# Patient Record
Sex: Male | Born: 1960 | Race: Black or African American | Hispanic: No | Marital: Single | Smoking: Never smoker
Health system: Southern US, Community
[De-identification: ages and names within clinical notes are randomized; demographics above are authoritative.]

## PROBLEM LIST (undated history)

## (undated) DIAGNOSIS — C801 Malignant (primary) neoplasm, unspecified: Secondary | ICD-10-CM

## (undated) DIAGNOSIS — R569 Unspecified convulsions: Secondary | ICD-10-CM

## (undated) DIAGNOSIS — F319 Bipolar disorder, unspecified: Secondary | ICD-10-CM

## (undated) DIAGNOSIS — I10 Essential (primary) hypertension: Secondary | ICD-10-CM

## (undated) DIAGNOSIS — N189 Chronic kidney disease, unspecified: Secondary | ICD-10-CM

## (undated) DIAGNOSIS — F419 Anxiety disorder, unspecified: Secondary | ICD-10-CM

## (undated) DIAGNOSIS — R4189 Other symptoms and signs involving cognitive functions and awareness: Secondary | ICD-10-CM

---

## 2021-03-01 ENCOUNTER — Telehealth: Payer: Self-pay | Admitting: Family Medicine

## 2021-03-01 NOTE — Telephone Encounter (Signed)
error 

## 2021-04-20 ENCOUNTER — Encounter (HOSPITAL_COMMUNITY): Payer: Self-pay

## 2021-04-20 ENCOUNTER — Emergency Department (HOSPITAL_COMMUNITY): Payer: Medicaid Other

## 2021-04-20 ENCOUNTER — Emergency Department (HOSPITAL_COMMUNITY)
Admission: EM | Admit: 2021-04-20 | Discharge: 2021-04-20 | Disposition: A | Discharge status: Home / Self Care | Payer: Medicaid Other | Attending: Emergency Medicine | Admitting: Emergency Medicine

## 2021-04-20 DIAGNOSIS — R197 Diarrhea, unspecified: Secondary | ICD-10-CM | POA: Insufficient documentation

## 2021-04-20 DIAGNOSIS — Z85828 Personal history of other malignant neoplasm of skin: Secondary | ICD-10-CM | POA: Insufficient documentation

## 2021-04-20 DIAGNOSIS — N189 Chronic kidney disease, unspecified: Secondary | ICD-10-CM | POA: Diagnosis not present

## 2021-04-20 DIAGNOSIS — K5900 Constipation, unspecified: Secondary | ICD-10-CM | POA: Insufficient documentation

## 2021-04-20 DIAGNOSIS — I129 Hypertensive chronic kidney disease with stage 1 through stage 4 chronic kidney disease, or unspecified chronic kidney disease: Secondary | ICD-10-CM | POA: Insufficient documentation

## 2021-04-20 HISTORY — DX: Bipolar disorder, unspecified: F31.9

## 2021-04-20 HISTORY — DX: Anxiety disorder, unspecified: F41.9

## 2021-04-20 HISTORY — DX: Chronic kidney disease, unspecified: N18.9

## 2021-04-20 HISTORY — DX: Malignant (primary) neoplasm, unspecified: C80.1

## 2021-04-20 HISTORY — DX: Essential (primary) hypertension: I10

## 2021-04-20 HISTORY — DX: Unspecified convulsions: R56.9

## 2021-04-20 LAB — CBC WITH DIFFERENTIAL/PLATELET
Abs Immature Granulocytes: 0.01 10*3/uL (ref 0.00–0.07)
Basophils Absolute: 0 10*3/uL (ref 0.0–0.1)
Basophils Relative: 1 %
Eosinophils Absolute: 0.1 10*3/uL (ref 0.0–0.5)
Eosinophils Relative: 2 %
HCT: 42.1 % (ref 39.0–52.0)
Hemoglobin: 13.4 g/dL (ref 13.0–17.0)
Immature Granulocytes: 0 %
Lymphocytes Relative: 34 %
Lymphs Abs: 1.8 10*3/uL (ref 0.7–4.0)
MCH: 29.6 pg (ref 26.0–34.0)
MCHC: 31.8 g/dL (ref 30.0–36.0)
MCV: 92.9 fL (ref 80.0–100.0)
Monocytes Absolute: 0.5 10*3/uL (ref 0.1–1.0)
Monocytes Relative: 8 %
Neutro Abs: 2.9 10*3/uL (ref 1.7–7.7)
Neutrophils Relative %: 55 %
Platelets: 319 10*3/uL (ref 150–400)
RBC: 4.53 MIL/uL (ref 4.22–5.81)
RDW: 14.9 % (ref 11.5–15.5)
WBC: 5.3 10*3/uL (ref 4.0–10.5)
nRBC: 0 % (ref 0.0–0.2)

## 2021-04-20 LAB — URINALYSIS, ROUTINE W REFLEX MICROSCOPIC
Bilirubin Urine: NEGATIVE
Glucose, UA: NEGATIVE mg/dL
Hgb urine dipstick: NEGATIVE
Ketones, ur: NEGATIVE mg/dL
Leukocytes,Ua: NEGATIVE
Nitrite: NEGATIVE
Protein, ur: NEGATIVE mg/dL
Specific Gravity, Urine: 1.008 (ref 1.005–1.030)
pH: 7 (ref 5.0–8.0)

## 2021-04-20 LAB — COMPREHENSIVE METABOLIC PANEL
ALT: 25 U/L (ref 0–44)
AST: 27 U/L (ref 15–41)
Albumin: 4.2 g/dL (ref 3.5–5.0)
Alkaline Phosphatase: 153 U/L — ABNORMAL HIGH (ref 38–126)
Anion gap: 8 (ref 5–15)
BUN: 24 mg/dL — ABNORMAL HIGH (ref 8–23)
CO2: 24 mmol/L (ref 22–32)
Calcium: 8.8 mg/dL — ABNORMAL LOW (ref 8.9–10.3)
Chloride: 105 mmol/L (ref 98–111)
Creatinine, Ser: 1.57 mg/dL — ABNORMAL HIGH (ref 0.61–1.24)
GFR, Estimated: 50 mL/min — ABNORMAL LOW (ref >60)
Glucose, Bld: 95 mg/dL (ref 70–99)
Potassium: 5 mmol/L (ref 3.5–5.1)
Sodium: 137 mmol/L (ref 135–145)
Total Bilirubin: 0.4 mg/dL (ref 0.3–1.2)
Total Protein: 8 g/dL (ref 6.5–8.1)

## 2021-04-20 LAB — LIPASE, BLOOD: Lipase: 41 U/L (ref 11–51)

## 2021-04-20 LAB — LITHIUM LEVEL: Lithium Lvl: 0.99 mmol/L (ref 0.60–1.20)

## 2021-04-20 LAB — VALPROIC ACID LEVEL: Valproic Acid Lvl: 35 ug/mL — ABNORMAL LOW (ref 50.0–100.0)

## 2021-04-20 MED ORDER — SODIUM CHLORIDE 0.9 % IV BOLUS
1000.0000 mL | Freq: Once | INTRAVENOUS | Status: AC
  Administered 2021-04-20: 14:00:00 1000 mL via INTRAVENOUS

## 2021-04-20 MED ORDER — FLEET ENEMA 7-19 GM/118ML RE ENEM
1.0000 | ENEMA | Freq: Once | RECTAL | Status: AC
  Administered 2021-04-20: 15:00:00 1 via RECTAL
  Filled 2021-04-20: qty 1

## 2021-04-20 MED ORDER — POLYETHYLENE GLYCOL 3350 17 G PO PACK: 17.0000 g | PACK | Freq: Every day | ORAL | 0 refills | Status: AC | PRN

## 2021-04-20 NOTE — ED Triage Notes (Signed)
Pt presents with c/o diarrhea. Visitor at bedside reports that they were sent over reference possible low lithium levels.

## 2021-04-20 NOTE — ED Provider Notes (Signed)
[facility] DEPT Provider Note   CSN: [id number] Arrival date & time: 04/20/21  1057     History Chief Complaint  Patient presents with   Diarrhea    Nicholas Oconnell is a 60 y.o. male.  Pt presents to the ED today with diarrhea.  Pt is with his group home worker who provides the hx.  Pt was taken to [facility] for the diarrhea.  They sent him here for further eval.  She said they were concerned about low lithium levels.  Pt denies any pain now.  He has not had f/c.  No n/v.      Past Medical History:  Diagnosis Date   Anxiety    Bipolar affective ([place])    Cancer ([place])    Chronic kidney disease (CKD)    Hypertension    Seizure ([place])     There are no problems to display for this patient.   History reviewed. No pertinent surgical history.     History reviewed. No pertinent family history.  Social History   Tobacco Use   Smoking status: Never   Smokeless tobacco: Never    Home Medications Prior to Admission medications   Medication Sig Start Date End Date Taking? Authorizing Provider  polyethylene glycol (MIRALAX) 17 g packet Take 17 g by mouth daily as needed for moderate constipation. 04/20/21 05/20/21 Yes Isla Pence, MD    Allergies    Patient has no known allergies.  Review of Systems   Review of Systems  Gastrointestinal:  Positive for diarrhea.  All other systems reviewed and are negative.  Physical Exam Updated Vital Signs BP (!) 150/93   Pulse 75   Temp 98.6 F (37 C) (Oral)   Resp 18   Ht 5\' 6"  (1.676 m)   Wt 64 kg   SpO2 99%   BMI 22.76 kg/m   Physical Exam Vitals and nursing note reviewed.  Constitutional:      Appearance: Normal appearance.  HENT:     Head: Normocephalic and atraumatic.     Right Ear: External ear normal.     Left Ear: External ear normal.     Nose: Nose normal.     Mouth/Throat:     Mouth: Mucous membranes are moist.     Pharynx: Oropharynx is clear.  Eyes:     Extraocular  Movements: Extraocular movements intact.     Conjunctiva/sclera: Conjunctivae normal.     Pupils: Pupils are equal, round, and reactive to light.  Cardiovascular:     Rate and Rhythm: Normal rate and regular rhythm.     Pulses: Normal pulses.     Heart sounds: Normal heart sounds.  Pulmonary:     Effort: Pulmonary effort is normal.     Breath sounds: Normal breath sounds.  Abdominal:     General: Abdomen is flat. Bowel sounds are decreased.     Palpations: Abdomen is soft.  Musculoskeletal:        General: Normal range of motion.     Cervical back: Normal range of motion and neck supple.  Skin:    General: Skin is warm.     Capillary Refill: Capillary refill takes less than 2 seconds.  Neurological:     General: No focal deficit present.     Mental Status: He is alert. Mental status is at baseline.  Psychiatric:        Mood and Affect: Mood normal.    ED Results / Procedures / Treatments   Labs (all  labs ordered are listed, but only abnormal results are displayed) Labs Reviewed  COMPREHENSIVE METABOLIC PANEL - Abnormal; Notable for the following components:      Result Value   BUN 24 (*)    Creatinine, Ser 1.57 (*)    Calcium 8.8 (*)    Alkaline Phosphatase 153 (*)    GFR, Estimated 50 (*)    All other components within normal limits  URINALYSIS, ROUTINE W REFLEX MICROSCOPIC - Abnormal; Notable for the following components:   Color, Urine STRAW (*)    All other components within normal limits  VALPROIC ACID LEVEL - Abnormal; Notable for the following components:   Valproic Acid Lvl 35 (*)    All other components within normal limits  CBC WITH DIFFERENTIAL/PLATELET  LIPASE, BLOOD  LITHIUM LEVEL    EKG None  Radiology DG Abdomen Acute W/Chest  Result Date: 04/20/2021 CLINICAL DATA:  Abdominal pain EXAM: DG ABDOMEN ACUTE WITH 1 VIEW CHEST COMPARISON:  None. FINDINGS: There is gaseous distension of the right colon and transverse colon which measures up to 11.1 cm.  The left colon up to the rectum is normal in caliber with a large stool burden. A few air-fluid levels are identified within the upper abdomen. Multiple surgical clips are seen within the left hemiabdomen. Lungs are clear. Heart size is normal. IMPRESSION: 1. Gaseous distension of the right colon and transverse may, which may be due to large stool burden versus colonic obstruction in the distal transverse colon. Consider further evaluation with CT of the abdomen pelvis with oral and IV contrast material. 2. No acute cardiopulmonary abnormalities. Electronically Signed   By: Kerby Moors M.D.   On: 04/20/2021 12:29    Procedures Procedures   Medications Ordered in ED Medications  sodium chloride 0.9 % bolus 1,000 mL (1,000 mLs Intravenous New Bag/Given 04/20/21 1343)  sodium phosphate (FLEET) 7-19 GM/118ML enema 1 enema (1 enema Rectal Given 04/20/21 1518)    ED Course  I have reviewed the triage vital signs and the nursing notes.  Pertinent labs & imaging results that were available during my care of the patient were reviewed by me and considered in my medical decision making (see chart for details).    MDM Rules/Calculators/A&P                           Pt had a fleet's enema and had good results.  Pt is feeling better.  He is stable for d/c.  Return if worse.  Final Clinical Impression(s) / ED Diagnoses Final diagnoses:  Constipation, unspecified constipation type    Rx / DC Orders ED Discharge Orders          Ordered    polyethylene glycol (MIRALAX) 17 g packet  Daily PRN        04/20/21 1541             Isla Pence, MD 04/20/21 1548

## 2021-04-28 ENCOUNTER — Other Ambulatory Visit: Payer: Self-pay

## 2021-04-28 ENCOUNTER — Emergency Department (HOSPITAL_COMMUNITY)
Admission: EM | Admit: 2021-04-28 | Discharge: 2021-04-28 | Disposition: A | Discharge status: Home / Self Care | Payer: Medicaid Other | Attending: Emergency Medicine | Admitting: Emergency Medicine

## 2021-04-28 ENCOUNTER — Encounter (HOSPITAL_COMMUNITY): Payer: Self-pay

## 2021-04-28 DIAGNOSIS — Z859 Personal history of malignant neoplasm, unspecified: Secondary | ICD-10-CM | POA: Diagnosis not present

## 2021-04-28 DIAGNOSIS — Z79899 Other long term (current) drug therapy: Secondary | ICD-10-CM | POA: Insufficient documentation

## 2021-04-28 DIAGNOSIS — I129 Hypertensive chronic kidney disease with stage 1 through stage 4 chronic kidney disease, or unspecified chronic kidney disease: Secondary | ICD-10-CM | POA: Diagnosis not present

## 2021-04-28 DIAGNOSIS — Y900 Blood alcohol level of less than 20 mg/100 ml: Secondary | ICD-10-CM | POA: Diagnosis not present

## 2021-04-28 DIAGNOSIS — N189 Chronic kidney disease, unspecified: Secondary | ICD-10-CM | POA: Diagnosis not present

## 2021-04-28 DIAGNOSIS — R350 Frequency of micturition: Secondary | ICD-10-CM | POA: Diagnosis not present

## 2021-04-28 LAB — SALICYLATE LEVEL: Salicylate Lvl: <7 mg/dL — ABNORMAL LOW (ref 7.0–30.0)

## 2021-04-28 LAB — URINALYSIS, ROUTINE W REFLEX MICROSCOPIC
Bilirubin Urine: NEGATIVE
Glucose, UA: NEGATIVE mg/dL
Hgb urine dipstick: NEGATIVE
Ketones, ur: NEGATIVE mg/dL
Leukocytes,Ua: NEGATIVE
Nitrite: NEGATIVE
Protein, ur: NEGATIVE mg/dL
Specific Gravity, Urine: 1.011 (ref 1.005–1.030)
pH: 5 (ref 5.0–8.0)

## 2021-04-28 LAB — COMPREHENSIVE METABOLIC PANEL
ALT: 42 U/L (ref 0–44)
AST: 45 U/L — ABNORMAL HIGH (ref 15–41)
Albumin: 3.8 g/dL (ref 3.5–5.0)
Alkaline Phosphatase: 157 U/L — ABNORMAL HIGH (ref 38–126)
Anion gap: 8 (ref 5–15)
BUN: 17 mg/dL (ref 8–23)
CO2: 23 mmol/L (ref 22–32)
Calcium: 8.5 mg/dL — ABNORMAL LOW (ref 8.9–10.3)
Chloride: 110 mmol/L (ref 98–111)
Creatinine, Ser: 1.55 mg/dL — ABNORMAL HIGH (ref 0.61–1.24)
GFR, Estimated: 51 mL/min — ABNORMAL LOW (ref >60)
Glucose, Bld: 105 mg/dL — ABNORMAL HIGH (ref 70–99)
Potassium: 5.2 mmol/L — ABNORMAL HIGH (ref 3.5–5.1)
Sodium: 141 mmol/L (ref 135–145)
Total Bilirubin: 0.3 mg/dL (ref 0.3–1.2)
Total Protein: 7.5 g/dL (ref 6.5–8.1)

## 2021-04-28 LAB — CBC WITH DIFFERENTIAL/PLATELET
Abs Immature Granulocytes: 0.04 10*3/uL (ref 0.00–0.07)
Basophils Absolute: 0.1 10*3/uL (ref 0.0–0.1)
Basophils Relative: 1 %
Eosinophils Absolute: 0.1 10*3/uL (ref 0.0–0.5)
Eosinophils Relative: 2 %
HCT: 38.7 % — ABNORMAL LOW (ref 39.0–52.0)
Hemoglobin: 12.4 g/dL — ABNORMAL LOW (ref 13.0–17.0)
Immature Granulocytes: 1 %
Lymphocytes Relative: 36 %
Lymphs Abs: 2.3 10*3/uL (ref 0.7–4.0)
MCH: 29.9 pg (ref 26.0–34.0)
MCHC: 32 g/dL (ref 30.0–36.0)
MCV: 93.3 fL (ref 80.0–100.0)
Monocytes Absolute: 0.7 10*3/uL (ref 0.1–1.0)
Monocytes Relative: 10 %
Neutro Abs: 3.2 10*3/uL (ref 1.7–7.7)
Neutrophils Relative %: 50 %
Platelets: 272 10*3/uL (ref 150–400)
RBC: 4.15 MIL/uL — ABNORMAL LOW (ref 4.22–5.81)
RDW: 14.7 % (ref 11.5–15.5)
WBC: 6.4 10*3/uL (ref 4.0–10.5)
nRBC: 0 % (ref 0.0–0.2)

## 2021-04-28 LAB — ETHANOL: Alcohol, Ethyl (B): <10 mg/dL (ref <10)

## 2021-04-28 LAB — LITHIUM LEVEL: Lithium Lvl: 0.82 mmol/L (ref 0.60–1.20)

## 2021-04-28 LAB — VALPROIC ACID LEVEL: Valproic Acid Lvl: 17 ug/mL — ABNORMAL LOW (ref 50.0–100.0)

## 2021-04-28 LAB — ACETAMINOPHEN LEVEL: Acetaminophen (Tylenol), Serum: <10 ug/mL — ABNORMAL LOW (ref 10–30)

## 2021-04-28 MED ORDER — DIVALPROEX SODIUM ER 500 MG PO TB24: 1000.0000 mg | ORAL_TABLET | Freq: Every day | ORAL | 0 refills | Status: DC

## 2021-04-28 NOTE — ED Provider Notes (Signed)
Emergency Medicine Provider Triage Evaluation Note  Nicholas Oconnell , a 60 y.o. male  was evaluated in triage.  Pt complains of patient here from assisted living facility.  Paperwork states they feel patient is manic.  They are requesting Depakote and lithium level.  They also think patient has had frequent urination as well as increased hunger.  Patient denies any complaints aside from urinating frequency however denies any dysuria.  Denies SI, HI, AVH  Review of Systems  Positive: Increased hunger, polyuria Negative: Fever, emesis, abdominal pain, diarrhea   Physical Exam  There were no vitals taken for this visit. Gen:   Awake, no distress   Resp:  Normal effort  MSK:   Moves extremities without difficulty  Psych:  Pleasant, follows commands, denies SI, HI Other:    Medical Decision Making  Medically screening exam initiated at 6:34 PM.  Appropriate orders placed.  Nicholas Oconnell was informed that the remainder of the evaluation will be completed by another provider, this initial triage assessment does not replace that evaluation, and the importance of remaining in the ED until their evaluation is complete.  Facility requesting labs, they feel patient is manic per paper work.  Patient is pleasant, follow commands.  Denies any SI, HI, AVH.  We will plan on checking labs, urine given polyuria and reassess   Braylen Staller A, PA-C 04/28/21 1836    Hayden Rasmussen, MD 04/29/21 1048

## 2021-04-28 NOTE — Discharge Instructions (Signed)
Increase Depakote to 1000 mg every night.

## 2021-04-28 NOTE — ED Provider Notes (Signed)
[facility] DEPT Provider Note   CSN: [id number] Arrival date & time: 04/28/21  1821     History Chief Complaint  Patient presents with   Urinary Frequency    Nicholas Oconnell is a 60 y.o. male.  Pt presents to the ED today with requests for depakote and lithium level as well as evaluation for mania.  I saw this patient on 11/17 for diarrhea.  Pt actually had constipation and was treated with an enema.  He had a large bowel movement.  His facility requested depakote and lithium levels then as well.  Depakote was 35 and Lithium was 0.99.  The facility thinks he's had increased urination.  However, his aide thinks he's been going to the bathroom frequently to have bowel movements.  Pt is unable to tell me.      Past Medical History:  Diagnosis Date   Anxiety    Bipolar affective ([place])    Cancer ([place])    Chronic kidney disease (CKD)    Hypertension    Seizure ([place])     There are no problems to display for this patient.   History reviewed. No pertinent surgical history.     No family history on file.  Social History   Tobacco Use   Smoking status: Never   Smokeless tobacco: Never  Vaping Use   Vaping Use: Never used  Substance Use Topics   Alcohol use: Never   Drug use: Never    Home Medications Prior to Admission medications   Medication Sig Start Date End Date Taking? Authorizing Provider  divalproex (DEPAKOTE ER) 500 MG 24 hr tablet Take 2 tablets (1,000 mg total) by mouth at bedtime. 04/28/21  Yes Isla Pence, MD  polyethylene glycol (MIRALAX) 17 g packet Take 17 g by mouth daily as needed for moderate constipation. 04/20/21 05/20/21  Isla Pence, MD    Allergies    Patient has no known allergies.  Review of Systems   Review of Systems  Genitourinary:  Positive for frequency.  Psychiatric/Behavioral:         ? mania  All other systems reviewed and are negative.  Physical Exam Updated Vital Signs BP (!) 150/92  (BP Location: Right Arm)   Pulse 72   Temp 98 F (36.7 C) (Oral)   Resp 16   Ht 5\' 6"  (1.676 m)   Wt 64 kg   SpO2 100%   BMI 22.76 kg/m   Physical Exam Vitals and nursing note reviewed.  Constitutional:      Appearance: Normal appearance.  HENT:     Head: Normocephalic and atraumatic.     Right Ear: External ear normal.     Left Ear: External ear normal.     Nose: Nose normal.     Mouth/Throat:     Mouth: Mucous membranes are dry.  Eyes:     Extraocular Movements: Extraocular movements intact.     Conjunctiva/sclera: Conjunctivae normal.     Pupils: Pupils are equal, round, and reactive to light.  Cardiovascular:     Rate and Rhythm: Normal rate and regular rhythm.     Pulses: Normal pulses.     Heart sounds: Normal heart sounds.  Pulmonary:     Effort: Pulmonary effort is normal.     Breath sounds: Normal breath sounds.  Abdominal:     General: Abdomen is flat. Bowel sounds are normal.     Palpations: Abdomen is soft.  Musculoskeletal:        General: Normal  range of motion.     Cervical back: Normal range of motion and neck supple.  Skin:    General: Skin is warm.     Capillary Refill: Capillary refill takes less than 2 seconds.  Neurological:     Mental Status: He is alert. Mental status is at baseline.  Psychiatric:        Mood and Affect: Mood normal.        Behavior: Behavior normal.        Thought Content: Thought content normal.        Judgment: Judgment normal.    ED Results / Procedures / Treatments   Labs (all labs ordered are listed, but only abnormal results are displayed) Labs Reviewed  CBC WITH DIFFERENTIAL/PLATELET - Abnormal; Notable for the following components:      Result Value   RBC 4.15 (*)    Hemoglobin 12.4 (*)    HCT 38.7 (*)    All other components within normal limits  COMPREHENSIVE METABOLIC PANEL - Abnormal; Notable for the following components:   Potassium 5.2 (*)    Glucose, Bld 105 (*)    Creatinine, Ser 1.55 (*)     Calcium 8.5 (*)    AST 45 (*)    Alkaline Phosphatase 157 (*)    GFR, Estimated 51 (*)    All other components within normal limits  VALPROIC ACID LEVEL - Abnormal; Notable for the following components:   Valproic Acid Lvl 17 (*)    All other components within normal limits  URINALYSIS, ROUTINE W REFLEX MICROSCOPIC - Abnormal; Notable for the following components:   Color, Urine STRAW (*)    All other components within normal limits  SALICYLATE LEVEL - Abnormal; Notable for the following components:   Salicylate Lvl <1.0 (*)    All other components within normal limits  ACETAMINOPHEN LEVEL - Abnormal; Notable for the following components:   Acetaminophen (Tylenol), Serum <10 (*)    All other components within normal limits  LITHIUM LEVEL  ETHANOL  CBG MONITORING, ED    EKG None  Radiology No results found.  Procedures Procedures   Medications Ordered in ED Medications - No data to display  ED Course  I have reviewed the triage vital signs and the nursing notes.  Pertinent labs & imaging results that were available during my care of the patient were reviewed by me and considered in my medical decision making (see chart for details).    MDM Rules/Calculators/A&P                           Pt is perfectly calm here.  Lithium level is normal.  Depakote level is 17, but pt only takes 500 mg at night.  We can increase that to 1000 mg nightly.  Urine nl.  No fever.  No pain.  Pt has remained calm.  He has eaten and drank.  He is stable for d/c.  Return if worse.  Final Clinical Impression(s) / ED Diagnoses Final diagnoses:  Urinary frequency    Rx / DC Orders ED Discharge Orders          Ordered    divalproex (DEPAKOTE ER) 500 MG 24 hr tablet  Daily at bedtime        04/28/21 2213             Isla Pence, MD 04/28/21 2216

## 2021-04-28 NOTE — ED Triage Notes (Signed)
Patient is from [facility].  Notes from nurse at the group home states, Urinary frequency, increased hunger, restlessness, fell of energy, manic state, needs labs for Depakote and lithium

## 2021-06-28 ENCOUNTER — Emergency Department (HOSPITAL_COMMUNITY): Payer: Medicaid Other

## 2021-06-28 ENCOUNTER — Encounter (HOSPITAL_COMMUNITY): Payer: Self-pay

## 2021-06-28 ENCOUNTER — Other Ambulatory Visit: Payer: Self-pay

## 2021-06-28 ENCOUNTER — Inpatient Hospital Stay (HOSPITAL_COMMUNITY)
Admission: EM | Admit: 2021-06-28 | Discharge: 2021-07-08 | DRG: 391 | Disposition: A | Discharge status: Home / Self Care | Payer: Medicaid Other | Attending: Internal Medicine | Admitting: Internal Medicine

## 2021-06-28 DIAGNOSIS — F79 Unspecified intellectual disabilities: Secondary | ICD-10-CM | POA: Diagnosis present

## 2021-06-28 DIAGNOSIS — K5981 Ogilvie syndrome: Secondary | ICD-10-CM | POA: Diagnosis present

## 2021-06-28 DIAGNOSIS — K2091 Esophagitis, unspecified with bleeding: Secondary | ICD-10-CM | POA: Diagnosis present

## 2021-06-28 DIAGNOSIS — Z9049 Acquired absence of other specified parts of digestive tract: Secondary | ICD-10-CM | POA: Diagnosis not present

## 2021-06-28 DIAGNOSIS — J690 Pneumonitis due to inhalation of food and vomit: Secondary | ICD-10-CM | POA: Diagnosis present

## 2021-06-28 DIAGNOSIS — K922 Gastrointestinal hemorrhage, unspecified: Secondary | ICD-10-CM | POA: Diagnosis not present

## 2021-06-28 DIAGNOSIS — Z4659 Encounter for fitting and adjustment of other gastrointestinal appliance and device: Secondary | ICD-10-CM

## 2021-06-28 DIAGNOSIS — I129 Hypertensive chronic kidney disease with stage 1 through stage 4 chronic kidney disease, or unspecified chronic kidney disease: Secondary | ICD-10-CM | POA: Diagnosis present

## 2021-06-28 DIAGNOSIS — Z781 Physical restraint status: Secondary | ICD-10-CM

## 2021-06-28 DIAGNOSIS — K920 Hematemesis: Secondary | ICD-10-CM

## 2021-06-28 DIAGNOSIS — R059 Cough, unspecified: Secondary | ICD-10-CM

## 2021-06-28 DIAGNOSIS — Z79899 Other long term (current) drug therapy: Secondary | ICD-10-CM | POA: Diagnosis not present

## 2021-06-28 DIAGNOSIS — R140 Abdominal distension (gaseous): Secondary | ICD-10-CM

## 2021-06-28 DIAGNOSIS — Z20822 Contact with and (suspected) exposure to covid-19: Secondary | ICD-10-CM | POA: Diagnosis present

## 2021-06-28 DIAGNOSIS — N1831 Chronic kidney disease, stage 3a: Secondary | ICD-10-CM | POA: Diagnosis present

## 2021-06-28 DIAGNOSIS — G9340 Encephalopathy, unspecified: Secondary | ICD-10-CM | POA: Diagnosis present

## 2021-06-28 DIAGNOSIS — D1771 Benign lipomatous neoplasm of kidney: Secondary | ICD-10-CM | POA: Diagnosis present

## 2021-06-28 DIAGNOSIS — F250 Schizoaffective disorder, bipolar type: Secondary | ICD-10-CM | POA: Diagnosis present

## 2021-06-28 DIAGNOSIS — K5989 Other specified functional intestinal disorders: Principal | ICD-10-CM | POA: Diagnosis present

## 2021-06-28 DIAGNOSIS — K560 Paralytic ileus: Secondary | ICD-10-CM | POA: Diagnosis present

## 2021-06-28 DIAGNOSIS — I517 Cardiomegaly: Secondary | ICD-10-CM

## 2021-06-28 DIAGNOSIS — K567 Ileus, unspecified: Secondary | ICD-10-CM

## 2021-06-28 DIAGNOSIS — F419 Anxiety disorder, unspecified: Secondary | ICD-10-CM | POA: Diagnosis present

## 2021-06-28 DIAGNOSIS — N183 Chronic kidney disease, stage 3 unspecified: Secondary | ICD-10-CM

## 2021-06-28 DIAGNOSIS — K5939 Other megacolon: Secondary | ICD-10-CM | POA: Diagnosis present

## 2021-06-28 DIAGNOSIS — J189 Pneumonia, unspecified organism: Secondary | ICD-10-CM

## 2021-06-28 DIAGNOSIS — R4182 Altered mental status, unspecified: Secondary | ICD-10-CM

## 2021-06-28 HISTORY — DX: Other symptoms and signs involving cognitive functions and awareness: R41.89

## 2021-06-28 LAB — RESP PANEL BY RT-PCR (FLU A&B, COVID) ARPGX2
Influenza A by PCR: NEGATIVE
Influenza B by PCR: NEGATIVE
SARS Coronavirus 2 by RT PCR: NEGATIVE

## 2021-06-28 LAB — CBC WITH DIFFERENTIAL/PLATELET
Abs Immature Granulocytes: 0.05 10*3/uL (ref 0.00–0.07)
Basophils Absolute: 0 10*3/uL (ref 0.0–0.1)
Basophils Relative: 0 %
Eosinophils Absolute: 0 10*3/uL (ref 0.0–0.5)
Eosinophils Relative: 0 %
HCT: 43.4 % (ref 39.0–52.0)
Hemoglobin: 14 g/dL (ref 13.0–17.0)
Immature Granulocytes: 0 %
Lymphocytes Relative: 8 %
Lymphs Abs: 1 10*3/uL (ref 0.7–4.0)
MCH: 29.7 pg (ref 26.0–34.0)
MCHC: 32.3 g/dL (ref 30.0–36.0)
MCV: 91.9 fL (ref 80.0–100.0)
Monocytes Absolute: 0.9 10*3/uL (ref 0.1–1.0)
Monocytes Relative: 7 %
Neutro Abs: 10.8 10*3/uL — ABNORMAL HIGH (ref 1.7–7.7)
Neutrophils Relative %: 85 %
Platelets: 250 10*3/uL (ref 150–400)
RBC: 4.72 MIL/uL (ref 4.22–5.81)
RDW: 15.8 % — ABNORMAL HIGH (ref 11.5–15.5)
WBC: 12.8 10*3/uL — ABNORMAL HIGH (ref 4.0–10.5)
nRBC: 0 % (ref 0.0–0.2)

## 2021-06-28 LAB — TYPE AND SCREEN
ABO/RH(D): AB POS
Antibody Screen: NEGATIVE

## 2021-06-28 LAB — COMPREHENSIVE METABOLIC PANEL
ALT: 17 U/L (ref 0–44)
AST: 23 U/L (ref 15–41)
Albumin: 4.3 g/dL (ref 3.5–5.0)
Alkaline Phosphatase: 150 U/L — ABNORMAL HIGH (ref 38–126)
Anion gap: 11 (ref 5–15)
BUN: 18 mg/dL (ref 8–23)
CO2: 21 mmol/L — ABNORMAL LOW (ref 22–32)
Calcium: 9.3 mg/dL (ref 8.9–10.3)
Chloride: 107 mmol/L (ref 98–111)
Creatinine, Ser: 1.84 mg/dL — ABNORMAL HIGH (ref 0.61–1.24)
GFR, Estimated: 41 mL/min — ABNORMAL LOW (ref >60)
Glucose, Bld: 124 mg/dL — ABNORMAL HIGH (ref 70–99)
Potassium: 4.6 mmol/L (ref 3.5–5.1)
Sodium: 139 mmol/L (ref 135–145)
Total Bilirubin: 0.7 mg/dL (ref 0.3–1.2)
Total Protein: 8.3 g/dL — ABNORMAL HIGH (ref 6.5–8.1)

## 2021-06-28 LAB — TROPONIN I (HIGH SENSITIVITY): Troponin I (High Sensitivity): 8 ng/L (ref <18)

## 2021-06-28 LAB — LACTIC ACID, PLASMA: Lactic Acid, Venous: 1.8 mmol/L (ref 0.5–1.9)

## 2021-06-28 LAB — PROTIME-INR
INR: 1 (ref 0.8–1.2)
Prothrombin Time: 13 seconds (ref 11.4–15.2)

## 2021-06-28 LAB — LIPASE, BLOOD: Lipase: 33 U/L (ref 11–51)

## 2021-06-28 LAB — VALPROIC ACID LEVEL: Valproic Acid Lvl: 54 ug/mL (ref 50.0–100.0)

## 2021-06-28 MED ORDER — ONDANSETRON HCL 4 MG/2ML IJ SOLN
4.0000 mg | Freq: Once | INTRAMUSCULAR | Status: AC
  Administered 2021-06-28: 20:00:00 4 mg via INTRAVENOUS
  Filled 2021-06-28: qty 2

## 2021-06-28 MED ORDER — PANTOPRAZOLE SODIUM 40 MG IV SOLR
40.0000 mg | Freq: Once | INTRAVENOUS | Status: AC
  Administered 2021-06-28: 20:00:00 40 mg via INTRAVENOUS
  Filled 2021-06-28: qty 40

## 2021-06-28 MED ORDER — SODIUM CHLORIDE 0.9% FLUSH
3.0000 mL | Freq: Two times a day (BID) | INTRAVENOUS | Status: DC
  Administered 2021-06-28 – 2021-07-06 (×15): 3 mL via INTRAVENOUS

## 2021-06-28 MED ORDER — PANTOPRAZOLE INFUSION (NEW) - SIMPLE MED
8.0000 mg/h | INTRAVENOUS | Status: DC
  Administered 2021-06-28: 22:00:00 8 mg/h via INTRAVENOUS
  Filled 2021-06-28: qty 100
  Filled 2021-06-28: qty 80

## 2021-06-28 MED ORDER — PANTOPRAZOLE SODIUM 40 MG IV SOLR
40.0000 mg | Freq: Once | INTRAVENOUS | Status: AC
  Administered 2021-06-28: 22:00:00 40 mg via INTRAVENOUS
  Filled 2021-06-28: qty 40

## 2021-06-28 MED ORDER — SODIUM CHLORIDE 0.9 % IV SOLN: INTRAVENOUS | Status: AC

## 2021-06-28 MED ORDER — ACETAMINOPHEN 650 MG RE SUPP: 650.0000 mg | Freq: Four times a day (QID) | RECTAL | Status: DC | PRN

## 2021-06-28 MED ORDER — IOHEXOL 350 MG/ML SOLN
70.0000 mL | Freq: Once | INTRAVENOUS | Status: AC | PRN
  Administered 2021-06-28: 21:00:00 70 mL via INTRAVENOUS

## 2021-06-28 MED ORDER — SODIUM CHLORIDE 0.9 % IV SOLN
500.0000 mg | INTRAVENOUS | Status: AC
  Administered 2021-06-29 – 2021-07-02 (×5): 500 mg via INTRAVENOUS
  Filled 2021-06-28 (×5): qty 5

## 2021-06-28 MED ORDER — SODIUM CHLORIDE 0.9 % IV BOLUS
500.0000 mL | Freq: Once | INTRAVENOUS | Status: AC
  Administered 2021-06-28: 20:00:00 500 mL via INTRAVENOUS

## 2021-06-28 MED ORDER — ONDANSETRON HCL 4 MG PO TABS: 4.0000 mg | ORAL_TABLET | Freq: Four times a day (QID) | ORAL | Status: DC | PRN

## 2021-06-28 MED ORDER — SODIUM CHLORIDE 0.9 % IV SOLN
2.0000 g | INTRAVENOUS | Status: AC
  Administered 2021-06-29 – 2021-07-02 (×5): 2 g via INTRAVENOUS
  Filled 2021-06-28 (×5): qty 20

## 2021-06-28 MED ORDER — ACETAMINOPHEN 325 MG PO TABS: 650.0000 mg | ORAL_TABLET | Freq: Four times a day (QID) | ORAL | Status: DC | PRN

## 2021-06-28 MED ORDER — ONDANSETRON HCL 4 MG/2ML IJ SOLN
4.0000 mg | Freq: Four times a day (QID) | INTRAMUSCULAR | Status: DC | PRN
  Administered 2021-06-30: 4 mg via INTRAVENOUS
  Filled 2021-06-28: qty 2

## 2021-06-28 NOTE — H&P (Signed)
History and Physical    Lamonte Hartt EAV:[id number] DOB: Feb 24, 1961 DOA: 06/28/2021  PCP: Neale Burly, MD   Patient coming from: Group home   Chief Complaint: Lethargy, hematemesis   HPI: Ejay Lashley is a pleasant 61 y.o. male with medical history significant for schizoaffective disorder, CKD stage III, hypertension, Ogilvie syndrome, history of partial colectomy, now presenting to the emergency department with lethargy and hematemesis.  The patient is reported to be alert and interactive at his baseline but for the past 2 days has been lethargic and not speaking.  He was also noted to have an episode of vomiting with blood today, prompting presentation to the ED.  ED Course: Upon arrival to the ED, patient is found to be afebrile, saturating well on room air, and with stable blood pressure.  EKG features sinus rhythm with LVH.  Chest x-ray notable for interval enlargement of the cardiac silhouette as well as hazy Structure in the left upper quadrant and possible air distention of the esophagus.  CT head is negative for acute intracranial abnormality.  CT of the abdomen and pelvis demonstrates diffusely fluid-filled bowel with occasional fluid distention but no transition point.  Also noted on CT is nodular airspace disease in the right lower lobe concerning for pneumonia.  Chemistry panel features a creatinine 1.84 and CBC notable for leukocytosis 12,800.  Gastroenterology was consulted by the ED physician and the patient was given IV fluids and IV PPI in the ED.  Patient strongly resisted attempt to place NG tube in the ED.  Review of Systems:  Unable to complete ROS secondary the patient's clinical condition.  Past Medical History:  Diagnosis Date   Anxiety    Bipolar affective ([place])    Cancer ([place])    Chronic kidney disease (CKD)    Cognitive deficits    Hypertension    Seizure ([place])     History reviewed. No pertinent surgical history.  Social History:   reports that he  has never smoked. He has never used smokeless tobacco. He reports that he does not drink alcohol and does not use drugs.  No Known Allergies  History reviewed. No pertinent family history.   Prior to Admission medications   Medication Sig Start Date End Date Taking? Authorizing Provider  divalproex (DEPAKOTE ER) 500 MG 24 hr tablet Take 2 tablets (1,000 mg total) by mouth at bedtime. 04/28/21   Isla Pence, MD    Physical Exam: Vitals:   06/28/21 1942  BP: (!) 141/98  Pulse: 87  Resp: 18  Temp: 97.9 F (36.6 C)  TempSrc: Oral  SpO2: 97%    Constitutional: NAD, calm  Eyes: PERTLA, lids and conjunctivae normal ENMT: Mucous membranes are moist. Posterior pharynx clear of any exudate or lesions.   Neck: supple, no masses  Respiratory: no wheezing, no crackles. No accessory muscle use.  Cardiovascular: S1 & S2 heard, regular rate and rhythm. No extremity edema.   Abdomen: Distended, no guarding or rebound pain. Hypoactive bowel sounds.  Musculoskeletal: no clubbing / cyanosis. No joint deformity upper and lower extremities.   Skin: no significant rashes, lesions, ulcers. Warm, dry, well-perfused. Neurologic: CN 2-12 grossly intact. Moving all extremities. Alert, oriented to person and place.  Psychiatric: Calm. Cooperative.    Labs and Imaging on Admission: I have personally reviewed following labs and imaging studies  CBC: Recent Labs  Lab 06/28/21 2002  WBC 12.8*  NEUTROABS 10.8*  HGB 14.0  HCT 43.4  MCV 91.9  PLT 250  Basic Metabolic Panel: Recent Labs  Lab 06/28/21 2002  NA 139  K 4.6  CL 107  CO2 21*  GLUCOSE 124*  BUN 18  CREATININE 1.84*  CALCIUM 9.3   GFR: CrCl cannot be calculated (Unknown ideal weight.). Liver Function Tests: Recent Labs  Lab 06/28/21 2002  AST 23  ALT 17  ALKPHOS 150*  BILITOT 0.7  PROT 8.3*  ALBUMIN 4.3   Recent Labs  Lab 06/28/21 2002  LIPASE 33   No results for input(s): AMMONIA in the last 168  hours. Coagulation Profile: Recent Labs  Lab 06/28/21 2035  INR 1.0   Cardiac Enzymes: No results for input(s): CKTOTAL, CKMB, CKMBINDEX, TROPONINI in the last 168 hours. BNP (last 3 results) No results for input(s): PROBNP in the last 8760 hours. HbA1C: No results for input(s): HGBA1C in the last 72 hours. CBG: No results for input(s): GLUCAP in the last 168 hours. Lipid Profile: No results for input(s): CHOL, HDL, LDLCALC, TRIG, CHOLHDL, LDLDIRECT in the last 72 hours. Thyroid Function Tests: No results for input(s): TSH, T4TOTAL, FREET4, T3FREE, THYROIDAB in the last 72 hours. Anemia Panel: No results for input(s): VITAMINB12, FOLATE, FERRITIN, TIBC, IRON, RETICCTPCT in the last 72 hours. Urine analysis:    Component Value Date/Time   COLORURINE STRAW (A) 04/28/2021 2130   APPEARANCEUR CLEAR 04/28/2021 2130   LABSPEC 1.011 04/28/2021 2130   PHURINE 5.0 04/28/2021 2130   GLUCOSEU NEGATIVE 04/28/2021 2130   HGBUR NEGATIVE 04/28/2021 2130   BILIRUBINUR NEGATIVE 04/28/2021 2130   [place] NEGATIVE 04/28/2021 2130   PROTEINUR NEGATIVE 04/28/2021 2130   NITRITE NEGATIVE 04/28/2021 2130   LEUKOCYTESUR NEGATIVE 04/28/2021 2130   Sepsis Labs: @LABRCNTIP (procalcitonin:4,lacticidven:4) ) Recent Results (from the past 240 hour(s))  Resp Panel by RT-PCR (Flu A&B, Covid) Nasopharyngeal Swab     Status: None   Collection Time: 06/28/21  8:03 PM   Specimen: Nasopharyngeal Swab; Nasopharyngeal(NP) swabs in vial transport medium  Result Value Ref Range Status   SARS Coronavirus 2 by RT PCR NEGATIVE NEGATIVE Final    Comment: (NOTE) SARS-CoV-2 target nucleic acids are NOT DETECTED.  The SARS-CoV-2 RNA is generally detectable in upper respiratory specimens during the acute phase of infection. The lowest concentration of SARS-CoV-2 viral copies this assay can detect is 138 copies/mL. A negative result does not preclude SARS-Cov-2 infection and should not be used as the sole basis  for treatment or other patient management decisions. A negative result may occur with  improper specimen collection/handling, submission of specimen other than nasopharyngeal swab, presence of viral mutation(s) within the areas targeted by this assay, and inadequate number of viral copies(<138 copies/mL). A negative result must be combined with clinical observations, patient history, and epidemiological information. The expected result is Negative.  Fact Sheet for Patients:  EntrepreneurPulse.com.au  Fact Sheet for Healthcare Providers:  IncredibleEmployment.be  This test is no t yet approved or cleared by the [place] FDA and  has been authorized for detection and/or diagnosis of SARS-CoV-2 by FDA under an Emergency Use Authorization (EUA). This EUA will remain  in effect (meaning this test can be used) for the duration of the COVID-19 declaration under Section 564(b)(1) of the Act, 21 U.S.C.section 360bbb-3(b)(1), unless the authorization is terminated  or revoked sooner.       Influenza A by PCR NEGATIVE NEGATIVE Final   Influenza B by PCR NEGATIVE NEGATIVE Final    Comment: (NOTE) The Xpert Xpress SARS-CoV-2/FLU/RSV plus assay is intended as an aid in the diagnosis of  influenza from Nasopharyngeal swab specimens and should not be used as a sole basis for treatment. Nasal washings and aspirates are unacceptable for Xpert Xpress SARS-CoV-2/FLU/RSV testing.  Fact Sheet for Patients: EntrepreneurPulse.com.au  Fact Sheet for Healthcare Providers: IncredibleEmployment.be  This test is not yet approved or cleared by the [place] FDA and has been authorized for detection and/or diagnosis of SARS-CoV-2 by FDA under an Emergency Use Authorization (EUA). This EUA will remain in effect (meaning this test can be used) for the duration of the COVID-19 declaration under Section 564(b)(1) of the Act, 21  U.S.C. section 360bbb-3(b)(1), unless the authorization is terminated or revoked.  Performed at [facility], [place] [address] [place], [place] [address]      Radiological Exams on Admission: CT Head Wo Contrast  Result Date: 06/28/2021 CLINICAL DATA:  Mental status change, unknown cause EXAM: CT HEAD WITHOUT CONTRAST TECHNIQUE: Contiguous axial images were obtained from the base of the skull through the vertex without intravenous contrast. RADIATION DOSE REDUCTION: This exam was performed according to the departmental dose-optimization program which includes automated exposure control, adjustment of the mA and/or kV according to patient size and/or use of iterative reconstruction technique. COMPARISON:  None. FINDINGS: Brain: No acute intracranial abnormality. Specifically, no hemorrhage, hydrocephalus, mass lesion, acute infarction, or significant intracranial injury. Vascular: No hyperdense vessel or unexpected calcification. Skull: No acute calvarial abnormality. Sinuses/Orbits: No acute findings Other: None IMPRESSION: No acute intracranial abnormality. Electronically Signed   By: Rolm Baptise M.D.   On: 06/28/2021 21:37   CT Abdomen Pelvis W Contrast  Result Date: 06/28/2021 CLINICAL DATA:  Acute abdominal pain. Lethargy. Episode of vomiting. EXAM: CT ABDOMEN AND PELVIS WITH CONTRAST TECHNIQUE: Multidetector CT imaging of the abdomen and pelvis was performed using the standard protocol following bolus administration of intravenous contrast. RADIATION DOSE REDUCTION: This exam was performed according to the departmental dose-optimization program which includes automated exposure control, adjustment of the mA and/or kV according to patient size and/or use of iterative reconstruction technique. CONTRAST:  60mL OMNIPAQUE IOHEXOL 350 MG/ML SOLN COMPARISON:  None. FINDINGS: Lower chest: Dilated fluid-filled distal esophagus. Nodular airspace disease in the right lower lobe measures  19 mm, series 4, image 21. Hepatobiliary: No focal liver abnormality is seen. No gallstones, gallbladder wall thickening, or biliary dilatation. Pancreas: No pancreatic inflammation or evidence of mass. Slight prominence of the proximal pancreatic duct at 3 mm. Spleen: Normal in size without focal abnormality. Adrenals/Urinary Tract: Normal adrenal glands. Mild right renal scarring, query postsurgical change in the mid right kidney. No hydronephrosis. 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 No hydronephrosis. No visualized renal calculi. Mild anterior bladder wall thickening. Stomach/Bowel: Detailed bowel assessment is limited in the absence of enteric contrast and paucity of intra-abdominal fat. Dilated fluid-filled distal esophagus. The stomach is distended with fluid/ingested contents. Fluid-filled duodenum and small bowel. Proximal small bowel is mildly dilated. The distal small bowel is less dilated but also fluid-filled. Enteric sutures in the sigmoid colon with suspected left hemicolectomy. There also enteric sutures about cecum. The ascending and transverse colon are fluid-filled, transverse colon is also gaseous distended. Mild diffuse bowel enhancement. Distal sigmoid colon and rectum are nondistended, equivocal wall thickening. There is no other significant small or large bowel wall thickening or inflammatory change. Appendix not visualized, presumably surgically absent. Scattered mesenteric surgical clips. Vascular/Lymphatic: Normal caliber abdominal aorta. The portal vein is patent. There is no portal venous or mesenteric gas. 7  mm left external iliac node, not enlarged by size criteria. There are no enlarged lymph nodes in the abdomen or pelvis. Reproductive: Prostate is unremarkable. Other: No free air or ascites.  No abdominal wall hernia. Musculoskeletal: There are no acute or suspicious osseous abnormalities. Mild L5-S1 degenerative  disc disease. Lower lumbar facet hypertrophy. IMPRESSION: 1. Diffusely fluid-filled bowel,. There is occasional fluid distension of the GI track including the distal esophagus and stomach, proximal small bowel, ascending and transverse colon. Additional areas of bowel are fluid-filled but less dilated. No discrete transition point to suggest obstruction. Overall findings are suspicious for generalized enterocolitis. 2. Nodular airspace disease in the right lower lobe measuring 19 mm. This is likely pneumonia, although CT follow-up is recommended after course of treatment to ensure resolution. This is not amenable to radiographic follow-up, as it was not seen on chest radiograph earlier today. 3. Mild anterior bladder wall thickening, recommend correlation with urinalysis to exclude urinary tract infection. 4. 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 Mild right renal scarring, query postsurgical change. Electronically Signed   By: Keith Rake M.D.   On: 06/28/2021 21:45   DG Chest Port 1 View  Result Date: 06/28/2021 CLINICAL DATA:  Altered mental status EXAM: PORTABLE CHEST 1 VIEW COMPARISON:  04/20/2021 FINDINGS: Interval moderate to marked cardiomegaly. No pleural effusion, consolidation, or pneumothorax. Hazy gas collection in the left upper quadrant probably reflects dilated stomach. Probable air distension of esophagus. IMPRESSION: 1. Interval cardiomegaly which may be due to chamber enlargement or pericardial effusion 2. Hazy gas structure in the left upper quadrant suspected to represent air distended stomach, there is possible air distension of esophagus. Recommend dedicated abdominal radiographs Electronically Signed   By: Donavan Foil M.D.   On: 06/28/2021 20:50    EKG: Independently reviewed. Sinus rhythm, LVH.   Assessment/Plan   1. Acute GI bleeding  - Presents with lethargy blood in vomit and reported to have large volume of emesis with maroon blood in ED  - He is  hemodynamically stable in ED with initial Hgb 14.0  - GI was consulted by ED physician and IV PPI was started  - Continue NPO and IV Protonix, monitor serial H&H   2. Pseudo-obstruction of bowel  - Noted to have diffusely fluid-filled bowel with areas of distension on CT without transition point  - He has documented hx of multiple admissions for pseudo-obstruction (most recently in June 2022), hx of partial colectomy  - Continue NPO, antiemetics, supportive care for now    3. Pneumonia  - RLL pneumonia noted on CT in ED - Start Rocephin and azithromycin    4. Acute encephalopathy  - Presents with 2 days of lethargy and not speaking; has psychiatric illness and cognitive impairment at baseline   - Appears to be improving in ED  - No acute findings on head CT, likely due to his acute illness  - Check drug levels, TSH, ammonia, RPR, B12, and use delirium precautions    5. Schizoaffective disorder  - He is calm in ED  - Use IV medications if needed until he is able to safely resume oral intake    6. Cardiomegaly  - Interval enlargement of cardiac silhouette noted on CXR in ED  - Check echocardiogram  7. CKD III  - SCr is 1.84 on admission, up from 1.55 in November 2022   - Likely CKD IIIa with acute worsening vs progression to CKD IIIb  - Renally-dose medications, monitor     DVT  prophylaxis: SCDs  Code Status: Full  Level of Care: Level of care: Telemetry Family Communication: Caregiver updated from ED  Disposition Plan:  Patient is from: Group home  Anticipated d/c is to: TBD Anticipated d/c date is: 07/01/21  Patient currently: Pending GI consultation, stable blood counts, tolerance of adequate oral intake, echocardiogram  Consults called: GI  Admission status: Inpatient     Vianne Bulls, MD [facility]  06/28/2021, 10:39 PM

## 2021-06-28 NOTE — ED Triage Notes (Signed)
Pt here with a caregiver from group home. Pt caregiver states pt is usually verbal (pt does have cognitive deficits) and alert. Pt caregiver states pt has been nonverbal and lethargic x2 days and this is not normal for patient. Pt caregiver c/o pt having an episode of emesis today with blood in it. Pt nonverbal with RN when asking questions, pt caregiver states this is not normal for pt.

## 2021-06-28 NOTE — ED Notes (Signed)
Unsuccessful ng tube attempt, pt vomited brown color emesis and was resisting.

## 2021-06-28 NOTE — ED Provider Notes (Signed)
[facility] DEPT Provider Note   CSN: [id number] Arrival date & time: 06/28/21  1922     History  Chief Complaint  Patient presents with   Altered Mental Status   Fatigue   Hemoptysis    Nicholas Oconnell is a 61 y.o. male.  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  He is brought in from his group home for evaluation of 2 days of change in mental status.  He has been less interactive.  Usually is verbal.  Also had 1 episode of vomiting blood.  He complains of abdominal pain and nausea.  The history is provided by a caregiver.  Altered Mental Status Presenting symptoms: behavior changes and lethargy   Most recent episode:  2 days ago Episode history:  Continuous Timing:  Constant Progression:  Unchanged Chronicity:  New Associated symptoms: abdominal pain and vomiting       Home Medications Prior to Admission medications   Medication Sig Start Date End Date Taking? Authorizing Provider  divalproex (DEPAKOTE ER) 500 MG 24 hr tablet Take 2 tablets (1,000 mg total) by mouth at bedtime. 04/28/21   Isla Pence, MD      Allergies    Patient has no known allergies.    Review of Systems   Review of Systems  Unable to perform ROS: Mental status change  Gastrointestinal:  Positive for abdominal pain and vomiting.   Physical Exam Updated Vital Signs BP (!) 141/98    Pulse 87    Temp 97.9 F (36.6 C) (Oral)    Resp 18    SpO2 97%  Physical Exam Vitals and nursing note reviewed.  Constitutional:      General: He is not in acute distress.    Appearance: Normal appearance. He is well-developed.  HENT:     Head: Normocephalic and atraumatic.  Eyes:     Conjunctiva/sclera: Conjunctivae normal.  Cardiovascular:     Rate and Rhythm: Normal rate and regular rhythm.     Heart sounds: No murmur heard. Pulmonary:     Effort: Pulmonary effort is normal. No respiratory distress.     Breath sounds: Normal breath sounds.  Abdominal:      General: There is distension.     Palpations: Abdomen is soft.     Tenderness: There is abdominal tenderness.     Hernia: No hernia is present.     Comments: He has multiple surgical scars  Musculoskeletal:        General: No swelling.     Cervical back: Neck supple.  Skin:    General: Skin is warm and dry.     Capillary Refill: Capillary refill takes less than 2 seconds.  Neurological:     General: No focal deficit present.     Mental Status: He is alert. Mental status is at baseline.    ED Results / Procedures / Treatments   Labs (all labs ordered are listed, but only abnormal results are displayed) Labs Reviewed  URINALYSIS, ROUTINE W REFLEX MICROSCOPIC - Abnormal; Notable for the following components:      Result Value   Specific Gravity, Urine 1.043 (*)    All other components within normal limits  COMPREHENSIVE METABOLIC PANEL - Abnormal; Notable for the following components:   CO2 21 (*)    Glucose, Bld 124 (*)    Creatinine, Ser 1.84 (*)    Total Protein 8.3 (*)    Alkaline Phosphatase 150 (*)    GFR, Estimated 41 (*)  All other components within normal limits  CBC WITH DIFFERENTIAL/PLATELET - Abnormal; Notable for the following components:   WBC 12.8 (*)    RDW 15.8 (*)    Neutro Abs 10.8 (*)    All other components within normal limits  BASIC METABOLIC PANEL - Abnormal; Notable for the following components:   Glucose, Bld 108 (*)    Creatinine, Ser 1.64 (*)    Calcium 8.7 (*)    GFR, Estimated 47 (*)    All other components within normal limits  HEPATIC FUNCTION PANEL - Abnormal; Notable for the following components:   Alkaline Phosphatase 128 (*)    All other components within normal limits  AMMONIA - Abnormal; Notable for the following components:   Ammonia 37 (*)    All other components within normal limits  RESP PANEL BY RT-PCR (FLU A&B, COVID) ARPGX2  EXPECTORATED SPUTUM ASSESSMENT W GRAM STAIN, RFLX TO RESP C  LIPASE, BLOOD  LACTIC ACID, PLASMA   VALPROIC ACID LEVEL  PROTIME-INR  STREP PNEUMONIAE URINARY ANTIGEN  MAGNESIUM  LITHIUM LEVEL  TSH  VITAMIN B12  HEMOGLOBIN AND HEMATOCRIT, BLOOD  HEMOGLOBIN AND HEMATOCRIT, BLOOD  HIV ANTIBODY (ROUTINE TESTING W REFLEX)  LEGIONELLA PNEUMOPHILA SEROGP 1 UR AG  RPR  HEMOGLOBIN AND HEMATOCRIT, BLOOD  POC OCCULT BLOOD, ED  TYPE AND SCREEN  ABO/RH  TROPONIN I (HIGH SENSITIVITY)    EKG EKG Interpretation  Date/Time:  Wednesday June 28 2021 21:09:07 EST Ventricular Rate:  83 PR Interval:  168 QRS Duration: 77 QT Interval:  384 QTC Calculation: 452 R Axis:   69 Text Interpretation: Sinus rhythm Probable left atrial enlargement Probable left ventricular hypertrophy No old tracing to compare Confirmed by Aletta Edouard [phone]) on 06/28/2021 9:17:36 PM  Radiology CT Head Wo Contrast  Result Date: 06/28/2021 CLINICAL DATA:  Mental status change, unknown cause EXAM: CT HEAD WITHOUT CONTRAST TECHNIQUE: Contiguous axial images were obtained from the base of the skull through the vertex without intravenous contrast. RADIATION DOSE REDUCTION: This exam was performed according to the departmental dose-optimization program which includes automated exposure control, adjustment of the mA and/or kV according to patient size and/or use of iterative reconstruction technique. COMPARISON:  None. FINDINGS: Brain: No acute intracranial abnormality. Specifically, no hemorrhage, hydrocephalus, mass lesion, acute infarction, or significant intracranial injury. Vascular: No hyperdense vessel or unexpected calcification. Skull: No acute calvarial abnormality. Sinuses/Orbits: No acute findings Other: None IMPRESSION: No acute intracranial abnormality. Electronically Signed   By: Rolm Baptise M.D.   On: 06/28/2021 21:37   CT Abdomen Pelvis W Contrast  Result Date: 06/28/2021 CLINICAL DATA:  Acute abdominal pain. Lethargy. Episode of vomiting. EXAM: CT ABDOMEN AND PELVIS WITH CONTRAST TECHNIQUE: Multidetector  CT imaging of the abdomen and pelvis was performed using the standard protocol following bolus administration of intravenous contrast. RADIATION DOSE REDUCTION: This exam was performed according to the departmental dose-optimization program which includes automated exposure control, adjustment of the mA and/or kV according to patient size and/or use of iterative reconstruction technique. CONTRAST:  47mL OMNIPAQUE IOHEXOL 350 MG/ML SOLN COMPARISON:  None. FINDINGS: Lower chest: Dilated fluid-filled distal esophagus. Nodular airspace disease in the right lower lobe measures 19 mm, series 4, image 21. Hepatobiliary: No focal liver abnormality is seen. No gallstones, gallbladder wall thickening, or biliary dilatation. Pancreas: No pancreatic inflammation or evidence of mass. Slight prominence of the proximal pancreatic duct at 3 mm. Spleen: Normal in size without focal abnormality. Adrenals/Urinary Tract: Normal adrenal glands. Mild right renal scarring, query postsurgical  change in the mid right kidney. No hydronephrosis. 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 No hydronephrosis. No visualized renal calculi. Mild anterior bladder wall thickening. Stomach/Bowel: Detailed bowel assessment is limited in the absence of enteric contrast and paucity of intra-abdominal fat. Dilated fluid-filled distal esophagus. The stomach is distended with fluid/ingested contents. Fluid-filled duodenum and small bowel. Proximal small bowel is mildly dilated. The distal small bowel is less dilated but also fluid-filled. Enteric sutures in the sigmoid colon with suspected left hemicolectomy. There also enteric sutures about cecum. The ascending and transverse colon are fluid-filled, transverse colon is also gaseous distended. Mild diffuse bowel enhancement. Distal sigmoid colon and rectum are nondistended, equivocal wall thickening. There is no other significant small or large  bowel wall thickening or inflammatory change. Appendix not visualized, presumably surgically absent. Scattered mesenteric surgical clips. Vascular/Lymphatic: Normal caliber abdominal aorta. The portal vein is patent. There is no portal venous or mesenteric gas. 7 mm left external iliac node, not enlarged by size criteria. There are no enlarged lymph nodes in the abdomen or pelvis. Reproductive: Prostate is unremarkable. Other: No free air or ascites.  No abdominal wall hernia. Musculoskeletal: There are no acute or suspicious osseous abnormalities. Mild L5-S1 degenerative disc disease. Lower lumbar facet hypertrophy. IMPRESSION: 1. Diffusely fluid-filled bowel,. There is occasional fluid distension of the GI track including the distal esophagus and stomach, proximal small bowel, ascending and transverse colon. Additional areas of bowel are fluid-filled but less dilated. No discrete transition point to suggest obstruction. Overall findings are suspicious for generalized enterocolitis. 2. Nodular airspace disease in the right lower lobe measuring 19 mm. This is likely pneumonia, although CT follow-up is recommended after course of treatment to ensure resolution. This is not amenable to radiographic follow-up, as it was not seen on chest radiograph earlier today. 3. Mild anterior bladder wall thickening, recommend correlation with urinalysis to exclude urinary tract infection. 4. 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 Mild right renal scarring, query postsurgical change. Electronically Signed   By: Keith Rake M.D.   On: 06/28/2021 21:45   DG Chest Port 1 View  Result Date: 06/28/2021 CLINICAL DATA:  Altered mental status EXAM: PORTABLE CHEST 1 VIEW COMPARISON:  04/20/2021 FINDINGS: Interval moderate to marked cardiomegaly. No pleural effusion, consolidation, or pneumothorax. Hazy gas collection in the left upper quadrant probably reflects dilated stomach. Probable air distension of esophagus.  IMPRESSION: 1. Interval cardiomegaly which may be due to chamber enlargement or pericardial effusion 2. Hazy gas structure in the left upper quadrant suspected to represent air distended stomach, there is possible air distension of esophagus. Recommend dedicated abdominal radiographs Electronically Signed   By: Donavan Foil M.D.   On: 06/28/2021 20:50    Procedures .Critical Care Performed by: Hayden Rasmussen, MD Authorized by: Hayden Rasmussen, MD   Critical care provider statement:    Critical care time (minutes):  45   Critical care time was exclusive of:  Separately billable procedures and treating other patients   Critical care was necessary to treat or prevent imminent or life-threatening deterioration of the following conditions:  Circulatory failure and CNS failure or compromise   Critical care was time spent personally by me on the following activities:  Development of treatment plan with patient or surrogate, discussions with consultants, evaluation of patient's response to treatment, examination of patient, obtaining history from patient or surrogate, ordering and performing treatments and interventions, ordering and review of laboratory studies,  ordering and review of radiographic studies, pulse oximetry, re-evaluation of patient's condition and review of old charts   I assumed direction of critical care for this patient from another provider in my specialty: no      Medications Ordered in ED Medications  pantoprozole (PROTONIX) 80 mg /NS 100 mL infusion (8 mg/hr Intravenous New Bag/Given 06/28/21 2227)  cefTRIAXone (ROCEPHIN) 2 g in sodium chloride 0.9 % 100 mL IVPB (0 g Intravenous Stopped 06/29/21 0057)  azithromycin (ZITHROMAX) 500 mg in sodium chloride 0.9 % 250 mL IVPB (500 mg Intravenous New Bag/Given 06/29/21 0024)  sodium chloride flush (NS) 0.9 % injection 3 mL (3 mLs Intravenous Given 06/29/21 1003)  acetaminophen (TYLENOL) tablet 650 mg (has no administration in time  range)    Or  acetaminophen (TYLENOL) suppository 650 mg (has no administration in time range)  0.9 %  sodium chloride infusion ( Intravenous New Bag/Given 06/29/21 0024)  ondansetron (ZOFRAN) tablet 4 mg (has no administration in time range)    Or  ondansetron (ZOFRAN) injection 4 mg (has no administration in time range)  ondansetron (ZOFRAN) injection 4 mg (4 mg Intravenous Given 06/28/21 2026)  sodium chloride 0.9 % bolus 500 mL (0 mLs Intravenous Stopped 06/29/21 0018)  pantoprazole (PROTONIX) injection 40 mg (40 mg Intravenous Given 06/28/21 2027)  pantoprazole (PROTONIX) injection 40 mg (40 mg Intravenous Given 06/28/21 2227)  iohexol (OMNIPAQUE) 350 MG/ML injection 70 mL (70 mLs Intravenous Contrast Given 06/28/21 2124)    ED Course/ Medical Decision Making/ A&P Clinical Course as of 06/29/21 1010  Wed Jun 28, 2021  2001 IV team looking for second line.  Ordered Protonix drip.  Ordered NG tube. [MB]  2048 Chest x-ray does not show any acute infiltrate.  There is a large gastric bubble and air-filled colon. [MB]  2055 To bedside by the nurse after the patient vomited a large volume of maroon vomitus.  Patient states he feels much better after vomiting. 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  Also has a history of partial nephrectomy. [MB]  2202 Discussed with Dr. Watt Climes GI for [facility].  He agrees with PPI drip and keep n.p.o. team will see him tomorrow. [MB]    Clinical Course User Index [MB] Hayden Rasmussen, MD                           Medical Decision Making Amount and/or Complexity of Data Reviewed Labs: ordered. Radiology: ordered.  Risk Prescription drug management. Decision regarding hospitalization.  Nicholas Oconnell was evaluated in Emergency Department on 06/28/2021 for the symptoms described in the history of present illness. He was evaluated in the context of the global COVID-19  pandemic, which necessitated consideration that the patient might be at risk for infection with the SARS-CoV-2 virus that causes COVID-19. Institutional protocols and algorithms that pertain to the evaluation of patients at risk for COVID-19 are in a state of rapid change based on information released by regulatory bodies including the CDC and federal and state organizations. These policies and algorithms were followed during the patient's care in the ED. This patient complains of increased lethargy behavior changes, vomiting blood; this involves an extensive number of treatment Options and is a complaint that carries with it a high risk of complications and Morbidity. The differential includes sepsis, stroke, bleed, infection, GI bleed, obstruction, perforation, metabolic derangement  I ordered, reviewed  and interpreted labs, which included CBC with mildly elevated white count stable hemoglobin, chemistries low bicarb elevated creatinine reflecting some dehydration, COVID and flu negative, troponins flat, valproic acid therapeutic, lactate not elevated I ordered medication IV fluids IV PPI nausea medications I ordered imaging studies which included chest x-ray and CT abdomen and pelvis and I independently    visualized and interpreted imaging which showed dilated and fluid-filled esophagus stomach small bowel large bowel without transition point Additional history obtained from patient's caregiver from facility Previous records obtained and reviewed in epic, 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 I consulted GI Dr. Watt Climes and [facility] hospitalist Dr. Myna Hidalgo and discussed lab and imaging findings  Critical Interventions: Work-up and management of patient's acute GI bleed with fluids and PPI.  After the interventions stated above, I reevaluated the patient and found patient to be hemodynamically stable and symptomatically improved from mental status standpoint.   He will need admitted to the hospital for further management.  Caregiver updated.          Final Clinical Impression(s) / ED Diagnoses Final diagnoses:  Hematemesis with nausea  Pneumonia due to infectious organism, unspecified laterality, unspecified part of lung  Altered mental status, unspecified altered mental status type    Rx / DC Orders ED Discharge Orders     None         Hayden Rasmussen, MD 06/29/21 1017

## 2021-06-28 NOTE — ED Notes (Signed)
Pt vomited a large amount, brown in color, pt states he feels better.

## 2021-06-28 NOTE — ED Notes (Signed)
Nicholas Oconnell QP from group home. [phone].

## 2021-06-29 ENCOUNTER — Inpatient Hospital Stay (HOSPITAL_COMMUNITY): Payer: Medicaid Other

## 2021-06-29 DIAGNOSIS — I517 Cardiomegaly: Secondary | ICD-10-CM | POA: Diagnosis not present

## 2021-06-29 LAB — URINALYSIS, ROUTINE W REFLEX MICROSCOPIC
Bilirubin Urine: NEGATIVE
Glucose, UA: NEGATIVE mg/dL
Hgb urine dipstick: NEGATIVE
Ketones, ur: NEGATIVE mg/dL
Leukocytes,Ua: NEGATIVE
Nitrite: NEGATIVE
Protein, ur: NEGATIVE mg/dL
Specific Gravity, Urine: 1.043 — ABNORMAL HIGH (ref 1.005–1.030)
pH: 5 (ref 5.0–8.0)

## 2021-06-29 LAB — BASIC METABOLIC PANEL
Anion gap: 11 (ref 5–15)
BUN: 18 mg/dL (ref 8–23)
CO2: 24 mmol/L (ref 22–32)
Calcium: 8.7 mg/dL — ABNORMAL LOW (ref 8.9–10.3)
Chloride: 106 mmol/L (ref 98–111)
Creatinine, Ser: 1.64 mg/dL — ABNORMAL HIGH (ref 0.61–1.24)
GFR, Estimated: 47 mL/min — ABNORMAL LOW (ref >60)
Glucose, Bld: 108 mg/dL — ABNORMAL HIGH (ref 70–99)
Potassium: 4.8 mmol/L (ref 3.5–5.1)
Sodium: 141 mmol/L (ref 135–145)

## 2021-06-29 LAB — RPR: RPR Ser Ql: NONREACTIVE

## 2021-06-29 LAB — ABO/RH: ABO/RH(D): AB POS

## 2021-06-29 LAB — VITAMIN B12: Vitamin B-12: 498 pg/mL (ref 180–914)

## 2021-06-29 LAB — MAGNESIUM: Magnesium: 2.3 mg/dL (ref 1.7–2.4)

## 2021-06-29 LAB — ECHOCARDIOGRAM COMPLETE
AR max vel: 2.79 cm2
AV Area VTI: 2.84 cm2
AV Area mean vel: 2.62 cm2
AV Mean grad: 6 mmHg
AV Peak grad: 10.5 mmHg
Ao pk vel: 1.62 m/s
Area-P 1/2: 4.1 cm2
S' Lateral: 2.1 cm

## 2021-06-29 LAB — HEPATIC FUNCTION PANEL
ALT: 16 U/L (ref 0–44)
AST: 25 U/L (ref 15–41)
Albumin: 3.8 g/dL (ref 3.5–5.0)
Alkaline Phosphatase: 128 U/L — ABNORMAL HIGH (ref 38–126)
Bilirubin, Direct: 0.1 mg/dL (ref 0.0–0.2)
Indirect Bilirubin: 0.6 mg/dL (ref 0.3–0.9)
Total Bilirubin: 0.7 mg/dL (ref 0.3–1.2)
Total Protein: 7.8 g/dL (ref 6.5–8.1)

## 2021-06-29 LAB — LITHIUM LEVEL: Lithium Lvl: 0.88 mmol/L (ref 0.60–1.20)

## 2021-06-29 LAB — STREP PNEUMONIAE URINARY ANTIGEN: Strep Pneumo Urinary Antigen: NEGATIVE

## 2021-06-29 LAB — HEMOGLOBIN AND HEMATOCRIT, BLOOD
HCT: 39.2 % (ref 39.0–52.0)
HCT: 41.2 % (ref 39.0–52.0)
HCT: 41.9 % (ref 39.0–52.0)
Hemoglobin: 12.4 g/dL — ABNORMAL LOW (ref 13.0–17.0)
Hemoglobin: 13.2 g/dL (ref 13.0–17.0)
Hemoglobin: 13.7 g/dL (ref 13.0–17.0)

## 2021-06-29 LAB — TSH: TSH: 1.176 u[IU]/mL (ref 0.350–4.500)

## 2021-06-29 LAB — HIV ANTIBODY (ROUTINE TESTING W REFLEX): HIV Screen 4th Generation wRfx: NONREACTIVE

## 2021-06-29 LAB — AMMONIA: Ammonia: 37 umol/L — ABNORMAL HIGH (ref 9–35)

## 2021-06-29 MED ORDER — POLYETHYLENE GLYCOL 3350 17 G PO PACK
17.0000 g | PACK | Freq: Two times a day (BID) | ORAL | Status: DC
  Administered 2021-06-29 – 2021-07-01 (×3): 17 g via ORAL
  Filled 2021-06-29 (×3): qty 1

## 2021-06-29 MED ORDER — PROPRANOLOL HCL 10 MG PO TABS
10.0000 mg | ORAL_TABLET | Freq: Three times a day (TID) | ORAL | Status: DC
  Administered 2021-06-29 – 2021-07-08 (×26): 10 mg via ORAL
  Filled 2021-06-29 (×27): qty 1

## 2021-06-29 MED ORDER — QUETIAPINE FUMARATE 100 MG PO TABS
400.0000 mg | ORAL_TABLET | Freq: Two times a day (BID) | ORAL | Status: DC
  Administered 2021-06-29 – 2021-07-08 (×17): 400 mg via ORAL
  Filled 2021-06-29 (×17): qty 4

## 2021-06-29 MED ORDER — ZOLPIDEM TARTRATE 10 MG PO TABS
10.0000 mg | ORAL_TABLET | Freq: Every day | ORAL | Status: DC
  Administered 2021-06-29 – 2021-07-07 (×7): 10 mg via ORAL
  Filled 2021-06-29 (×8): qty 1

## 2021-06-29 MED ORDER — LACTULOSE 10 GM/15ML PO SOLN
20.0000 g | Freq: Three times a day (TID) | ORAL | Status: DC | PRN
  Administered 2021-07-02: 20 g via ORAL
  Filled 2021-06-29: qty 30

## 2021-06-29 MED ORDER — SENNOSIDES-DOCUSATE SODIUM 8.6-50 MG PO TABS
2.0000 | ORAL_TABLET | Freq: Two times a day (BID) | ORAL | Status: DC
  Administered 2021-06-29 – 2021-07-08 (×16): 2 via ORAL
  Filled 2021-06-29 (×16): qty 2

## 2021-06-29 MED ORDER — PANTOPRAZOLE SODIUM 40 MG IV SOLR
40.0000 mg | Freq: Two times a day (BID) | INTRAVENOUS | Status: DC
  Administered 2021-06-29: 40 mg via INTRAVENOUS
  Filled 2021-06-29: qty 40

## 2021-06-29 NOTE — Assessment & Plan Note (Addendum)
Renal function overall stable as below Recent Labs  Lab 07/01/21 0534 07/02/21 0525 07/03/21 0502 07/04/21 0809 07/06/21 0529  BUN 18 18 16 18 15   CREATININE 1.50* 1.46* 1.51* 1.42* 1.33*

## 2021-06-29 NOTE — Assessment & Plan Note (Addendum)
Seen in chest x-ray, echo with EF 70 to 75%,no RWMA, mild LVH

## 2021-06-29 NOTE — Assessment & Plan Note (Addendum)
History of Ogilvie's syndrome and partial colectomy.  Off NGT.  Being managed conservatively. x-ray abdomen 2/1 unchanged dilatation of the colon mostly due to ileus.  GI signed off.  added dysphagia 3 diet 2/3.  Having loose stool per nursing without actual BM.  Add rectal suppository x1 continue on aggressive MiraLAX 3 times daily, stool softener.  Once he has a good bowel movement we can discharge him to group home.

## 2021-06-29 NOTE — Assessment & Plan Note (Addendum)
Imaging showed nodular airspace disease in RLL up to 90 mm suspected pneumonia managing antibiotics x5 days will need CT follow-up in 2 to 3 weeks to ensure resolution

## 2021-06-29 NOTE — Assessment & Plan Note (Addendum)
Mood stable.  Continue Seroquel and nightly Ambien.

## 2021-06-29 NOTE — Consult Note (Signed)
Referring Provider: [facility] Primary Care Physician:  Neale Burly, MD Primary Gastroenterologist:  Althia Forts  Reason for Consultation:  Hematemesis  HPI: Nicholas Oconnell is a 61 y.o. male medical history significant for schizoaffective disorder, CKD stage III, hypertension, Ogilvie syndrome, history of partial colectomy presents for evaluation of hematemesis.  History obtained through chart review as patient is not oriented. Patient presented to ED with lethargy and hematemesis. Reported that he had 2 days of AMS in which patient has been less interactive. Appears he had one episode of vomiting blood 1/25 which prompted him to present to ED. Patient states he has had dark stools. NG tube placement was attempted in ED and patient strongly resisted.  Per chart review, no previous history of endoscopic procedures.  Past Medical History:  Diagnosis Date   Anxiety    Bipolar affective ([place])    Cancer ([place])    Chronic kidney disease (CKD)    Cognitive deficits    Hypertension    Seizure ([place])     History reviewed. No pertinent surgical history.  Prior to Admission medications   Medication Sig Start Date End Date Taking? Authorizing Provider  divalproex (DEPAKOTE ER) 500 MG 24 hr tablet Take 2 tablets (1,000 mg total) by mouth at bedtime. 04/28/21   Isla Pence, MD    Scheduled Meds:  sodium chloride flush  3 mL Intravenous Q12H   Continuous Infusions:  sodium chloride 75 mL/hr at 06/29/21 0024   azithromycin 500 mg (06/29/21 0024)   cefTRIAXone (ROCEPHIN)  IV Stopped (06/29/21 0057)   pantoprazole 8 mg/hr (06/28/21 2227)   PRN Meds:.acetaminophen **OR** acetaminophen, ondansetron **OR** ondansetron (ZOFRAN) IV  Allergies as of 06/28/2021 - Review Complete 06/28/2021  Allergen Reaction Noted   Benzodiazepines  06/26/2013   Clozapine  08/11/2013    History reviewed. No pertinent family history.  Social History   Socioeconomic History   Marital status: Single     Spouse name: Not on file   Number of children: Not on file   Years of education: Not on file   Highest education level: Not on file  Occupational History   Not on file  Tobacco Use   Smoking status: Never   Smokeless tobacco: Never  Vaping Use   Vaping Use: Never used  Substance and Sexual Activity   Alcohol use: Never   Drug use: Never   Sexual activity: Not on file  Other Topics Concern   Not on file  Social History Narrative   Not on file   Social Determinants of Health   Financial Resource Strain: Not on file  Food Insecurity: Not on file  Transportation Needs: Not on file  Physical Activity: Not on file  Stress: Not on file  Social Connections: Not on file  Intimate Partner Violence: Not on file    Review of Systems: Review of Systems  Unable to perform ROS: Psychiatric disorder    Physical Exam:Physical Exam Constitutional:      Appearance: Normal appearance.  HENT:     Head: Normocephalic and atraumatic.     Nose: Nose normal. No congestion.     Mouth/Throat:     Mouth: Mucous membranes are moist.     Pharynx: Oropharynx is clear.  Eyes:     Extraocular Movements: Extraocular movements intact.     Conjunctiva/sclera: Conjunctivae normal.  Cardiovascular:     Rate and Rhythm: Normal rate and regular rhythm.  Pulmonary:     Effort: Pulmonary effort is normal.  Abdominal:  General: Abdomen is flat. Bowel sounds are normal. There is no distension.     Palpations: Abdomen is soft. There is no mass.     Tenderness: There is no abdominal tenderness. There is no guarding or rebound.     Hernia: No hernia is present.  Musculoskeletal:        General: No swelling. Normal range of motion.  Skin:    General: Skin is warm and dry.  Neurological:     Mental Status: He is alert. Mental status is at baseline.  Psychiatric:        Mood and Affect: Mood normal.     Comments: Frequent fidgeting/shaking. Picking at skin.    Vital signs: Vitals:   06/29/21 0538  06/29/21 0538  BP: 138/84 138/84  Pulse: 85 87  Resp: 16 16  Temp: 98.1 F (36.7 C) 98.1 F (36.7 C)  SpO2:  95%   Last BM Date: 06/29/21 (06/29/21)    GI:  Lab Results: Recent Labs    06/28/21 2002 06/29/21 0002 06/29/21 0447  WBC 12.8*  --   --   HGB 14.0 13.2 13.7  HCT 43.4 41.2 41.9  PLT 250  --   --    BMET Recent Labs    06/28/21 2002 06/29/21 0447  NA 139 141  K 4.6 4.8  CL 107 106  CO2 21* 24  GLUCOSE 124* 108*  BUN 18 18  CREATININE 1.84* 1.64*  CALCIUM 9.3 8.7*   LFT Recent Labs    06/29/21 0447  PROT 7.8  ALBUMIN 3.8  AST 25  ALT 16  ALKPHOS 128*  BILITOT 0.7  BILIDIR 0.1  IBILI 0.6   PT/INR Recent Labs    06/28/21 2035  LABPROT 13.0  INR 1.0     Studies/Results: CT Head Wo Contrast  Result Date: 06/28/2021 CLINICAL DATA:  Mental status change, unknown cause EXAM: CT HEAD WITHOUT CONTRAST TECHNIQUE: Contiguous axial images were obtained from the base of the skull through the vertex without intravenous contrast. RADIATION DOSE REDUCTION: This exam was performed according to the departmental dose-optimization program which includes automated exposure control, adjustment of the mA and/or kV according to patient size and/or use of iterative reconstruction technique. COMPARISON:  None. FINDINGS: Brain: No acute intracranial abnormality. Specifically, no hemorrhage, hydrocephalus, mass lesion, acute infarction, or significant intracranial injury. Vascular: No hyperdense vessel or unexpected calcification. Skull: No acute calvarial abnormality. Sinuses/Orbits: No acute findings Other: None IMPRESSION: No acute intracranial abnormality. Electronically Signed   By: Rolm Baptise M.D.   On: 06/28/2021 21:37   CT Abdomen Pelvis W Contrast  Result Date: 06/28/2021 CLINICAL DATA:  Acute abdominal pain. Lethargy. Episode of vomiting. EXAM: CT ABDOMEN AND PELVIS WITH CONTRAST TECHNIQUE: Multidetector CT imaging of the abdomen and pelvis was performed  using the standard protocol following bolus administration of intravenous contrast. RADIATION DOSE REDUCTION: This exam was performed according to the departmental dose-optimization program which includes automated exposure control, adjustment of the mA and/or kV according to patient size and/or use of iterative reconstruction technique. CONTRAST:  49m OMNIPAQUE IOHEXOL 350 MG/ML SOLN COMPARISON:  None. FINDINGS: Lower chest: Dilated fluid-filled distal esophagus. Nodular airspace disease in the right lower lobe measures 19 mm, series 4, image 21. Hepatobiliary: No focal liver abnormality is seen. No gallstones, gallbladder wall thickening, or biliary dilatation. Pancreas: No pancreatic inflammation or evidence of mass. Slight prominence of the proximal pancreatic duct at 3 mm. Spleen: Normal in size without focal abnormality. Adrenals/Urinary Tract: Normal adrenal glands.  Mild right renal scarring, query postsurgical change in the mid right kidney. No hydronephrosis. 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 No hydronephrosis. No visualized renal calculi. Mild anterior bladder wall thickening. Stomach/Bowel: Detailed bowel assessment is limited in the absence of enteric contrast and paucity of intra-abdominal fat. Dilated fluid-filled distal esophagus. The stomach is distended with fluid/ingested contents. Fluid-filled duodenum and small bowel. Proximal small bowel is mildly dilated. The distal small bowel is less dilated but also fluid-filled. Enteric sutures in the sigmoid colon with suspected left hemicolectomy. There also enteric sutures about cecum. The ascending and transverse colon are fluid-filled, transverse colon is also gaseous distended. Mild diffuse bowel enhancement. Distal sigmoid colon and rectum are nondistended, equivocal wall thickening. There is no other significant small or large bowel wall thickening or inflammatory change. Appendix  not visualized, presumably surgically absent. Scattered mesenteric surgical clips. Vascular/Lymphatic: Normal caliber abdominal aorta. The portal vein is patent. There is no portal venous or mesenteric gas. 7 mm left external iliac node, not enlarged by size criteria. There are no enlarged lymph nodes in the abdomen or pelvis. Reproductive: Prostate is unremarkable. Other: No free air or ascites.  No abdominal wall hernia. Musculoskeletal: There are no acute or suspicious osseous abnormalities. Mild L5-S1 degenerative disc disease. Lower lumbar facet hypertrophy. IMPRESSION: 1. Diffusely fluid-filled bowel,. There is occasional fluid distension of the GI track including the distal esophagus and stomach, proximal small bowel, ascending and transverse colon. Additional areas of bowel are fluid-filled but less dilated. No discrete transition point to suggest obstruction. Overall findings are suspicious for generalized enterocolitis. 2. Nodular airspace disease in the right lower lobe measuring 19 mm. This is likely pneumonia, although CT follow-up is recommended after course of treatment to ensure resolution. This is not amenable to radiographic follow-up, as it was not seen on chest radiograph earlier today. 3. Mild anterior bladder wall thickening, recommend correlation with urinalysis to exclude urinary tract infection. 4. 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 Mild right renal scarring, query postsurgical change. Electronically Signed   By: Keith Rake M.D.   On: 06/28/2021 21:45   DG Chest Port 1 View  Result Date: 06/28/2021 CLINICAL DATA:  Altered mental status EXAM: PORTABLE CHEST 1 VIEW COMPARISON:  04/20/2021 FINDINGS: Interval moderate to marked cardiomegaly. No pleural effusion, consolidation, or pneumothorax. Hazy gas collection in the left upper quadrant probably reflects dilated stomach. Probable air distension of esophagus. IMPRESSION: 1. Interval cardiomegaly which may be due to  chamber enlargement or pericardial effusion 2. Hazy gas structure in the left upper quadrant suspected to represent air distended stomach, there is possible air distension of esophagus. Recommend dedicated abdominal radiographs Electronically Signed   By: Donavan Foil M.D.   On: 06/28/2021 20:50    Impression: GI bleed - BUN 18, Cr. 1.64 - AST 25/ALT 16/alk phos 128 (improving) - Hgb 13.7 -CT abdomen pelvis with contrast 1/25: Diffusely fluid-filled bowel.  Distention including distal esophagus, stomach, proximal small bowel, ascending and transverse colon.  No discrete transition zone.  Possibly generalized enterocolitis.  Pseudo-obstruction of bowel - diffusely fluid-filled bowel with areas of distension on CT without transition point  - multiple admissions for pseudo-obstruction. (Most recently 11/2020). - potassium 4.8  Plan: Will treat conservatively. Hematemesis possibly from esophagitis secondary to fluid and obstruction. Protonix 3m IV BID Continue to maintain magnesium above 2 and potassium at 4-4.5.  Dulcolax suppository prn [name] will follow    LOS: 1  day   Garnette Scheuermann  PA-C 06/29/2021, 8:29 AM  Contact #  [phone]

## 2021-06-29 NOTE — Hospital Course (Addendum)
61 year old male with medical history significant for schizoaffective disorder, CKD stage III, hypertension, Ogilvie syndrome, history of partial colectomy, now presenting to the emergency department with lethargy and hematemesis.  CT scan was concerning for enterocolitis.  GI was consulted.  He's been started on PPI and continued on his home bowel regimen.  S/p NG tube.  Currently working on gradually advancing with support of GI. X-ray shows persistent bowel dilatation but patient continued to have bowel movement, diet is being advanced slowly.  At this time patient diet is being advanced as per GI tolerating well.  Has been ambulating.  PT OT has recommended home health PT OT.  Diet advanced to soft diet 2/3, nursing reports he is having small amount of loose stool

## 2021-06-29 NOTE — Assessment & Plan Note (Addendum)
Was lethargic on admission, but now resolved. W/ cognitive impairment and psychiatric illness at baseline, work-up unyielding with slightly elevated ammonia, normal level-TSH B12, RPR, lithium level, valproic acid level.  Continue home Seroquel Depakote lithium.

## 2021-06-29 NOTE — Progress Notes (Signed)
PROGRESS NOTE    Nicholas Oconnell  NKN:[id number] DOB: [birth year] DOA: 06/28/2021 PCP: Neale Burly, MD  Chief Complaint  Patient presents with   Altered Mental Status   Fatigue   Hemoptysis    Brief Narrative:  Nicholas Oconnell is Nicholas Oconnell pleasant 61 y.o. male with medical history significant for schizoaffective disorder, CKD stage III, hypertension, Ogilvie syndrome, history of partial colectomy, now presenting to the emergency department with lethargy and hematemesis.  The patient is reported to be alert and interactive at his baseline but for the past 2 days has been lethargic and not speaking.  He was also noted to have an episode of vomiting with blood today, prompting presentation to the ED.   ED Course: Upon arrival to the ED, patient is found to be afebrile, saturating well on room air, and with stable blood pressure.  EKG features sinus rhythm with LVH.  Chest x-ray notable for interval enlargement of the cardiac silhouette as well as hazy Structure in the left upper quadrant and possible air distention of the esophagus.  CT head is negative for acute intracranial abnormality.  CT of the abdomen and pelvis demonstrates diffusely fluid-filled bowel with occasional fluid distention but no transition point.  Also noted on CT is nodular airspace disease in the right lower lobe concerning for pneumonia.  Chemistry panel features Nicholas Oconnell creatinine 1.84 and CBC notable for leukocytosis 12,800.  Gastroenterology was consulted by the ED physician and the patient was given IV fluids and IV PPI in the ED.  Patient strongly resisted attempt to place NG tube in the ED.    Assessment & Plan:   Principal Problem:   GI bleeding Active Problems:   Acute pseudo-obstruction of bowel   Pneumonia   Acute encephalopathy   Cardiomegaly   CKD (chronic kidney disease), stage III (HCC)   Schizoaffective disorder, bipolar type (HCC)   * GI bleeding- (present on admission) CT abd/pelvis fluid filled bowel,  findings concerning for enterocolitis Episode of hematemesis Appreciate GI c/s, recommending PPI BID and clear liquid diet Maintain potassium >4 and magnesium >2  Acute pseudo-obstruction of bowel- (present on admission) Continue lactulose 20 g TID prn, miralax BID, senna BID Continue K>4 and mag>2 Clear liquid diet  Pneumonia- (present on admission) Nodular airspace dz in RLL measuring 19 mm Likely pneumonia Recommending CT follow up  Continue ceftriaxone/azithromycin  Acute encephalopathy- (present on admission) Lethargy is improved Cognitive impairment and psychiatric illness at baseline Ammonia mildly elevated, will repeat TSH, b12, RPR wnl Lithium level WNL Valproic acid level wnl contineu home seroquel, doesn't appear he's taking depakote anymore or lithium (though levels positive? Follow with pharmacy?)   Cardiomegaly- (present on admission) Noted on CXR Echo with EF 70-75%, no RWMA, mild LVH   CKD (chronic kidney disease), stage III ([place])- (present on admission) Creatinine appears close to baseline  Schizoaffective disorder, bipolar type ([place])- (present on admission) seroquel Nightly ambien   DVT prophylaxis: SCD Code Status: full Family Communication: none Disposition:   Status is: Inpatient  Remains inpatient appropriate because: need for IV abx, advancing diet       Consultants:  GI  Procedures:  none  Antimicrobials:  Anti-infectives (From admission, onward)    Start     Dose/Rate Route Frequency Ordered Stop   06/28/21 2245  cefTRIAXone (ROCEPHIN) 2 g in sodium chloride 0.9 % 100 mL IVPB        2 g 200 mL/hr over 30 Minutes Intravenous Every 24 hours 06/28/21 2238 07/03/21 2244  06/28/21 2245  azithromycin (ZITHROMAX) 500 mg in sodium chloride 0.9 % 250 mL IVPB        500 mg 250 mL/hr over 60 Minutes Intravenous Every 24 hours 06/28/21 2238 07/03/21 2244       Subjective: No complaints  Objective: Vitals:   06/29/21 0928  06/29/21 1347 06/29/21 1600 06/29/21 1921  BP: (!) 137/117 (!) 141/91  (!) 142/115  Pulse: 85 83  81  Resp: 14 14  16   Temp: 100.3 F (37.9 C) 98.9 F (37.2 C)  98.6 F (37 C)  TempSrc: Oral Oral  Oral  SpO2: 98% (!) 85% 98% 96%    Intake/Output Summary (Last 24 hours) at 06/29/2021 1093 Last data filed at 06/29/2021 1700 Gross per 24 hour  Intake 2136.51 ml  Output --  Net 2136.51 ml   There were no vitals filed for this visit.  Examination:  General exam: Appears calm and comfortable  Respiratory system: unlabored Cardiovascular system: RRR Gastrointestinal system: Abdomen is nondistended, soft and nontender.  Central nervous system: pleasant, conversant Extremities: no LEE Skin: No rashes, lesions or ulcers Psychiatry: Judgement and insight appear impaired.     Data Reviewed: I have personally reviewed following labs and imaging studies  CBC: Recent Labs  Lab 06/28/21 2002 06/29/21 0002 06/29/21 0447 06/29/21 1712  WBC 12.8*  --   --   --   NEUTROABS 10.8*  --   --   --   HGB 14.0 13.2 13.7 12.4*  HCT 43.4 41.2 41.9 39.2  MCV 91.9  --   --   --   PLT 250  --   --   --     Basic Metabolic Panel: Recent Labs  Lab 06/28/21 2002 06/29/21 0447  NA 139 141  K 4.6 4.8  CL 107 106  CO2 21* 24  GLUCOSE 124* 108*  BUN 18 18  CREATININE 1.84* 1.64*  CALCIUM 9.3 8.7*  MG  --  2.3    GFR: CrCl cannot be calculated (Unknown ideal weight.).  Liver Function Tests: Recent Labs  Lab 06/28/21 2002 06/29/21 0447  AST 23 25  ALT 17 16  ALKPHOS 150* 128*  BILITOT 0.7 0.7  PROT 8.3* 7.8  ALBUMIN 4.3 3.8    CBG: No results for input(s): GLUCAP in the last 168 hours.   Recent Results (from the past 240 hour(s))  Resp Panel by RT-PCR (Flu Nicholas Oconnell, Covid) Nasopharyngeal Swab     Status: None   Collection Time: 06/28/21  8:03 PM   Specimen: Nasopharyngeal Swab; Nasopharyngeal(NP) swabs in vial transport medium  Result Value Ref Range Status   SARS  Coronavirus 2 by RT PCR NEGATIVE NEGATIVE Final    Comment: (NOTE) SARS-CoV-2 target nucleic acids are NOT DETECTED.  The SARS-CoV-2 RNA is generally detectable in upper respiratory specimens during the acute phase of infection. The lowest concentration of SARS-CoV-2 viral copies this assay can detect is 138 copies/mL. Nicholas Oconnell negative result does not preclude SARS-Cov-2 infection and should not be used as the sole basis for treatment or other patient management decisions. Nicholas Oconnell negative result may occur with  improper specimen collection/handling, submission of specimen other than nasopharyngeal swab, presence of viral mutation(s) within the areas targeted by this assay, and inadequate number of viral copies(<138 copies/mL). Nicholas Oconnell negative result must be combined with clinical observations, patient history, and epidemiological information. The expected result is Negative.  Fact Sheet for Patients:  EntrepreneurPulse.com.au  Fact Sheet for Healthcare Providers:  IncredibleEmployment.be  This test is  no t yet approved or cleared by the [place] and  has been authorized for detection and/or diagnosis of SARS-CoV-2 by FDA under an Emergency Use Authorization (EUA). This EUA will remain  in effect (meaning this test can be used) for the duration of the COVID-19 declaration under Section 564(b)(1) of the Act, 21 U.S.C.section 360bbb-3(b)(1), unless the authorization is terminated  or revoked sooner.       Influenza Boby Eyer by PCR NEGATIVE NEGATIVE Final   Influenza B by PCR NEGATIVE NEGATIVE Final    Comment: (NOTE) The Xpert Xpress SARS-CoV-2/FLU/RSV plus assay is intended as an aid in the diagnosis of influenza from Nasopharyngeal swab specimens and should not be used as Nicholas Oconnell sole basis for treatment. Nasal washings and aspirates are unacceptable for Xpert Xpress SARS-CoV-2/FLU/RSV testing.  Fact Sheet for  Patients: EntrepreneurPulse.com.au  Fact Sheet for Healthcare Providers: IncredibleEmployment.be  This test is not yet approved or cleared by the [place] FDA and has been authorized for detection and/or diagnosis of SARS-CoV-2 by FDA under an Emergency Use Authorization (EUA). This EUA will remain in effect (meaning this test can be used) for the duration of the COVID-19 declaration under Section 564(b)(1) of the Act, 21 U.S.C. section 360bbb-3(b)(1), unless the authorization is terminated or revoked.  Performed at [facility], [place] [address], [place], [place] [address]          Radiology Studies: CT Head Wo Contrast  Result Date: 06/28/2021 CLINICAL DATA:  Mental status change, unknown cause EXAM: CT HEAD WITHOUT CONTRAST TECHNIQUE: Contiguous axial images were obtained from the base of the skull through the vertex without intravenous contrast. RADIATION DOSE REDUCTION: This exam was performed according to the departmental dose-optimization program which includes automated exposure control, adjustment of the mA and/or kV according to patient size and/or use of iterative reconstruction technique. COMPARISON:  None. FINDINGS: Brain: No acute intracranial abnormality. Specifically, no hemorrhage, hydrocephalus, mass lesion, acute infarction, or significant intracranial injury. Vascular: No hyperdense vessel or unexpected calcification. Skull: No acute calvarial abnormality. Sinuses/Orbits: No acute findings Other: None IMPRESSION: No acute intracranial abnormality. Electronically Signed   By: Rolm Baptise M.D.   On: 06/28/2021 21:37   CT Abdomen Pelvis W Contrast  Result Date: 06/28/2021 CLINICAL DATA:  Acute abdominal pain. Lethargy. Episode of vomiting. EXAM: CT ABDOMEN AND PELVIS WITH CONTRAST TECHNIQUE: Multidetector CT imaging of the abdomen and pelvis was performed using the standard protocol following bolus administration  of intravenous contrast. RADIATION DOSE REDUCTION: This exam was performed according to the departmental dose-optimization program which includes automated exposure control, adjustment of the mA and/or kV according to patient size and/or use of iterative reconstruction technique. CONTRAST:  11mL OMNIPAQUE IOHEXOL 350 MG/ML SOLN COMPARISON:  None. FINDINGS: Lower chest: Dilated fluid-filled distal esophagus. Nodular airspace disease in the right lower lobe measures 19 mm, series 4, image 21. Hepatobiliary: No focal liver abnormality is seen. No gallstones, gallbladder wall thickening, or biliary dilatation. Pancreas: No pancreatic inflammation or evidence of mass. Slight prominence of the proximal pancreatic duct at 3 mm. Spleen: Normal in size without focal abnormality. Adrenals/Urinary Tract: Normal adrenal glands. Mild right renal scarring, query postsurgical change in the mid right kidney. No hydronephrosis. 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 No hydronephrosis. No visualized renal calculi. Mild anterior bladder wall thickening. Stomach/Bowel: Detailed bowel assessment is limited in the absence of enteric contrast and paucity of intra-abdominal fat. Dilated fluid-filled distal esophagus. The stomach  is distended with fluid/ingested contents. Fluid-filled duodenum and small bowel. Proximal small bowel is mildly dilated. The distal small bowel is less dilated but also fluid-filled. Enteric sutures in the sigmoid colon with suspected left hemicolectomy. There also enteric sutures about cecum. The ascending and transverse colon are fluid-filled, transverse colon is also gaseous distended. Mild diffuse bowel enhancement. Distal sigmoid colon and rectum are nondistended, equivocal wall thickening. There is no other significant small or large bowel wall thickening or inflammatory change. Appendix not visualized, presumably surgically absent. Scattered  mesenteric surgical clips. Vascular/Lymphatic: Normal caliber abdominal aorta. The portal vein is patent. There is no portal venous or mesenteric gas. 7 mm left external iliac node, not enlarged by size criteria. There are no enlarged lymph nodes in the abdomen or pelvis. Reproductive: Prostate is unremarkable. Other: No free air or ascites.  No abdominal wall hernia. Musculoskeletal: There are no acute or suspicious osseous abnormalities. Mild L5-S1 degenerative disc disease. Lower lumbar facet hypertrophy. IMPRESSION: 1. Diffusely fluid-filled bowel,. There is occasional fluid distension of the GI track including the distal esophagus and stomach, proximal small bowel, ascending and transverse colon. Additional areas of bowel are fluid-filled but less dilated. No discrete transition point to suggest obstruction. Overall findings are suspicious for generalized enterocolitis. 2. Nodular airspace disease in the right lower lobe measuring 19 mm. This is likely pneumonia, although CT follow-up is recommended after course of treatment to ensure resolution. This is not amenable to radiographic follow-up, as it was not seen on chest radiograph earlier today. 3. Mild anterior bladder wall thickening, recommend correlation with urinalysis to exclude urinary tract infection. 4. 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 Mild right renal scarring, query postsurgical change. Electronically Signed   By: Keith Rake M.D.   On: 06/28/2021 21:45   DG Chest Port 1 View  Result Date: 06/28/2021 CLINICAL DATA:  Altered mental status EXAM: PORTABLE CHEST 1 VIEW COMPARISON:  04/20/2021 FINDINGS: Interval moderate to marked cardiomegaly. No pleural effusion, consolidation, or pneumothorax. Hazy gas collection in the left upper quadrant probably reflects dilated stomach. Probable air distension of esophagus. IMPRESSION: 1. Interval cardiomegaly which may be due to chamber enlargement or pericardial effusion 2. Hazy gas  structure in the left upper quadrant suspected to represent air distended stomach, there is possible air distension of esophagus. Recommend dedicated abdominal radiographs Electronically Signed   By: Donavan Foil M.D.   On: 06/28/2021 20:50   ECHOCARDIOGRAM COMPLETE  Result Date: 06/29/2021    ECHOCARDIOGRAM REPORT   Patient Name:   Nicholas Oconnell Date of Exam: 06/29/2021 Medical Rec #:  [id number]    Height:       66.0 in Accession #:    [id number]   Weight:       141.0 lb Date of Birth:  1960/10/17   BSA:          1.724 m Patient Age:    22 years     BP:           137/117 mmHg Patient Gender: M            HR:           85 bpm. Exam Location:  Inpatient Procedure: 2D Echo Indications:    Cardiomegaly  History:        Patient has no prior history of Echocardiogram examinations.                 Risk Factors:Hypertension.  Sonographer:    Arlyss Gandy Referring Phys:  [id number] TIMOTHY S OPYD  Sonographer Comments: PLAX measurements at end of study due to patient condition. IMPRESSIONS  1. Left ventricular ejection fraction, by estimation, is 70 to 75%. The left ventricle has hyperdynamic function. The left ventricle has no regional wall motion abnormalities. There is mild left ventricular hypertrophy of the basal-septal segment. Left ventricular diastolic parameters were normal.  2. Right ventricular systolic function is normal. The right ventricular size is normal.  3. The mitral valve is normal in structure. Trivial mitral valve regurgitation. No evidence of mitral stenosis.  4. The aortic valve is normal in structure. Aortic valve regurgitation is not visualized. No aortic stenosis is present. Aortic valve area, by VTI measures 2.84 cm. Aortic valve mean gradient measures 6.0 mmHg. Aortic valve Vmax measures 1.62 m/s. FINDINGS  Left Ventricle: Left ventricular ejection fraction, by estimation, is 70 to 75%. The left ventricle has hyperdynamic function. The left ventricle has no regional wall motion abnormalities.  The left ventricular internal cavity size was normal in size. There is mild left ventricular hypertrophy of the basal-septal segment. Left ventricular diastolic parameters were normal. Normal left ventricular filling pressure. Right Ventricle: The right ventricular size is normal. No increase in right ventricular wall thickness. Right ventricular systolic function is normal. Left Atrium: Left atrial size was normal in size. Right Atrium: Right atrial size was normal in size. Pericardium: There is no evidence of pericardial effusion. Mitral Valve: The mitral valve is normal in structure. Trivial mitral valve regurgitation. No evidence of mitral valve stenosis. Tricuspid Valve: The tricuspid valve is normal in structure. Tricuspid valve regurgitation is trivial. No evidence of tricuspid stenosis. Aortic Valve: The aortic valve is normal in structure. Aortic valve regurgitation is not visualized. No aortic stenosis is present. Aortic valve mean gradient measures 6.0 mmHg. Aortic valve peak gradient measures 10.5 mmHg. Aortic valve area, by VTI measures 2.84 cm. Pulmonic Valve: The pulmonic valve was normal in structure. Pulmonic valve regurgitation is trivial. No evidence of pulmonic stenosis. Aorta: The aortic root is normal in size and structure. Venous: The inferior vena cava was not well visualized. IAS/Shunts: No atrial level shunt detected by color flow Doppler.  LEFT VENTRICLE PLAX 2D LVIDd:         3.40 cm   Diastology LVIDs:         2.10 cm   LV e' medial:    9.36 cm/s LV PW:         1.10 cm   LV E/e' medial:  7.9 LV IVS:        1.20 cm   LV e' lateral:   8.92 cm/s LVOT diam:     2.00 cm   LV E/e' lateral: 8.3 LV SV:         79 LV SV Index:   46 LVOT Area:     3.14 cm  RIGHT VENTRICLE RV Basal diam:  3.40 cm RV Mid diam:    3.50 cm RV S prime:     17.00 cm/s TAPSE (M-mode): 2.5 cm LEFT ATRIUM             Index        RIGHT ATRIUM           Index LA diam:        3.50 cm 2.03 cm/m   RA Area:     13.60 cm LA  Vol (A2C):   56.7 ml 32.89 ml/m  RA Volume:   32.10 ml  18.62 ml/m LA Vol (A4C):  31.2 ml 18.10 ml/m LA Biplane Vol: 42.9 ml 24.89 ml/m  AORTIC VALVE AV Area (Vmax):    2.79 cm AV Area (Vmean):   2.62 cm AV Area (VTI):     2.84 cm AV Vmax:           162.00 cm/s AV Vmean:          113.000 cm/s AV VTI:            0.280 m AV Peak Grad:      10.5 mmHg AV Mean Grad:      6.0 mmHg LVOT Vmax:         144.00 cm/s LVOT Vmean:        94.300 cm/s LVOT VTI:          0.253 m LVOT/AV VTI ratio: 0.90  AORTA Ao Root diam: 2.80 cm Ao Asc diam:  3.20 cm MITRAL VALVE               TRICUSPID VALVE MV Area (PHT): 4.10 cm    TR Peak grad:   24.6 mmHg MV Decel Time: 185 msec    TR Vmax:        248.00 cm/s MV E velocity: 73.80 cm/s MV Latonja Bobeck velocity: 81.00 cm/s  SHUNTS MV E/Sami Froh ratio:  0.91        Systemic VTI:  0.25 m                            Systemic Diam: 2.00 cm Fransico Him MD Electronically signed by Fransico Him MD Signature Date/Time: 06/29/2021/3:12:16 PM    Final         Scheduled Meds:  pantoprazole (PROTONIX) IV  40 mg Intravenous Q12H   polyethylene glycol  17 g Oral BID   propranolol  10 mg Oral TID   QUEtiapine  400 mg Oral BID   senna-docusate  2 tablet Oral BID   sodium chloride flush  3 mL Intravenous Q12H   zolpidem  10 mg Oral QHS   Continuous Infusions:  azithromycin 500 mg (06/29/21 0024)   cefTRIAXone (ROCEPHIN)  IV Stopped (06/29/21 0057)     LOS: 1 day    Time spent: over 30 min    Fayrene Helper, MD [facility]   To contact the attending provider between 7A-7P or the covering provider during after hours 7P-7A, please log into the web site [email or web address] and access using universal [place] password for that web site. If you do not have the password, please call the hospital operator.  06/29/2021, 7:23 PM

## 2021-06-29 NOTE — Progress Notes (Signed)
Echocardiogram 2D Echocardiogram has been performed.  Arlyss Gandy 06/29/2021, 2:59 PM

## 2021-06-29 NOTE — Assessment & Plan Note (Addendum)
Episode of hematemesis likely from esophagitis/pseudoobstruction.  Seen by GI no evidence of active bleeding.CT abd/pelvis 1/25-fluid filled bowel, dilated.Hb normalized. Recent Labs  Lab 07/01/21 0534 07/02/21 0525 07/03/21 0502 07/04/21 0809 07/06/21 0529  HGB 12.3* 12.0* 11.9* 12.8* 13.5  HCT 38.6* 37.5* 36.3* 39.1 41.1

## 2021-06-30 ENCOUNTER — Inpatient Hospital Stay (HOSPITAL_COMMUNITY): Payer: Medicaid Other

## 2021-06-30 LAB — COMPREHENSIVE METABOLIC PANEL
ALT: 13 U/L (ref 0–44)
AST: 20 U/L (ref 15–41)
Albumin: 3.6 g/dL (ref 3.5–5.0)
Alkaline Phosphatase: 112 U/L (ref 38–126)
Anion gap: 10 (ref 5–15)
BUN: 15 mg/dL (ref 8–23)
CO2: 27 mmol/L (ref 22–32)
Calcium: 8.8 mg/dL — ABNORMAL LOW (ref 8.9–10.3)
Chloride: 104 mmol/L (ref 98–111)
Creatinine, Ser: 1.44 mg/dL — ABNORMAL HIGH (ref 0.61–1.24)
GFR, Estimated: 55 mL/min — ABNORMAL LOW (ref >60)
Glucose, Bld: 111 mg/dL — ABNORMAL HIGH (ref 70–99)
Potassium: 4.2 mmol/L (ref 3.5–5.1)
Sodium: 141 mmol/L (ref 135–145)
Total Bilirubin: 0.6 mg/dL (ref 0.3–1.2)
Total Protein: 7.3 g/dL (ref 6.5–8.1)

## 2021-06-30 LAB — CBC WITH DIFFERENTIAL/PLATELET
Abs Immature Granulocytes: 0.06 10*3/uL (ref 0.00–0.07)
Basophils Absolute: 0 10*3/uL (ref 0.0–0.1)
Basophils Relative: 0 %
Eosinophils Absolute: 0.1 10*3/uL (ref 0.0–0.5)
Eosinophils Relative: 1 %
HCT: 39.9 % (ref 39.0–52.0)
Hemoglobin: 12.6 g/dL — ABNORMAL LOW (ref 13.0–17.0)
Immature Granulocytes: 1 %
Lymphocytes Relative: 11 %
Lymphs Abs: 1.2 10*3/uL (ref 0.7–4.0)
MCH: 29.5 pg (ref 26.0–34.0)
MCHC: 31.6 g/dL (ref 30.0–36.0)
MCV: 93.4 fL (ref 80.0–100.0)
Monocytes Absolute: 1.5 10*3/uL — ABNORMAL HIGH (ref 0.1–1.0)
Monocytes Relative: 14 %
Neutro Abs: 7.7 10*3/uL (ref 1.7–7.7)
Neutrophils Relative %: 73 %
Platelets: 205 10*3/uL (ref 150–400)
RBC: 4.27 MIL/uL (ref 4.22–5.81)
RDW: 15.8 % — ABNORMAL HIGH (ref 11.5–15.5)
WBC: 10.6 10*3/uL — ABNORMAL HIGH (ref 4.0–10.5)
nRBC: 0 % (ref 0.0–0.2)

## 2021-06-30 LAB — LEGIONELLA PNEUMOPHILA SEROGP 1 UR AG: L. pneumophila Serogp 1 Ur Ag: NEGATIVE

## 2021-06-30 LAB — PHOSPHORUS: Phosphorus: 4.3 mg/dL (ref 2.5–4.6)

## 2021-06-30 LAB — MAGNESIUM: Magnesium: 2.5 mg/dL — ABNORMAL HIGH (ref 1.7–2.4)

## 2021-06-30 MED ORDER — DIVALPROEX SODIUM ER 500 MG PO TB24: 1000.0000 mg | ORAL_TABLET | Freq: Every day | ORAL | Status: DC

## 2021-06-30 MED ORDER — LORAZEPAM 2 MG/ML IJ SOLN
1.0000 mg | Freq: Once | INTRAMUSCULAR | Status: AC | PRN
Start: 2021-06-30 — End: 2021-06-30
  Administered 2021-06-30: 1 mg via INTRAVENOUS
  Filled 2021-06-30: qty 1

## 2021-06-30 MED ORDER — LITHIUM CARBONATE 150 MG PO CAPS
150.0000 mg | ORAL_CAPSULE | Freq: Every day | ORAL | Status: DC
  Administered 2021-06-30 – 2021-07-08 (×9): 150 mg via ORAL
  Filled 2021-06-30 (×9): qty 1

## 2021-06-30 MED ORDER — VALPROATE SODIUM 100 MG/ML IV SOLN
500.0000 mg | Freq: Two times a day (BID) | INTRAVENOUS | Status: DC
  Administered 2021-06-30 – 2021-07-03 (×6): 500 mg via INTRAVENOUS
  Filled 2021-06-30 (×6): qty 5

## 2021-06-30 MED ORDER — SIMETHICONE 80 MG PO CHEW: 80.0000 mg | CHEWABLE_TABLET | Freq: Four times a day (QID) | ORAL | Status: DC | PRN

## 2021-06-30 MED ORDER — CHLORHEXIDINE GLUCONATE 0.12 % MT SOLN
15.0000 mL | Freq: Two times a day (BID) | OROMUCOSAL | Status: DC
  Administered 2021-06-30 – 2021-07-08 (×15): 15 mL via OROMUCOSAL
  Filled 2021-06-30 (×14): qty 15

## 2021-06-30 MED ORDER — METOPROLOL TARTRATE 5 MG/5ML IV SOLN
5.0000 mg | Freq: Four times a day (QID) | INTRAVENOUS | Status: DC | PRN
  Filled 2021-06-30: qty 5

## 2021-06-30 MED ORDER — PROPRANOLOL HCL 10 MG PO TABS
10.0000 mg | ORAL_TABLET | Freq: Once | ORAL | Status: AC
  Administered 2021-06-30: 10 mg via ORAL
  Filled 2021-06-30: qty 1

## 2021-06-30 MED ORDER — PANTOPRAZOLE SODIUM 40 MG PO TBEC
40.0000 mg | DELAYED_RELEASE_TABLET | Freq: Two times a day (BID) | ORAL | Status: DC
  Administered 2021-06-30: 40 mg via ORAL
  Filled 2021-06-30: qty 1

## 2021-06-30 MED ORDER — PANTOPRAZOLE SODIUM 40 MG IV SOLR
40.0000 mg | Freq: Two times a day (BID) | INTRAVENOUS | Status: DC
  Administered 2021-06-30 – 2021-07-01 (×3): 40 mg via INTRAVENOUS
  Filled 2021-06-30 (×3): qty 40

## 2021-06-30 MED ORDER — LITHIUM CARBONATE 300 MG PO CAPS
300.0000 mg | ORAL_CAPSULE | Freq: Every day | ORAL | Status: DC
  Administered 2021-07-01 – 2021-07-07 (×7): 300 mg via ORAL
  Filled 2021-06-30 (×8): qty 1

## 2021-06-30 MED ORDER — ORAL CARE MOUTH RINSE
15.0000 mL | Freq: Two times a day (BID) | OROMUCOSAL | Status: DC
  Administered 2021-07-01 – 2021-07-07 (×12): 15 mL via OROMUCOSAL

## 2021-06-30 MED ORDER — HALOPERIDOL 5 MG PO TABS
20.0000 mg | ORAL_TABLET | Freq: Three times a day (TID) | ORAL | Status: DC
  Administered 2021-06-30 – 2021-07-08 (×24): 20 mg via ORAL
  Filled 2021-06-30 (×24): qty 4

## 2021-06-30 MED ORDER — HALOPERIDOL LACTATE 5 MG/ML IJ SOLN: 2.0000 mg | Freq: Four times a day (QID) | INTRAMUSCULAR | Status: DC | PRN

## 2021-06-30 NOTE — Progress Notes (Signed)
Guardian [facility] and updated on patients status. Guardian agreeable upon NG tube as well as wrist restraints for patient safety.

## 2021-06-30 NOTE — TOC Initial Note (Signed)
Transition of Care [facility]) - Initial/Assessment Note    Patient Details  Name: Nicholas Oconnell MRN: [id number] Date of Birth: 1960/11/27  [facility] CM/SW Contact:    Trish Mage, LCSW Phone Number: 06/30/2021, 10:56 AM  Clinical Narrative:   Patient seen in follow up to needs as he is a group home resident. Feguson,Lacey, facility QP, [phone], was in the room.  It was difficult for me to understand what Nicholas Oconnell was saying, so I spoke mainly to Ms Glo Herring.  She stated that he has been a resident with them for over a year, and they plan to receive him back at d/c.  She is the contact for transportation, even on weekends.  No FL2 is needed, but they do need a copy of the d/c summary, and to be informed of any new medications so they can make sure to start those along with his current regimen. TOC will continue to follow during the course of hospitalization.                Expected Discharge Plan: Home/Self Care Barriers to Discharge: No Barriers Identified   Patient Goals and CMS Choice Patient states their goals for this hospitalization and ongoing recovery are:: Response unintelligible      Expected Discharge Plan and Services Expected Discharge Plan: Home/Self Care   Discharge Planning Services: CM Consult Post Acute Care Choice: Resumption of Svcs/PTA Provider Living arrangements for the past 2 months: Group Home                                      Prior Living Arrangements/Services Living arrangements for the past 2 months: Group Home Lives with:: Facility Resident Patient language and need for interpreter reviewed:: Yes        Need for Family Participation in Patient Care:  (unknown) Care giver support system in place?: Yes (comment)   Criminal Activity/Legal Involvement Pertinent to Current Situation/Hospitalization: No - Comment as needed  Activities of Daily Living Home Assistive Devices/Equipment: None ADL Screening (condition at time  of admission) Patient's cognitive ability adequate to safely complete daily activities?: Yes Is the patient deaf or have difficulty hearing?: No Does the patient have difficulty seeing, even when wearing glasses/contacts?: No Does the patient have difficulty concentrating, remembering, or making decisions?: Yes Patient able to express need for assistance with ADLs?: Yes Does the patient have difficulty dressing or bathing?: Yes Independently performs ADLs?: No Communication: Independent Dressing (OT): Needs assistance Is this a change from baseline?: Pre-admission baseline Grooming: Needs assistance Is this a change from baseline?: Pre-admission baseline Feeding: Independent Bathing: Needs assistance Is this a change from baseline?: Pre-admission baseline Toileting: Needs assistance Is this a change from baseline?: Pre-admission baseline In/Out Bed: Independent with device (comment) Walks in Home: Independent with device (comment) Does the patient have difficulty walking or climbing stairs?: No Weakness of Legs: None Weakness of Arms/Hands: None  Permission Sought/Granted Permission sought to share information with : Other (comment) Permission granted to share information with : Yes, Verbal Permission Granted  Share Information with NAME: Nicholas Oconnell, facility QP   [phone]           Emotional Assessment Appearance:: Appears older than stated age Attitude/Demeanor/Rapport: Engaged Affect (typically observed): Appropriate Orientation: : Oriented to Self Alcohol / Substance Use: Not Applicable Psych Involvement: Yes (comment)  Admission diagnosis:  GI bleeding [K92.2] Patient Active Problem List   Diagnosis  Date Noted   GI bleeding 06/28/2021   CKD (chronic kidney disease), stage III (HCC)    Pneumonia    Cardiomegaly    Schizoaffective disorder, bipolar type ([place])    Acute pseudo-obstruction of bowel    Acute encephalopathy    PCP:  Neale Burly,  MD Pharmacy:  No Pharmacies Listed    Social Determinants of Health (SDOH) Interventions    Readmission Risk Interventions No flowsheet data found.

## 2021-06-30 NOTE — Progress Notes (Signed)
[facility] Progress Note  Nicholas Oconnell 61 y.o. 1961-03-11  CC: Hematemesis, abnormal CT   Subjective: Per RN and flowsheet, patient has had no further bowel movements.  Patient denies abdominal pain.  Patient denies nausea and vomiting.  Representative from group home is sitting at bedside.  She states she saw his episode of hematemesis at group home and states it was about 2 cupfuls of dark brown blood. Patient remains at baseline disoriented.  ROS : Review of Systems  Unable to perform ROS: Psychiatric disorder     Objective: Vital signs in last 24 hours: Vitals:   06/30/21 0456 06/30/21 0458  BP: (!) 164/98 (!) 174/98  Pulse:    Resp:    Temp:    SpO2:      Physical Exam:  General:  Alert, cooperative, no distress, appears stated age  Head:  Normocephalic, without obvious abnormality, atraumatic  Eyes:  Anicteric sclera, EOM's intact  Lungs:   Clear to auscultation bilaterally, respirations unlabored  Heart:  Regular rate and rhythm, S1, S2 normal  Abdomen:   Abdomen tense, nontender, absent/hypoactive bowel sounds.    Lab Results: Recent Labs    06/29/21 0447 06/30/21 0512  NA 141 141  K 4.8 4.2  CL 106 104  CO2 24 27  GLUCOSE 108* 111*  BUN 18 15  CREATININE 1.64* 1.44*  CALCIUM 8.7* 8.8*  MG 2.3 2.5*  PHOS  --  4.3   Recent Labs    06/29/21 0447 06/30/21 0512  AST 25 20  ALT 16 13  ALKPHOS 128* 112  BILITOT 0.7 0.6  PROT 7.8 7.3  ALBUMIN 3.8 3.6   Recent Labs    06/28/21 2002 06/29/21 0002 06/29/21 1712 06/30/21 0512  WBC 12.8*  --   --  10.6*  NEUTROABS 10.8*  --   --  7.7  HGB 14.0   < > 12.4* 12.6*  HCT 43.4   < > 39.2 39.9  MCV 91.9  --   --  93.4  PLT 250  --   --  205   < > = values in this interval not displayed.   Recent Labs    06/28/21 2035  LABPROT 13.0  INR 1.0      Assessment Abdominal distention - diffusely fluid-filled bowel with areas of distension on CT 1/26 without transition point  - potassium  4.2 -Magnesium 2.5 -Phosphorus 4.3 - Leukocytosis with WBC 10.6  Hematemesis -Hgb 12.6  Plan: Abdominal physical exam showed tense abdomen with absent bowel sounds. Will get stat xray for further evaluation. Continue to maintain magnesium above 2 and potassium at 4-4.5.  Continue clear liquid diet as tolerated No further episodes of emesis. [name] will follow  Garnette Scheuermann PA-C 06/30/2021, 10:57 AM  Contact #  [phone]

## 2021-06-30 NOTE — Progress Notes (Signed)
PROGRESS NOTE    [name]  PYK:[id number] DOB: 10-16-1960 DOA: 06/28/2021 PCP: Neale Burly, MD  Chief Complaint  Patient presents with   Altered Mental Status   Fatigue   Hemoptysis    Brief Narrative:  Nicholas Oconnell is Nicholas Oconnell pleasant 61 y.o. male with medical history significant for schizoaffective disorder, CKD stage III, hypertension, Ogilvie syndrome, history of partial colectomy, now presenting to the emergency department with lethargy and hematemesis.  The patient is reported to be alert and interactive at his baseline but for the past 2 days has been lethargic and not speaking.  He was also noted to have an episode of vomiting with blood today, prompting presentation to the ED.   ED Course: Upon arrival to the ED, patient is found to be afebrile, saturating well on room air, and with stable blood pressure.  EKG features sinus rhythm with LVH.  Chest x-ray notable for interval enlargement of the cardiac silhouette as well as hazy Structure in the left upper quadrant and possible air distention of the esophagus.  CT head is negative for acute intracranial abnormality.  CT of the abdomen and pelvis demonstrates diffusely fluid-filled bowel with occasional fluid distention but no transition point.  Also noted on CT is nodular airspace disease in the right lower lobe concerning for pneumonia.  Chemistry panel features Phallon Haydu creatinine 1.84 and CBC notable for leukocytosis 12,800.  Gastroenterology was consulted by the ED physician and the patient was given IV fluids and IV PPI in the ED.  Patient strongly resisted attempt to place NG tube in the ED.    Assessment & Plan:   Principal Problem:   GI bleeding Active Problems:   Acute pseudo-obstruction of bowel   Pneumonia   Acute encephalopathy   Cardiomegaly   CKD (chronic kidney disease), stage III (HCC)   Schizoaffective disorder, bipolar type (HCC)   * GI bleeding- (present on admission) CT abd/pelvis fluid filled bowel,  findings concerning for enterocolitis Episode of hematemesis Appreciate GI c/s, recommending PPI BID and clear liquid diet Maintain potassium >4 and magnesium >2  Acute pseudo-obstruction of bowel- (present on admission) Continue lactulose 20 g TID prn, miralax BID, senna BID Continue K>4 and mag>2 KUB today with ileus, episode of emesis Plan for NGT to LIS - ativan ordered, he may need restraints (resisted this in the ED) NPO except meds   Pneumonia- (present on admission) Nodular airspace dz in RLL measuring 19 mm Likely pneumonia Recommending CT follow up  Continue ceftriaxone/azithromycin  Acute encephalopathy- (present on admission) Lethargy is improved Cognitive impairment and psychiatric illness at baseline Ammonia mildly elevated, will repeat TSH, b12, RPR wnl Lithium level WNL Valproic acid level wnl Home seroquel, depakote,lithium  Cardiomegaly- (present on admission) Noted on CXR Echo with EF 70-75%, no RWMA, mild LVH   CKD (chronic kidney disease), stage III ([place])- (present on admission) Creatinine appears close to baseline  Schizoaffective disorder, bipolar type ([place])- (present on admission) seroquel Nightly ambien   DVT prophylaxis: SCD Code Status: full Family Communication: none Disposition:   Status is: Inpatient  Remains inpatient appropriate because: need for IV abx, advancing diet       Consultants:  GI  Procedures:  none  Antimicrobials:  Anti-infectives (From admission, onward)    Start     Dose/Rate Route Frequency Ordered Stop   06/28/21 2245  cefTRIAXone (ROCEPHIN) 2 g in sodium chloride 0.9 % 100 mL IVPB        2 g 200 mL/hr over 30  Minutes Intravenous Every 24 hours 06/28/21 2238 07/03/21 2244   06/28/21 2245  azithromycin (ZITHROMAX) 500 mg in sodium chloride 0.9 % 250 mL IVPB        500 mg 250 mL/hr over 60 Minutes Intravenous Every 24 hours 06/28/21 2238 07/03/21 2244       Subjective: C/o vomiting    Objective: Vitals:   06/30/21 0447 06/30/21 0456 06/30/21 0458 06/30/21 1222  BP: (!) 159/116 (!) 164/98 (!) 174/98 (!) 159/90  Pulse: (!) 103   86  Resp: 18   20  Temp: 99.5 F (37.5 C)   99.5 F (37.5 C)  TempSrc: Oral   Oral  SpO2: 95%   97%  Weight:        Intake/Output Summary (Last 24 hours) at 06/30/2021 1516 Last data filed at 06/30/2021 1154 Gross per 24 hour  Intake 2006.51 ml  Output 315 ml  Net 1691.51 ml   Filed Weights   06/30/21 0443  Weight: 73 kg    Examination:  General: No acute distress. Cardiovascular: RRR Lungs: unlabored Abdomen: mild distension  Neurological: Alert and oriented 3. Moves all extremities 4 . Cranial nerves II through XII grossly intact. Skin: Warm and dry. No rashes or lesions. Extremities: No clubbing or cyanosis. No edema.    Data Reviewed: I have personally reviewed following labs and imaging studies  CBC: Recent Labs  Lab 06/28/21 2002 06/29/21 0002 06/29/21 0447 06/29/21 1712 06/30/21 0512  WBC 12.8*  --   --   --  10.6*  NEUTROABS 10.8*  --   --   --  7.7  HGB 14.0 13.2 13.7 12.4* 12.6*  HCT 43.4 41.2 41.9 39.2 39.9  MCV 91.9  --   --   --  93.4  PLT 250  --   --   --  449    Basic Metabolic Panel: Recent Labs  Lab 06/28/21 2002 06/29/21 0447 06/30/21 0512  NA 139 141 141  K 4.6 4.8 4.2  CL 107 106 104  CO2 21* 24 27  GLUCOSE 124* 108* 111*  BUN 18 18 15   CREATININE 1.84* 1.64* 1.44*  CALCIUM 9.3 8.7* 8.8*  MG  --  2.3 2.5*  PHOS  --   --  4.3    GFR: Estimated Creatinine Clearance: 48 mL/min (Fredia Chittenden) (by C-G formula based on SCr of 1.44 mg/dL (H)).  Liver Function Tests: Recent Labs  Lab 06/28/21 2002 06/29/21 0447 06/30/21 0512  AST 23 25 20   ALT 17 16 13   ALKPHOS 150* 128* 112  BILITOT 0.7 0.7 0.6  PROT 8.3* 7.8 7.3  ALBUMIN 4.3 3.8 3.6    CBG: No results for input(s): GLUCAP in the last 168 hours.   Recent Results (from the past 240 hour(s))  Resp Panel by RT-PCR (Flu Shere Eisenhart&B,  Covid) Nasopharyngeal Swab     Status: None   Collection Time: 06/28/21  8:03 PM   Specimen: Nasopharyngeal Swab; Nasopharyngeal(NP) swabs in vial transport medium  Result Value Ref Range Status   SARS Coronavirus 2 by RT PCR NEGATIVE NEGATIVE Final    Comment: (NOTE) SARS-CoV-2 target nucleic acids are NOT DETECTED.  The SARS-CoV-2 RNA is generally detectable in upper respiratory specimens during the acute phase of infection. The lowest concentration of SARS-CoV-2 viral copies this assay can detect is 138 copies/mL. Versie Fleener negative result does not preclude SARS-Cov-2 infection and should not be used as the sole basis for treatment or other patient management decisions. Taresa Montville negative result may occur with  improper specimen collection/handling, submission of specimen other than nasopharyngeal swab, presence of viral mutation(s) within the areas targeted by this assay, and inadequate number of viral copies(<138 copies/mL). Indi Willhite negative result must be combined with clinical observations, patient history, and epidemiological information. The expected result is Negative.  Fact Sheet for Patients:  EntrepreneurPulse.com.au  Fact Sheet for Healthcare Providers:  IncredibleEmployment.be  This test is no t yet approved or cleared by the [place] FDA and  has been authorized for detection and/or diagnosis of SARS-CoV-2 by FDA under an Emergency Use Authorization (EUA). This EUA will remain  in effect (meaning this test can be used) for the duration of the COVID-19 declaration under Section 564(b)(1) of the Act, 21 U.S.C.section 360bbb-3(b)(1), unless the authorization is terminated  or revoked sooner.       Influenza Alison Kubicki by PCR NEGATIVE NEGATIVE Final   Influenza B by PCR NEGATIVE NEGATIVE Final    Comment: (NOTE) The Xpert Xpress SARS-CoV-2/FLU/RSV plus assay is intended as an aid in the diagnosis of influenza from Nasopharyngeal swab specimens and should  not be used as Adeel Guiffre sole basis for treatment. Nasal washings and aspirates are unacceptable for Xpert Xpress SARS-CoV-2/FLU/RSV testing.  Fact Sheet for Patients: EntrepreneurPulse.com.au  Fact Sheet for Healthcare Providers: IncredibleEmployment.be  This test is not yet approved or cleared by the [place] FDA and has been authorized for detection and/or diagnosis of SARS-CoV-2 by FDA under an Emergency Use Authorization (EUA). This EUA will remain in effect (meaning this test can be used) for the duration of the COVID-19 declaration under Section 564(b)(1) of the Act, 21 U.S.C. section 360bbb-3(b)(1), unless the authorization is terminated or revoked.  Performed at [facility], [place] [address], [place], [place] [address]          Radiology Studies: DG Abd 1 View  Result Date: 06/30/2021 CLINICAL DATA:  Encounter for abdominal distention. EXAM: ABDOMEN - 1 VIEW COMPARISON:  KUB 04/20/2021 CT abdomen and pelvis 06/28/2021 FINDINGS: There is moderate stool within the rectum. Interval clearance of the stool previously seen within the descending colon on 04/20/2021 KUB. There is moderate distention of the ascending and transverse colon measuring up to approximately 10 cm in caliber, mildly decreased from 11 cm on 04/20/2021. On prior CT there appeared to be postsurgical change of left hemicolectomy. There is again mild distention of loops of bowel within the left hemiabdomen, measuring up to approximately 4.3 cm. No portal venous gas or pneumatosis, noting the superior aspect of the liver is not imaged. No definite free air is seen. No acute skeletal abnormality. IMPRESSION: Similar to recent 06/28/2021 CT, air is seen within the ascending and transverse colon and mildly distended loops of small bowel within the left hemiabdomen. This may be secondary to ileus. Electronically Signed   By: Yvonne Kendall M.D.   On: 06/30/2021 11:24    CT Head Wo Contrast  Result Date: 06/28/2021 CLINICAL DATA:  Mental status change, unknown cause EXAM: CT HEAD WITHOUT CONTRAST TECHNIQUE: Contiguous axial images were obtained from the base of the skull through the vertex without intravenous contrast. RADIATION DOSE REDUCTION: This exam was performed according to the departmental dose-optimization program which includes automated exposure control, adjustment of the mA and/or kV according to patient size and/or use of iterative reconstruction technique. COMPARISON:  None. FINDINGS: Brain: No acute intracranial abnormality. Specifically, no hemorrhage, hydrocephalus, mass lesion, acute infarction, or significant intracranial injury. Vascular: No hyperdense vessel or unexpected calcification. Skull: No acute calvarial abnormality. Sinuses/Orbits: No  acute findings Other: None IMPRESSION: No acute intracranial abnormality. Electronically Signed   By: Rolm Baptise M.D.   On: 06/28/2021 21:37   CT Abdomen Pelvis W Contrast  Result Date: 06/28/2021 CLINICAL DATA:  Acute abdominal pain. Lethargy. Episode of vomiting. EXAM: CT ABDOMEN AND PELVIS WITH CONTRAST TECHNIQUE: Multidetector CT imaging of the abdomen and pelvis was performed using the standard protocol following bolus administration of intravenous contrast. RADIATION DOSE REDUCTION: This exam was performed according to the departmental dose-optimization program which includes automated exposure control, adjustment of the mA and/or kV according to patient size and/or use of iterative reconstruction technique. CONTRAST:  21mL OMNIPAQUE IOHEXOL 350 MG/ML SOLN COMPARISON:  None. FINDINGS: Lower chest: Dilated fluid-filled distal esophagus. Nodular airspace disease in the right lower lobe measures 19 mm, series 4, image 21. Hepatobiliary: No focal liver abnormality is seen. No gallstones, gallbladder wall thickening, or biliary dilatation. Pancreas: No pancreatic inflammation or evidence of mass. Slight  prominence of the proximal pancreatic duct at 3 mm. Spleen: Normal in size without focal abnormality. Adrenals/Urinary Tract: Normal adrenal glands. Mild right renal scarring, query postsurgical change in the mid right kidney. No hydronephrosis. 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 No hydronephrosis. No visualized renal calculi. Mild anterior bladder wall thickening. Stomach/Bowel: Detailed bowel assessment is limited in the absence of enteric contrast and paucity of intra-abdominal fat. Dilated fluid-filled distal esophagus. The stomach is distended with fluid/ingested contents. Fluid-filled duodenum and small bowel. Proximal small bowel is mildly dilated. The distal small bowel is less dilated but also fluid-filled. Enteric sutures in the sigmoid colon with suspected left hemicolectomy. There also enteric sutures about cecum. The ascending and transverse colon are fluid-filled, transverse colon is also gaseous distended. Mild diffuse bowel enhancement. Distal sigmoid colon and rectum are nondistended, equivocal wall thickening. There is no other significant small or large bowel wall thickening or inflammatory change. Appendix not visualized, presumably surgically absent. Scattered mesenteric surgical clips. Vascular/Lymphatic: Normal caliber abdominal aorta. The portal vein is patent. There is no portal venous or mesenteric gas. 7 mm left external iliac node, not enlarged by size criteria. There are no enlarged lymph nodes in the abdomen or pelvis. Reproductive: Prostate is unremarkable. Other: No free air or ascites.  No abdominal wall hernia. Musculoskeletal: There are no acute or suspicious osseous abnormalities. Mild L5-S1 degenerative disc disease. Lower lumbar facet hypertrophy. IMPRESSION: 1. Diffusely fluid-filled bowel,. There is occasional fluid distension of the GI track including the distal esophagus and stomach, proximal small bowel,  ascending and transverse colon. Additional areas of bowel are fluid-filled but less dilated. No discrete transition point to suggest obstruction. Overall findings are suspicious for generalized enterocolitis. 2. Nodular airspace disease in the right lower lobe measuring 19 mm. This is likely pneumonia, although CT follow-up is recommended after course of treatment to ensure resolution. This is not amenable to radiographic follow-up, as it was not seen on chest radiograph earlier today. 3. Mild anterior bladder wall thickening, recommend correlation with urinalysis to exclude urinary tract infection. 4. 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 Mild right renal scarring, query postsurgical change. Electronically Signed   By: Keith Rake M.D.   On: 06/28/2021 21:45   DG Chest Port 1 View  Result Date: 06/28/2021 CLINICAL DATA:  Altered mental status EXAM: PORTABLE CHEST 1 VIEW COMPARISON:  04/20/2021 FINDINGS: Interval moderate to marked cardiomegaly. No pleural effusion, consolidation, or pneumothorax. Hazy gas collection in the left upper quadrant probably  reflects dilated stomach. Probable air distension of esophagus. IMPRESSION: 1. Interval cardiomegaly which may be due to chamber enlargement or pericardial effusion 2. Hazy gas structure in the left upper quadrant suspected to represent air distended stomach, there is possible air distension of esophagus. Recommend dedicated abdominal radiographs Electronically Signed   By: Donavan Foil M.D.   On: 06/28/2021 20:50   ECHOCARDIOGRAM COMPLETE  Result Date: 06/29/2021    ECHOCARDIOGRAM REPORT   Patient Name:   BIRNEY BELSHE Date of Exam: 06/29/2021 Medical Rec #:  [id number]    Height:       66.0 in Accession #:    [id number]   Weight:       141.0 lb Date of Birth:  Mar 03, 1961   BSA:          1.724 m Patient Age:    32 years     BP:           137/117 mmHg Patient Gender: M            HR:           85 bpm. Exam Location:  Inpatient Procedure: 2D Echo  Indications:    Cardiomegaly  History:        Patient has no prior history of Echocardiogram examinations.                 Risk Factors:Hypertension.  Sonographer:    Arlyss Gandy Referring Phys: [id number] TIMOTHY S OPYD  Sonographer Comments: PLAX measurements at end of study due to patient condition. IMPRESSIONS  1. Left ventricular ejection fraction, by estimation, is 70 to 75%. The left ventricle has hyperdynamic function. The left ventricle has no regional wall motion abnormalities. There is mild left ventricular hypertrophy of the basal-septal segment. Left ventricular diastolic parameters were normal.  2. Right ventricular systolic function is normal. The right ventricular size is normal.  3. The mitral valve is normal in structure. Trivial mitral valve regurgitation. No evidence of mitral stenosis.  4. The aortic valve is normal in structure. Aortic valve regurgitation is not visualized. No aortic stenosis is present. Aortic valve area, by VTI measures 2.84 cm. Aortic valve mean gradient measures 6.0 mmHg. Aortic valve Vmax measures 1.62 m/s. FINDINGS  Left Ventricle: Left ventricular ejection fraction, by estimation, is 70 to 75%. The left ventricle has hyperdynamic function. The left ventricle has no regional wall motion abnormalities. The left ventricular internal cavity size was normal in size. There is mild left ventricular hypertrophy of the basal-septal segment. Left ventricular diastolic parameters were normal. Normal left ventricular filling pressure. Right Ventricle: The right ventricular size is normal. No increase in right ventricular wall thickness. Right ventricular systolic function is normal. Left Atrium: Left atrial size was normal in size. Right Atrium: Right atrial size was normal in size. Pericardium: There is no evidence of pericardial effusion. Mitral Valve: The mitral valve is normal in structure. Trivial mitral valve regurgitation. No evidence of mitral valve stenosis. Tricuspid  Valve: The tricuspid valve is normal in structure. Tricuspid valve regurgitation is trivial. No evidence of tricuspid stenosis. Aortic Valve: The aortic valve is normal in structure. Aortic valve regurgitation is not visualized. No aortic stenosis is present. Aortic valve mean gradient measures 6.0 mmHg. Aortic valve peak gradient measures 10.5 mmHg. Aortic valve area, by VTI measures 2.84 cm. Pulmonic Valve: The pulmonic valve was normal in structure. Pulmonic valve regurgitation is trivial. No evidence of pulmonic stenosis. Aorta: The aortic root is normal in size and structure. Venous:  The inferior vena cava was not well visualized. IAS/Shunts: No atrial level shunt detected by color flow Doppler.  LEFT VENTRICLE PLAX 2D LVIDd:         3.40 cm   Diastology LVIDs:         2.10 cm   LV e' medial:    9.36 cm/s LV PW:         1.10 cm   LV E/e' medial:  7.9 LV IVS:        1.20 cm   LV e' lateral:   8.92 cm/s LVOT diam:     2.00 cm   LV E/e' lateral: 8.3 LV SV:         79 LV SV Index:   46 LVOT Area:     3.14 cm  RIGHT VENTRICLE RV Basal diam:  3.40 cm RV Mid diam:    3.50 cm RV S prime:     17.00 cm/s TAPSE (M-mode): 2.5 cm LEFT ATRIUM             Index        RIGHT ATRIUM           Index LA diam:        3.50 cm 2.03 cm/m   RA Area:     13.60 cm LA Vol (A2C):   56.7 ml 32.89 ml/m  RA Volume:   32.10 ml  18.62 ml/m LA Vol (A4C):   31.2 ml 18.10 ml/m LA Biplane Vol: 42.9 ml 24.89 ml/m  AORTIC VALVE AV Area (Vmax):    2.79 cm AV Area (Vmean):   2.62 cm AV Area (VTI):     2.84 cm AV Vmax:           162.00 cm/s AV Vmean:          113.000 cm/s AV VTI:            0.280 m AV Peak Grad:      10.5 mmHg AV Mean Grad:      6.0 mmHg LVOT Vmax:         144.00 cm/s LVOT Vmean:        94.300 cm/s LVOT VTI:          0.253 m LVOT/AV VTI ratio: 0.90  AORTA Ao Root diam: 2.80 cm Ao Asc diam:  3.20 cm MITRAL VALVE               TRICUSPID VALVE MV Area (PHT): 4.10 cm    TR Peak grad:   24.6 mmHg MV Decel Time: 185 msec    TR  Vmax:        248.00 cm/s MV E velocity: 73.80 cm/s MV Brinly Maietta velocity: 81.00 cm/s  SHUNTS MV E/Galdino Hinchman ratio:  0.91        Systemic VTI:  0.25 m                            Systemic Diam: 2.00 cm Fransico Him MD Electronically signed by Fransico Him MD Signature Date/Time: 06/29/2021/3:12:16 PM    Final         Scheduled Meds:  divalproex  1,000 mg Oral QHS   haloperidol  20 mg Oral TID   lithium carbonate  150 mg Oral Daily   lithium carbonate  300 mg Oral QHS   pantoprazole  40 mg Oral BID   polyethylene glycol  17 g Oral BID   propranolol  10 mg Oral TID  QUEtiapine  400 mg Oral BID   senna-docusate  2 tablet Oral BID   sodium chloride flush  3 mL Intravenous Q12H   zolpidem  10 mg Oral QHS   Continuous Infusions:  azithromycin 500 mg (06/29/21 2112)   cefTRIAXone (ROCEPHIN)  IV 2 g (06/29/21 2310)     LOS: 2 days    Time spent: over 30 min    Fayrene Helper, MD [facility]   To contact the attending provider between 7A-7P or the covering provider during after hours 7P-7A, please log into the web site [email or web address] and access using universal [place] password for that web site. If you do not have the password, please call the hospital operator.  06/30/2021, 3:16 PM

## 2021-07-01 ENCOUNTER — Inpatient Hospital Stay (HOSPITAL_COMMUNITY): Payer: Medicaid Other

## 2021-07-01 LAB — COMPREHENSIVE METABOLIC PANEL
ALT: 12 U/L (ref 0–44)
AST: 16 U/L (ref 15–41)
Albumin: 3.2 g/dL — ABNORMAL LOW (ref 3.5–5.0)
Alkaline Phosphatase: 92 U/L (ref 38–126)
Anion gap: 10 (ref 5–15)
BUN: 18 mg/dL (ref 8–23)
CO2: 25 mmol/L (ref 22–32)
Calcium: 8.7 mg/dL — ABNORMAL LOW (ref 8.9–10.3)
Chloride: 106 mmol/L (ref 98–111)
Creatinine, Ser: 1.5 mg/dL — ABNORMAL HIGH (ref 0.61–1.24)
GFR, Estimated: 52 mL/min — ABNORMAL LOW (ref >60)
Glucose, Bld: 93 mg/dL (ref 70–99)
Potassium: 3.9 mmol/L (ref 3.5–5.1)
Sodium: 141 mmol/L (ref 135–145)
Total Bilirubin: 0.6 mg/dL (ref 0.3–1.2)
Total Protein: 6.6 g/dL (ref 6.5–8.1)

## 2021-07-01 LAB — CBC WITH DIFFERENTIAL/PLATELET
Abs Immature Granulocytes: 0.03 10*3/uL (ref 0.00–0.07)
Basophils Absolute: 0 10*3/uL (ref 0.0–0.1)
Basophils Relative: 0 %
Eosinophils Absolute: 0.1 10*3/uL (ref 0.0–0.5)
Eosinophils Relative: 1 %
HCT: 38.6 % — ABNORMAL LOW (ref 39.0–52.0)
Hemoglobin: 12.3 g/dL — ABNORMAL LOW (ref 13.0–17.0)
Immature Granulocytes: 0 %
Lymphocytes Relative: 18 %
Lymphs Abs: 1.3 10*3/uL (ref 0.7–4.0)
MCH: 29.4 pg (ref 26.0–34.0)
MCHC: 31.9 g/dL (ref 30.0–36.0)
MCV: 92.3 fL (ref 80.0–100.0)
Monocytes Absolute: 1.1 10*3/uL — ABNORMAL HIGH (ref 0.1–1.0)
Monocytes Relative: 15 %
Neutro Abs: 4.7 10*3/uL (ref 1.7–7.7)
Neutrophils Relative %: 66 %
Platelets: 176 10*3/uL (ref 150–400)
RBC: 4.18 MIL/uL — ABNORMAL LOW (ref 4.22–5.81)
RDW: 15.5 % (ref 11.5–15.5)
WBC: 7.2 10*3/uL (ref 4.0–10.5)
nRBC: 0 % (ref 0.0–0.2)

## 2021-07-01 LAB — MAGNESIUM: Magnesium: 2.4 mg/dL (ref 1.7–2.4)

## 2021-07-01 LAB — PHOSPHORUS: Phosphorus: 4.7 mg/dL — ABNORMAL HIGH (ref 2.5–4.6)

## 2021-07-01 MED ORDER — LIP MEDEX EX OINT
TOPICAL_OINTMENT | CUTANEOUS | Status: DC | PRN
  Administered 2021-07-01: 75 via TOPICAL
  Filled 2021-07-01: qty 7

## 2021-07-01 MED ORDER — POTASSIUM CHLORIDE 2 MEQ/ML IV SOLN
INTRAVENOUS | Status: DC
  Filled 2021-07-01 (×21): qty 1000

## 2021-07-01 MED ORDER — POLYETHYLENE GLYCOL 3350 17 G PO PACK
17.0000 g | PACK | Freq: Three times a day (TID) | ORAL | Status: DC
  Administered 2021-07-01 – 2021-07-08 (×19): 17 g via ORAL
  Filled 2021-07-01 (×18): qty 1

## 2021-07-01 NOTE — Progress Notes (Signed)
PT Cancellation Note  Patient Details Name: Nicholas Oconnell MRN: [id number] DOB: 09-Oct-1960   Cancelled Treatment:     PT order received and eval attempted this am.  RN advises pt is finally asleep and eval deferred.  Will follow as schedule allows.   Lailie Smead 07/01/2021, 4:11 PM

## 2021-07-01 NOTE — Progress Notes (Signed)
Subjective: NG tube with wall suction contains 200 cc of dark bilious fluid. Although patient states his last bowel movement was yesterday evening, as per nurse last bowel movement was on 06/29/2021. Patient has not had any further episodes of vomiting. He denies abdominal pain.  Objective: Vital signs in last 24 hours: Temp:  [99.1 F (37.3 C)-99.8 F (37.7 C)] 99.1 F (37.3 C) (01/28 0606) Pulse Rate:  [84-88] 84 (01/28 0606) Resp:  [14-20] 16 (01/28 0606) BP: (144-159)/(90-115) 144/100 (01/28 0606) SpO2:  [97 %-100 %] 100 % (01/28 0606) Weight:  [72.8 kg] 72.8 kg (01/28 0500) Weight change: -0.2 kg Last BM Date: 06/30/21  PE: Patient in restraints and NG tube to low intermittent wall suction GENERAL: Not in distress  ABDOMEN: Remains distended, multiple well-healed surgical scars noted, bowel sounds not audible EXTREMITIES: No deformity  Lab Results: Results for orders placed or performed during the hospital encounter of 06/28/21 (from the past 48 hour(s))  Hemoglobin and hematocrit, blood     Status: Abnormal   Collection Time: 06/29/21  5:12 PM  Result Value Ref Range   Hemoglobin 12.4 (L) 13.0 - 17.0 g/dL   HCT 39.2 39.0 - 52.0 %    Comment: Performed at [facility], [place] [address], [place], [place] [address]  CBC with Differential/Platelet     Status: Abnormal   Collection Time: 06/30/21  5:12 AM  Result Value Ref Range   WBC 10.6 (H) 4.0 - 10.5 K/uL   RBC 4.27 4.22 - 5.81 MIL/uL   Hemoglobin 12.6 (L) 13.0 - 17.0 g/dL   HCT 39.9 39.0 - 52.0 %   MCV 93.4 80.0 - 100.0 fL   MCH 29.5 26.0 - 34.0 pg   MCHC 31.6 30.0 - 36.0 g/dL   RDW 15.8 (H) 11.5 - 15.5 %   Platelets 205 150 - 400 K/uL   nRBC 0.0 0.0 - 0.2 %   Neutrophils Relative % 73 %   Neutro Abs 7.7 1.7 - 7.7 K/uL   Lymphocytes Relative 11 %   Lymphs Abs 1.2 0.7 - 4.0 K/uL   Monocytes Relative 14 %   Monocytes Absolute 1.5 (H) 0.1 - 1.0 K/uL   Eosinophils Relative 1 %   Eosinophils  Absolute 0.1 0.0 - 0.5 K/uL   Basophils Relative 0 %   Basophils Absolute 0.0 0.0 - 0.1 K/uL   Immature Granulocytes 1 %   Abs Immature Granulocytes 0.06 0.00 - 0.07 K/uL    Comment: Performed at [facility], [place] [address] [place], [place] [address]  Comprehensive metabolic panel     Status: Abnormal   Collection Time: 06/30/21  5:12 AM  Result Value Ref Range   Sodium 141 135 - 145 mmol/L   Potassium 4.2 3.5 - 5.1 mmol/L   Chloride 104 98 - 111 mmol/L   CO2 27 22 - 32 mmol/L   Glucose, Bld 111 (H) 70 - 99 mg/dL    Comment: Glucose reference range applies only to samples taken after fasting for at least 8 hours.   BUN 15 8 - 23 mg/dL   Creatinine, Ser 1.44 (H) 0.61 - 1.24 mg/dL   Calcium 8.8 (L) 8.9 - 10.3 mg/dL   Total Protein 7.3 6.5 - 8.1 g/dL   Albumin 3.6 3.5 - 5.0 g/dL   AST 20 15 - 41 U/L   ALT 13 0 - 44 U/L   Alkaline Phosphatase 112 38 - 126 U/L   Total Bilirubin 0.6 0.3 - 1.2  mg/dL   GFR, Estimated 55 (L) >60 mL/min    Comment: (NOTE) Calculated using the CKD-EPI Creatinine Equation (2021)    Anion gap 10 5 - 15    Comment: Performed at [facility], [place] [address] [place], [place] [address]  Magnesium     Status: Abnormal   Collection Time: 06/30/21  5:12 AM  Result Value Ref Range   Magnesium 2.5 (H) 1.7 - 2.4 mg/dL    Comment: Performed at [facility], [place] [address], [place], [place] [address]  Phosphorus     Status: None   Collection Time: 06/30/21  5:12 AM  Result Value Ref Range   Phosphorus 4.3 2.5 - 4.6 mg/dL    Comment: Performed at [facility], [place] [address] [place], [place] [address]  CBC with Differential/Platelet     Status: Abnormal   Collection Time: 07/01/21  5:34 AM  Result Value Ref Range   WBC 7.2 4.0 - 10.5 K/uL   RBC 4.18 (L) 4.22 - 5.81 MIL/uL   Hemoglobin 12.3 (L) 13.0 - 17.0 g/dL   HCT 38.6 (L) 39.0 - 52.0 %   MCV 92.3 80.0 - 100.0 fL   MCH 29.4 26.0 -  34.0 pg   MCHC 31.9 30.0 - 36.0 g/dL   RDW 15.5 11.5 - 15.5 %   Platelets 176 150 - 400 K/uL   nRBC 0.0 0.0 - 0.2 %   Neutrophils Relative % 66 %   Neutro Abs 4.7 1.7 - 7.7 K/uL   Lymphocytes Relative 18 %   Lymphs Abs 1.3 0.7 - 4.0 K/uL   Monocytes Relative 15 %   Monocytes Absolute 1.1 (H) 0.1 - 1.0 K/uL   Eosinophils Relative 1 %   Eosinophils Absolute 0.1 0.0 - 0.5 K/uL   Basophils Relative 0 %   Basophils Absolute 0.0 0.0 - 0.1 K/uL   Immature Granulocytes 0 %   Abs Immature Granulocytes 0.03 0.00 - 0.07 K/uL    Comment: Performed at [facility], [place] [address] [place], [place] [address]  Comprehensive metabolic panel     Status: Abnormal   Collection Time: 07/01/21  5:34 AM  Result Value Ref Range   Sodium 141 135 - 145 mmol/L   Potassium 3.9 3.5 - 5.1 mmol/L   Chloride 106 98 - 111 mmol/L   CO2 25 22 - 32 mmol/L   Glucose, Bld 93 70 - 99 mg/dL    Comment: Glucose reference range applies only to samples taken after fasting for at least 8 hours.   BUN 18 8 - 23 mg/dL   Creatinine, Ser 1.50 (H) 0.61 - 1.24 mg/dL   Calcium 8.7 (L) 8.9 - 10.3 mg/dL   Total Protein 6.6 6.5 - 8.1 g/dL   Albumin 3.2 (L) 3.5 - 5.0 g/dL   AST 16 15 - 41 U/L   ALT 12 0 - 44 U/L   Alkaline Phosphatase 92 38 - 126 U/L   Total Bilirubin 0.6 0.3 - 1.2 mg/dL   GFR, Estimated 52 (L) >60 mL/min    Comment: (NOTE) Calculated using the CKD-EPI Creatinine Equation (2021)    Anion gap 10 5 - 15    Comment: Performed at [facility], [place] [address] [place], [place] [address]  Magnesium     Status: None   Collection Time: 07/01/21  5:34 AM  Result Value Ref Range   Magnesium 2.4 1.7 - 2.4 mg/dL    Comment: Performed at  [facility], [place] [address] [place], [place] [address]  Phosphorus     Status: Abnormal   Collection Time: 07/01/21  5:34 AM  Result Value Ref Range   Phosphorus 4.7 (H) 2.5 - 4.6 mg/dL    Comment: Performed at [facility], [place] [address] [place], [place] [address]    Studies/Results: DG Abd 1 View  Result Date: 07/01/2021 CLINICAL DATA:  Follow-up ileus EXAM: ABDOMEN - 1 VIEW COMPARISON:  June 30, 2021. FINDINGS: Nasogastric tube with tip and side port overlying the left upper quadrant. Left-sided abdominal surgical clips. Similar gaseous dilation large bowel with the transverse colon measuring up to 9.5 cm and no gas in the rectal vault. IMPRESSION: Similar gaseous dilation of large bowel with no gas in the rectal wall. Stable nasogastric tube Electronically Signed   By: Dahlia Bailiff M.D.   On: 07/01/2021 08:25   DG Abd 1 View  Result Date: 06/30/2021 CLINICAL DATA:  Encounter for abdominal distention. EXAM: ABDOMEN - 1 VIEW COMPARISON:  KUB 04/20/2021 CT abdomen and pelvis 06/28/2021 FINDINGS: There is moderate stool within the rectum. Interval clearance of the stool previously seen within the descending colon on 04/20/2021 KUB. There is moderate distention of the ascending and transverse colon measuring up to approximately 10 cm in caliber, mildly decreased from 11 cm on 04/20/2021. On prior CT there appeared to be postsurgical change of left hemicolectomy. There is again mild distention of loops of bowel within the left hemiabdomen, measuring up to approximately 4.3 cm. No portal venous gas or pneumatosis, noting the superior aspect of the liver is not imaged. No definite free air is seen. No acute skeletal abnormality. IMPRESSION: Similar to recent 06/28/2021 CT, air is seen within the ascending and transverse colon and mildly distended loops of small bowel within the left hemiabdomen. This may be secondary to ileus. Electronically Signed   By: Yvonne Kendall M.D.   On: 06/30/2021 11:24   DG Abd Portable 1V  Result Date: 06/30/2021 CLINICAL DATA:  NG tube placement. EXAM: PORTABLE ABDOMEN - 1 VIEW COMPARISON:  Abdominal x-ray 06/30/2021. FINDINGS: The tip of the nasogastric tube is in the  mid/proximal stomach. Dilatation of the colon has not significantly changed. There surgical clips in the left abdomen. Lung bases are grossly clear. No acute fractures. IMPRESSION: 1. Nasogastric tube tip in the mid/proximal stomach. 2. Stable dilated colon. Electronically Signed   By: Ronney Asters M.D.   On: 06/30/2021 18:26   ECHOCARDIOGRAM COMPLETE  Result Date: 06/29/2021    ECHOCARDIOGRAM REPORT   Patient Name:   Nicholas Oconnell Date of Exam: 06/29/2021 Medical Rec #:  [id number]    Height:       66.0 in Accession #:    [id number]   Weight:       141.0 lb Date of Birth:  27-Nov-1960   BSA:          1.724 m Patient Age:    61 years     BP:           137/117 mmHg Patient Gender: M            HR:           85 bpm. Exam Location:  Inpatient Procedure: 2D Echo Indications:    Cardiomegaly  History:        Patient has no prior history of Echocardiogram examinations.  Risk Factors:Hypertension.  Sonographer:    Arlyss Gandy Referring Phys: [id number] TIMOTHY S OPYD  Sonographer Comments: PLAX measurements at end of study due to patient condition. IMPRESSIONS  1. Left ventricular ejection fraction, by estimation, is 70 to 75%. The left ventricle has hyperdynamic function. The left ventricle has no regional wall motion abnormalities. There is mild left ventricular hypertrophy of the basal-septal segment. Left ventricular diastolic parameters were normal.  2. Right ventricular systolic function is normal. The right ventricular size is normal.  3. The mitral valve is normal in structure. Trivial mitral valve regurgitation. No evidence of mitral stenosis.  4. The aortic valve is normal in structure. Aortic valve regurgitation is not visualized. No aortic stenosis is present. Aortic valve area, by VTI measures 2.84 cm. Aortic valve mean gradient measures 6.0 mmHg. Aortic valve Vmax measures 1.62 m/s. FINDINGS  Left Ventricle: Left ventricular ejection fraction, by estimation, is 70 to 75%. The left ventricle  has hyperdynamic function. The left ventricle has no regional wall motion abnormalities. The left ventricular internal cavity size was normal in size. There is mild left ventricular hypertrophy of the basal-septal segment. Left ventricular diastolic parameters were normal. Normal left ventricular filling pressure. Right Ventricle: The right ventricular size is normal. No increase in right ventricular wall thickness. Right ventricular systolic function is normal. Left Atrium: Left atrial size was normal in size. Right Atrium: Right atrial size was normal in size. Pericardium: There is no evidence of pericardial effusion. Mitral Valve: The mitral valve is normal in structure. Trivial mitral valve regurgitation. No evidence of mitral valve stenosis. Tricuspid Valve: The tricuspid valve is normal in structure. Tricuspid valve regurgitation is trivial. No evidence of tricuspid stenosis. Aortic Valve: The aortic valve is normal in structure. Aortic valve regurgitation is not visualized. No aortic stenosis is present. Aortic valve mean gradient measures 6.0 mmHg. Aortic valve peak gradient measures 10.5 mmHg. Aortic valve area, by VTI measures 2.84 cm. Pulmonic Valve: The pulmonic valve was normal in structure. Pulmonic valve regurgitation is trivial. No evidence of pulmonic stenosis. Aorta: The aortic root is normal in size and structure. Venous: The inferior vena cava was not well visualized. IAS/Shunts: No atrial level shunt detected by color flow Doppler.  LEFT VENTRICLE PLAX 2D LVIDd:         3.40 cm   Diastology LVIDs:         2.10 cm   LV e' medial:    9.36 cm/s LV PW:         1.10 cm   LV E/e' medial:  7.9 LV IVS:        1.20 cm   LV e' lateral:   8.92 cm/s LVOT diam:     2.00 cm   LV E/e' lateral: 8.3 LV SV:         79 LV SV Index:   46 LVOT Area:     3.14 cm  RIGHT VENTRICLE RV Basal diam:  3.40 cm RV Mid diam:    3.50 cm RV S prime:     17.00 cm/s TAPSE (M-mode): 2.5 cm LEFT ATRIUM             Index         RIGHT ATRIUM           Index LA diam:        3.50 cm 2.03 cm/m   RA Area:     13.60 cm LA Vol (A2C):   56.7 ml 32.89 ml/m  RA Volume:  32.10 ml  18.62 ml/m LA Vol (A4C):   31.2 ml 18.10 ml/m LA Biplane Vol: 42.9 ml 24.89 ml/m  AORTIC VALVE AV Area (Vmax):    2.79 cm AV Area (Vmean):   2.62 cm AV Area (VTI):     2.84 cm AV Vmax:           162.00 cm/s AV Vmean:          113.000 cm/s AV VTI:            0.280 m AV Peak Grad:      10.5 mmHg AV Mean Grad:      6.0 mmHg LVOT Vmax:         144.00 cm/s LVOT Vmean:        94.300 cm/s LVOT VTI:          0.253 m LVOT/AV VTI ratio: 0.90  AORTA Ao Root diam: 2.80 cm Ao Asc diam:  3.20 cm MITRAL VALVE               TRICUSPID VALVE MV Area (PHT): 4.10 cm    TR Peak grad:   24.6 mmHg MV Decel Time: 185 msec    TR Vmax:        248.00 cm/s MV E velocity: 73.80 cm/s MV A velocity: 81.00 cm/s  SHUNTS MV E/A ratio:  0.91        Systemic VTI:  0.25 m                            Systemic Diam: 2.00 cm Fransico Him MD Electronically signed by Fransico Him MD Signature Date/Time: 06/29/2021/3:12:16 PM    Final     Medications: I have reviewed the patient's current medications.  Assessment: Ileus-abdominal x-ray shows similar gaseous dilation of large bowel with no gas and rectal wall, transverse colon measuring up to 9.5 cm  CT scan of abdomen pelvis with contrast from 06/28/2021 showed dilated fluid-filled esophagus, distended stomach, fluid-filled duodenum and small bowel, dilated proximal small bowel, less dilated distal small bowel, dilated ascending, transverse  Potassium 3.9 Magnesium 2.4 BUN/creatinine/GFR 18/1.5/52 No leukocytosis, normal hemoglobin, stable at 12.3  Plan: Continue NG tube to intermittent suction, plan repeat abdominal x-ray in a.m., keep n.p.o. 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 This can be minimized  with,decreased use of narcotics, early mobilization, keeping potassium at least above 4-4.5 and magnesium above 2.  Ronnette Juniper, MD 07/01/2021, 10:18 AM

## 2021-07-01 NOTE — Progress Notes (Signed)
PROGRESS NOTE    Nicholas Oconnell  ION:[id number] DOB: 1960-08-25 DOA: 06/28/2021 PCP: Neale Burly, MD  Chief Complaint  Patient presents with   Altered Mental Status   Fatigue   Hemoptysis    Brief Narrative:  Nicholas Oconnell is Nicholas Oconnell pleasant 61 y.o. male with medical history significant for schizoaffective disorder, CKD stage III, hypertension, Ogilvie syndrome, history of partial colectomy, now presenting to the emergency department with lethargy and hematemesis.  The patient is reported to be alert and interactive at his baseline but for the past 2 days has been lethargic and not speaking.  He was also noted to have an episode of vomiting with blood today, prompting presentation to the ED.   ED Course: Upon arrival to the ED, patient is found to be afebrile, saturating well on room air, and with stable blood pressure.  EKG features sinus rhythm with LVH.  Chest x-ray notable for interval enlargement of the cardiac silhouette as well as hazy Structure in the left upper quadrant and possible air distention of the esophagus.  CT head is negative for acute intracranial abnormality.  CT of the abdomen and pelvis demonstrates diffusely fluid-filled bowel with occasional fluid distention but no transition point.  Also noted on CT is nodular airspace disease in the right lower lobe concerning for pneumonia.  Chemistry panel features Nicholas Oconnell creatinine 1.84 and CBC notable for leukocytosis 12,800.  Gastroenterology was consulted by the ED physician and the patient was given IV fluids and IV PPI in the ED.  Patient strongly resisted attempt to place NG tube in the ED.    Assessment & Plan:   Principal Problem:   GI bleeding Active Problems:   Acute pseudo-obstruction of bowel   Pneumonia   Acute encephalopathy   Cardiomegaly   CKD (chronic kidney disease), stage III (HCC)   Schizoaffective disorder, bipolar type (HCC)   * GI bleeding- (present on admission) CT abd/pelvis fluid filled bowel,  findings concerning for enterocolitis Episode of hematemesis Appreciate GI c/s, recommending PPI BID  Maintain potassium >4 and magnesium >2  Acute pseudo-obstruction of bowel- (present on admission) Continue lactulose 20 g TID prn, miralax TID for now per GI, senna BID Continue K>4 and mag>2 KUB with gaseous dilation of large bowel  NGT to LIS, needs restraints due to impulsivity, will pull out tube NPO except meds   Pneumonia- (present on admission) Nodular airspace dz in RLL measuring 19 mm Likely pneumonia Recommending CT follow up  Continue ceftriaxone/azithromycin  Acute encephalopathy- (present on admission) Lethargy is improved Cognitive impairment and psychiatric illness at baseline Ammonia mildly elevated, will repeat TSH, b12, RPR wnl Lithium level WNL Valproic acid level wnl Home seroquel, depakote,lithium  Cardiomegaly- (present on admission) Noted on CXR Echo with EF 70-75%, no RWMA, mild LVH   CKD (chronic kidney disease), stage III (HCC)- (present on admission) Creatinine appears close to baseline  Schizoaffective disorder, bipolar type (Nicholas Oconnell)- (present on admission) seroquel Nightly ambien   DVT prophylaxis: SCD Code Status: full Family Communication: none Disposition:   Status is: Inpatient  Remains inpatient appropriate because: need for IV abx, advancing diet       Consultants:  GI  Procedures:  none  Antimicrobials:  Anti-infectives (From admission, onward)    Start     Dose/Rate Route Frequency Ordered Stop   06/28/21 2245  cefTRIAXone (ROCEPHIN) 2 g in sodium chloride 0.9 % 100 mL IVPB        2 g 200 mL/hr over 30 Minutes Intravenous Every  24 hours 06/28/21 2238 07/03/21 2244   06/28/21 2245  azithromycin (ZITHROMAX) 500 mg in sodium chloride 0.9 % 250 mL IVPB        500 mg 250 mL/hr over 60 Minutes Intravenous Every 24 hours 06/28/21 2238 07/03/21 2244       Subjective: Asking for something to  drink  Objective: Vitals:   06/30/21 1914 06/30/21 2059 07/01/21 0500 07/01/21 0606  BP: (!) 149/96 (!) 151/115  (!) 144/100  Pulse: 84 88  84  Resp: 14   16  Temp: 99.8 F (37.7 C)   99.1 F (37.3 C)  TempSrc: Oral   Oral  SpO2: 97%   100%  Weight:   72.8 kg     Intake/Output Summary (Last 24 hours) at 07/01/2021 1434 Last data filed at 07/01/2021 0727 Gross per 24 hour  Intake 405 ml  Output 1475 ml  Net -1070 ml   Filed Weights   06/30/21 0443 07/01/21 0500  Weight: 73 kg 72.8 kg    Examination:  General: No acute distress. Cardiovascular: RRR Lungs: unlabored Abdomen: Soft, nontender, mildly distended Neurological: sleepy, but awakens, asking for something to drink Extremities: bilateral wrist restraints     Data Reviewed: I have personally reviewed following labs and imaging studies  CBC: Recent Labs  Lab 06/28/21 2002 06/29/21 0002 06/29/21 0447 06/29/21 1712 06/30/21 0512 07/01/21 0534  WBC 12.8*  --   --   --  10.6* 7.2  NEUTROABS 10.8*  --   --   --  7.7 4.7  HGB 14.0 13.2 13.7 12.4* 12.6* 12.3*  HCT 43.4 41.2 41.9 39.2 39.9 38.6*  MCV 91.9  --   --   --  93.4 92.3  PLT 250  --   --   --  205 814    Basic Metabolic Panel: Recent Labs  Lab 06/28/21 2002 06/29/21 0447 06/30/21 0512 07/01/21 0534  NA 139 141 141 141  K 4.6 4.8 4.2 3.9  CL 107 106 104 106  CO2 21* 24 27 25   GLUCOSE 124* 108* 111* 93  BUN 18 18 15 18   CREATININE 1.84* 1.64* 1.44* 1.50*  CALCIUM 9.3 8.7* 8.8* 8.7*  MG  --  2.3 2.5* 2.4  PHOS  --   --  4.3 4.7*    GFR: Estimated Creatinine Clearance: 46.1 mL/min ([name]) (by C-G formula based on SCr of 1.5 mg/dL (H)).  Liver Function Tests: Recent Labs  Lab 06/28/21 2002 06/29/21 0447 06/30/21 0512 07/01/21 0534  AST 23 25 20 16   ALT 17 16 13 12   ALKPHOS 150* 128* 112 92  BILITOT 0.7 0.7 0.6 0.6  PROT 8.3* 7.8 7.3 6.6  ALBUMIN 4.3 3.8 3.6 3.2*    CBG: No results for input(s): GLUCAP in the last 168  hours.   Recent Results (from the past 240 hour(s))  Resp Panel by RT-PCR (Flu [name]&B, Covid) Nasopharyngeal Swab     Status: None   Collection Time: 06/28/21  8:03 PM   Specimen: Nasopharyngeal Swab; Nasopharyngeal(NP) swabs in vial transport medium  Result Value Ref Range Status   SARS Coronavirus 2 by RT PCR NEGATIVE NEGATIVE Final    Comment: (NOTE) SARS-CoV-2 target nucleic acids are NOT DETECTED.  The SARS-CoV-2 RNA is generally detectable in upper respiratory specimens during the acute phase of infection. The lowest concentration of SARS-CoV-2 viral copies this assay can detect is 138 copies/mL. Caridad Silveira negative result does not preclude SARS-Cov-2 infection and should not be used as the sole basis  for treatment or other patient management decisions. Aynslee Mulhall negative result may occur with  improper specimen collection/handling, submission of specimen other than nasopharyngeal swab, presence of viral mutation(s) within the areas targeted by this assay, and inadequate number of viral copies(<138 copies/mL). Johnta Couts negative result must be combined with clinical observations, patient history, and epidemiological information. The expected result is Negative.  Fact Sheet for Patients:  EntrepreneurPulse.com.au  Fact Sheet for Healthcare Providers:  IncredibleEmployment.be  This test is no t yet approved or cleared by the [place] FDA and  has been authorized for detection and/or diagnosis of SARS-CoV-2 by FDA under an Emergency Use Authorization (EUA). This EUA will remain  in effect (meaning this test can be used) for the duration of the COVID-19 declaration under Section 564(b)(1) of the Act, 21 U.S.C.section 360bbb-3(b)(1), unless the authorization is terminated  or revoked sooner.       Influenza Metztli Sachdev by PCR NEGATIVE NEGATIVE Final   Influenza B by PCR NEGATIVE NEGATIVE Final    Comment: (NOTE) The Xpert Xpress SARS-CoV-2/FLU/RSV plus assay is intended  as an aid in the diagnosis of influenza from Nasopharyngeal swab specimens and should not be used as Elica Almas sole basis for treatment. Nasal washings and aspirates are unacceptable for Xpert Xpress SARS-CoV-2/FLU/RSV testing.  Fact Sheet for Patients: EntrepreneurPulse.com.au  Fact Sheet for Healthcare Providers: IncredibleEmployment.be  This test is not yet approved or cleared by the [place] FDA and has been authorized for detection and/or diagnosis of SARS-CoV-2 by FDA under an Emergency Use Authorization (EUA). This EUA will remain in effect (meaning this test can be used) for the duration of the COVID-19 declaration under Section 564(b)(1) of the Act, 21 U.S.C. section 360bbb-3(b)(1), unless the authorization is terminated or revoked.  Performed at [facility], [place] [address] [place], [place] [address]          Radiology Studies: DG Abd 1 View  Result Date: 07/01/2021 CLINICAL DATA:  Follow-up ileus EXAM: ABDOMEN - 1 VIEW COMPARISON:  June 30, 2021. FINDINGS: Nasogastric tube with tip and side port overlying the left upper quadrant. Left-sided abdominal surgical clips. Similar gaseous dilation large bowel with the transverse colon measuring up to 9.5 cm and no gas in the rectal vault. IMPRESSION: Similar gaseous dilation of large bowel with no gas in the rectal wall. Stable nasogastric tube Electronically Signed   By: Dahlia Bailiff M.D.   On: 07/01/2021 08:25   DG Abd 1 View  Result Date: 06/30/2021 CLINICAL DATA:  Encounter for abdominal distention. EXAM: ABDOMEN - 1 VIEW COMPARISON:  KUB 04/20/2021 CT abdomen and pelvis 06/28/2021 FINDINGS: There is moderate stool within the rectum. Interval clearance of the stool previously seen within the descending colon on 04/20/2021 KUB. There is moderate distention of the ascending and transverse colon measuring up to approximately 10 cm in caliber, mildly decreased from 11 cm  on 04/20/2021. On prior CT there appeared to be postsurgical change of left hemicolectomy. There is again mild distention of loops of bowel within the left hemiabdomen, measuring up to approximately 4.3 cm. No portal venous gas or pneumatosis, noting the superior aspect of the liver is not imaged. No definite free air is seen. No acute skeletal abnormality. IMPRESSION: Similar to recent 06/28/2021 CT, air is seen within the ascending and transverse colon and mildly distended loops of small bowel within the left hemiabdomen. This may be secondary to ileus. Electronically Signed   By: Yvonne Kendall M.D.   On: 06/30/2021 11:24  DG Abd Portable 1V  Result Date: 06/30/2021 CLINICAL DATA:  NG tube placement. EXAM: PORTABLE ABDOMEN - 1 VIEW COMPARISON:  Abdominal x-ray 06/30/2021. FINDINGS: The tip of the nasogastric tube is in the mid/proximal stomach. Dilatation of the colon has not significantly changed. There surgical clips in the left abdomen. Lung bases are grossly clear. No acute fractures. IMPRESSION: 1. Nasogastric tube tip in the mid/proximal stomach. 2. Stable dilated colon. Electronically Signed   By: Ronney Asters M.D.   On: 06/30/2021 18:26   ECHOCARDIOGRAM COMPLETE  Result Date: 06/29/2021    ECHOCARDIOGRAM REPORT   Patient Name:   OVERTON BOGGUS Date of Exam: 06/29/2021 Medical Rec #:  [id number]    Height:       66.0 in Accession #:    [id number]   Weight:       141.0 lb Date of Birth:  01/12/1961   BSA:          1.724 m Patient Age:    58 years     BP:           137/117 mmHg Patient Gender: M            HR:           85 bpm. Exam Location:  Inpatient Procedure: 2D Echo Indications:    Cardiomegaly  History:        Patient has no prior history of Echocardiogram examinations.                 Risk Factors:Hypertension.  Sonographer:    Arlyss Gandy Referring Phys: [id number] TIMOTHY S OPYD  Sonographer Comments: PLAX measurements at end of study due to patient condition. IMPRESSIONS  1. Left ventricular  ejection fraction, by estimation, is 70 to 75%. The left ventricle has hyperdynamic function. The left ventricle has no regional wall motion abnormalities. There is mild left ventricular hypertrophy of the basal-septal segment. Left ventricular diastolic parameters were normal.  2. Right ventricular systolic function is normal. The right ventricular size is normal.  3. The mitral valve is normal in structure. Trivial mitral valve regurgitation. No evidence of mitral stenosis.  4. The aortic valve is normal in structure. Aortic valve regurgitation is not visualized. No aortic stenosis is present. Aortic valve area, by VTI measures 2.84 cm. Aortic valve mean gradient measures 6.0 mmHg. Aortic valve Vmax measures 1.62 m/s. FINDINGS  Left Ventricle: Left ventricular ejection fraction, by estimation, is 70 to 75%. The left ventricle has hyperdynamic function. The left ventricle has no regional wall motion abnormalities. The left ventricular internal cavity size was normal in size. There is mild left ventricular hypertrophy of the basal-septal segment. Left ventricular diastolic parameters were normal. Normal left ventricular filling pressure. Right Ventricle: The right ventricular size is normal. No increase in right ventricular wall thickness. Right ventricular systolic function is normal. Left Atrium: Left atrial size was normal in size. Right Atrium: Right atrial size was normal in size. Pericardium: There is no evidence of pericardial effusion. Mitral Valve: The mitral valve is normal in structure. Trivial mitral valve regurgitation. No evidence of mitral valve stenosis. Tricuspid Valve: The tricuspid valve is normal in structure. Tricuspid valve regurgitation is trivial. No evidence of tricuspid stenosis. Aortic Valve: The aortic valve is normal in structure. Aortic valve regurgitation is not visualized. No aortic stenosis is present. Aortic valve mean gradient measures 6.0 mmHg. Aortic valve peak gradient measures  10.5 mmHg. Aortic valve area, by VTI measures 2.84 cm. Pulmonic Valve: The pulmonic valve  was normal in structure. Pulmonic valve regurgitation is trivial. No evidence of pulmonic stenosis. Aorta: The aortic root is normal in size and structure. Venous: The inferior vena cava was not well visualized. IAS/Shunts: No atrial level shunt detected by color flow Doppler.  LEFT VENTRICLE PLAX 2D LVIDd:         3.40 cm   Diastology LVIDs:         2.10 cm   LV e' medial:    9.36 cm/s LV PW:         1.10 cm   LV E/e' medial:  7.9 LV IVS:        1.20 cm   LV e' lateral:   8.92 cm/s LVOT diam:     2.00 cm   LV E/e' lateral: 8.3 LV SV:         79 LV SV Index:   46 LVOT Area:     3.14 cm  RIGHT VENTRICLE RV Basal diam:  3.40 cm RV Mid diam:    3.50 cm RV S prime:     17.00 cm/s TAPSE (M-mode): 2.5 cm LEFT ATRIUM             Index        RIGHT ATRIUM           Index LA diam:        3.50 cm 2.03 cm/m   RA Area:     13.60 cm LA Vol (A2C):   56.7 ml 32.89 ml/m  RA Volume:   32.10 ml  18.62 ml/m LA Vol (A4C):   31.2 ml 18.10 ml/m LA Biplane Vol: 42.9 ml 24.89 ml/m  AORTIC VALVE AV Area (Vmax):    2.79 cm AV Area (Vmean):   2.62 cm AV Area (VTI):     2.84 cm AV Vmax:           162.00 cm/s AV Vmean:          113.000 cm/s AV VTI:            0.280 m AV Peak Grad:      10.5 mmHg AV Mean Grad:      6.0 mmHg LVOT Vmax:         144.00 cm/s LVOT Vmean:        94.300 cm/s LVOT VTI:          0.253 m LVOT/AV VTI ratio: 0.90  AORTA Ao Root diam: 2.80 cm Ao Asc diam:  3.20 cm MITRAL VALVE               TRICUSPID VALVE MV Area (PHT): 4.10 cm    TR Peak grad:   24.6 mmHg MV Decel Time: 185 msec    TR Vmax:        248.00 cm/s MV E velocity: 73.80 cm/s MV Rhilynn Preyer velocity: 81.00 cm/s  SHUNTS MV E/Rhyen Mazariego ratio:  0.91        Systemic VTI:  0.25 m                            Systemic Diam: 2.00 cm Fransico Him MD Electronically signed by Fransico Him MD Signature Date/Time: 06/29/2021/3:12:16 PM    Final         Scheduled Meds:  chlorhexidine  15 mL  Mouth Rinse BID   haloperidol  20 mg Oral TID   lithium carbonate  150 mg Oral Daily   lithium carbonate  300 mg Oral QHS  mouth rinse  15 mL Mouth Rinse q12n4p   pantoprazole (PROTONIX) IV  40 mg Intravenous Q12H   polyethylene glycol  17 g Oral TID   propranolol  10 mg Oral TID   QUEtiapine  400 mg Oral BID   senna-docusate  2 tablet Oral BID   sodium chloride flush  3 mL Intravenous Q12H   zolpidem  10 mg Oral QHS   Continuous Infusions:  azithromycin 500 mg (06/30/21 2316)   cefTRIAXone (ROCEPHIN)  IV 2 g (06/30/21 2234)   lactated ringers with kcl 75 mL/hr at 07/01/21 1349   valproate sodium 500 mg (07/01/21 1008)     LOS: 3 days    Time spent: over 30 min    Fayrene Helper, MD [facility]   To contact the attending provider between 7A-7P or the covering provider during after hours 7P-7A, please log into the web site [email or web address] and access using universal [place] password for that web site. If you do not have the password, please call the hospital operator.  07/01/2021, 2:34 PM

## 2021-07-02 ENCOUNTER — Inpatient Hospital Stay (HOSPITAL_COMMUNITY): Payer: Medicaid Other

## 2021-07-02 LAB — COMPREHENSIVE METABOLIC PANEL
ALT: 11 U/L (ref 0–44)
AST: 17 U/L (ref 15–41)
Albumin: 3.2 g/dL — ABNORMAL LOW (ref 3.5–5.0)
Alkaline Phosphatase: 89 U/L (ref 38–126)
Anion gap: 10 (ref 5–15)
BUN: 18 mg/dL (ref 8–23)
CO2: 23 mmol/L (ref 22–32)
Calcium: 8.6 mg/dL — ABNORMAL LOW (ref 8.9–10.3)
Chloride: 107 mmol/L (ref 98–111)
Creatinine, Ser: 1.46 mg/dL — ABNORMAL HIGH (ref 0.61–1.24)
GFR, Estimated: 54 mL/min — ABNORMAL LOW (ref >60)
Glucose, Bld: 86 mg/dL (ref 70–99)
Potassium: 4.1 mmol/L (ref 3.5–5.1)
Sodium: 140 mmol/L (ref 135–145)
Total Bilirubin: 0.5 mg/dL (ref 0.3–1.2)
Total Protein: 6.5 g/dL (ref 6.5–8.1)

## 2021-07-02 LAB — CBC WITH DIFFERENTIAL/PLATELET
Abs Immature Granulocytes: 0.03 10*3/uL (ref 0.00–0.07)
Basophils Absolute: 0 10*3/uL (ref 0.0–0.1)
Basophils Relative: 0 %
Eosinophils Absolute: 0.2 10*3/uL (ref 0.0–0.5)
Eosinophils Relative: 2 %
HCT: 37.5 % — ABNORMAL LOW (ref 39.0–52.0)
Hemoglobin: 12 g/dL — ABNORMAL LOW (ref 13.0–17.0)
Immature Granulocytes: 1 %
Lymphocytes Relative: 21 %
Lymphs Abs: 1.4 10*3/uL (ref 0.7–4.0)
MCH: 29.9 pg (ref 26.0–34.0)
MCHC: 32 g/dL (ref 30.0–36.0)
MCV: 93.3 fL (ref 80.0–100.0)
Monocytes Absolute: 1 10*3/uL (ref 0.1–1.0)
Monocytes Relative: 15 %
Neutro Abs: 4 10*3/uL (ref 1.7–7.7)
Neutrophils Relative %: 61 %
Platelets: 175 10*3/uL (ref 150–400)
RBC: 4.02 MIL/uL — ABNORMAL LOW (ref 4.22–5.81)
RDW: 15.6 % — ABNORMAL HIGH (ref 11.5–15.5)
WBC: 6.7 10*3/uL (ref 4.0–10.5)
nRBC: 0 % (ref 0.0–0.2)

## 2021-07-02 LAB — GLUCOSE, CAPILLARY: Glucose-Capillary: 90 mg/dL (ref 70–99)

## 2021-07-02 LAB — PHOSPHORUS: Phosphorus: 4.2 mg/dL (ref 2.5–4.6)

## 2021-07-02 LAB — MAGNESIUM: Magnesium: 2.3 mg/dL (ref 1.7–2.4)

## 2021-07-02 MED ORDER — PANTOPRAZOLE SODIUM 40 MG IV SOLR
40.0000 mg | INTRAVENOUS | Status: DC
  Administered 2021-07-02 – 2021-07-05 (×4): 40 mg via INTRAVENOUS
  Filled 2021-07-02 (×4): qty 40

## 2021-07-02 NOTE — Progress Notes (Signed)
PROGRESS NOTE    Nicholas Oconnell  ZOX:[id number] DOB: May 05, 1961 DOA: 06/28/2021 PCP: Neale Burly, MD  Chief Complaint  Patient presents with   Altered Mental Status   Fatigue   Hemoptysis    Brief Narrative:  Nicholas Oconnell is Nicholas Oconnell pleasant 61 y.o. male with medical history significant for schizoaffective disorder, CKD stage III, hypertension, Ogilvie syndrome, history of partial colectomy, now presenting to the emergency department with lethargy and hematemesis.  The patient is reported to be alert and interactive at his baseline but for the past 2 days has been lethargic and not speaking.  He was also noted to have an episode of vomiting with blood today, prompting presentation to the ED.   ED Course: Upon arrival to the ED, patient is found to be afebrile, saturating well on room air, and with stable blood pressure.  EKG features sinus rhythm with LVH.  Chest x-ray notable for interval enlargement of the cardiac silhouette as well as hazy Structure in the left upper quadrant and possible air distention of the esophagus.  CT head is negative for acute intracranial abnormality.  CT of the abdomen and pelvis demonstrates diffusely fluid-filled bowel with occasional fluid distention but no transition point.  Also noted on CT is nodular airspace disease in the right lower lobe concerning for pneumonia.  Chemistry panel features Nicholas Oconnell creatinine 1.84 and CBC notable for leukocytosis 12,800.  Gastroenterology was consulted by the ED physician and the patient was given IV fluids and IV PPI in the ED.  Patient strongly resisted attempt to place NG tube in the ED.    Assessment & Plan:   Principal Problem:   Acute pseudo-obstruction of bowel Active Problems:   GI bleeding   Pneumonia   Acute encephalopathy   Cardiomegaly   CKD (chronic kidney disease), stage III (HCC)   Schizoaffective disorder, bipolar type (HCC)   * Acute pseudo-obstruction of bowel- (present on admission) Continue  lactulose 20 g TID prn, miralax TID for now per GI, senna BID Continue K>4 and mag>2 KUB with gaseous dilation of large bowel  NGT to LIS, needs restraints due to impulsivity, will pull out tube NPO except meds Appreciate GI assistance   GI bleeding- (present on admission) CT abd/pelvis fluid filled bowel, findings concerning for enterocolitis Episode of hematemesis Appreciate GI c/s, recommending daily PPI Maintain potassium >4 and magnesium >2  Pneumonia- (present on admission) Nodular airspace dz in RLL measuring 19 mm Likely pneumonia Recommending CT follow up  Continue ceftriaxone/azithromycin  Acute encephalopathy- (present on admission) Lethargy is improved Cognitive impairment and psychiatric illness at baseline Ammonia mildly elevated, will repeat TSH, b12, RPR wnl Lithium level WNL Valproic acid level wnl Home seroquel, depakote,lithium  Cardiomegaly- (present on admission) Noted on CXR Echo with EF 70-75%, no RWMA, mild LVH   CKD (chronic kidney disease), stage III ([place])- (present on admission) Creatinine appears close to baseline  Schizoaffective disorder, bipolar type ([place])- (present on admission) seroquel Nightly ambien   DVT prophylaxis: SCD Code Status: full Family Communication: none Disposition:   Status is: Inpatient  Remains inpatient appropriate because: need for IV abx, advancing diet       Consultants:  GI  Procedures:  none  Antimicrobials:  Anti-infectives (From admission, onward)    Start     Dose/Rate Route Frequency Ordered Stop   06/28/21 2245  cefTRIAXone (ROCEPHIN) 2 g in sodium chloride 0.9 % 100 mL IVPB        2 g 200 mL/hr over 30 Minutes  Intravenous Every 24 hours 06/28/21 2238 07/03/21 2244   06/28/21 2245  azithromycin (ZITHROMAX) 500 mg in sodium chloride 0.9 % 250 mL IVPB        500 mg 250 mL/hr over 60 Minutes Intravenous Every 24 hours 06/28/21 2238 07/03/21 2244       Subjective: Good spirits,  pleasant  Objective: Vitals:   07/02/21 0340 07/02/21 0500 07/02/21 1439 07/02/21 1441  BP: (!) 136/106  (!) 157/95   Pulse: 80  87   Resp: 16  19   Temp: 97.7 F (36.5 C)  100 F (37.8 C) 98.9 F (37.2 C)  TempSrc: Oral  Oral Oral  SpO2: 96%  96%   Weight:  73.4 kg      Intake/Output Summary (Last 24 hours) at 07/02/2021 1607 Last data filed at 07/02/2021 8588 Gross per 24 hour  Intake 1183.6 ml  Output 1650 ml  Net -466.4 ml   Filed Weights   06/30/21 0443 07/01/21 0500 07/02/21 0500  Weight: 73 kg 72.8 kg 73.4 kg    Examination:  General: No acute distress. Cardiovascular: RRR Lungs: unlabored Abdomen: abdominal distension, nontender Neurological: Alert and oriented 3. Moves all extremities 4 . Cranial nerves II through XII grossly intact. Skin: Warm and dry. No rashes or lesions. Extremities: No clubbing or cyanosis. No edema.     Data Reviewed: I have personally reviewed following labs and imaging studies  CBC: Recent Labs  Lab 06/28/21 2002 06/29/21 0002 06/29/21 0447 06/29/21 1712 06/30/21 0512 07/01/21 0534 07/02/21 0525  WBC 12.8*  --   --   --  10.6* 7.2 6.7  NEUTROABS 10.8*  --   --   --  7.7 4.7 4.0  HGB 14.0   < > 13.7 12.4* 12.6* 12.3* 12.0*  HCT 43.4   < > 41.9 39.2 39.9 38.6* 37.5*  MCV 91.9  --   --   --  93.4 92.3 93.3  PLT 250  --   --   --  205 176 175   < > = values in this interval not displayed.    Basic Metabolic Panel: Recent Labs  Lab 06/28/21 2002 06/29/21 0447 06/30/21 0512 07/01/21 0534 07/02/21 0525  NA 139 141 141 141 140  K 4.6 4.8 4.2 3.9 4.1  CL 107 106 104 106 107  CO2 21* 24 27 25 23   GLUCOSE 124* 108* 111* 93 86  BUN 18 18 15 18 18   CREATININE 1.84* 1.64* 1.44* 1.50* 1.46*  CALCIUM 9.3 8.7* 8.8* 8.7* 8.6*  MG  --  2.3 2.5* 2.4 2.3  PHOS  --   --  4.3 4.7* 4.2    GFR: Estimated Creatinine Clearance: 47.3 mL/min (Nicholas Oconnell) (by C-G formula based on SCr of 1.46 mg/dL (H)).  Liver Function Tests: Recent  Labs  Lab 06/28/21 2002 06/29/21 0447 06/30/21 0512 07/01/21 0534 07/02/21 0525  AST 23 25 20 16 17   ALT 17 16 13 12 11   ALKPHOS 150* 128* 112 92 89  BILITOT 0.7 0.7 0.6 0.6 0.5  PROT 8.3* 7.8 7.3 6.6 6.5  ALBUMIN 4.3 3.8 3.6 3.2* 3.2*    CBG: No results for input(s): GLUCAP in the last 168 hours.   Recent Results (from the past 240 hour(s))  Resp Panel by RT-PCR (Flu Nicholas Oconnell&B, Covid) Nasopharyngeal Swab     Status: None   Collection Time: 06/28/21  8:03 PM   Specimen: Nasopharyngeal Swab; Nasopharyngeal(NP) swabs in vial transport medium  Result Value Ref Range Status  SARS Coronavirus 2 by RT PCR NEGATIVE NEGATIVE Final    Comment: (NOTE) SARS-CoV-2 target nucleic acids are NOT DETECTED.  The SARS-CoV-2 RNA is generally detectable in upper respiratory specimens during the acute phase of infection. The lowest concentration of SARS-CoV-2 viral copies this assay can detect is 138 copies/mL. Nicholas Oconnell negative result does not preclude SARS-Cov-2 infection and should not be used as the sole basis for treatment or other patient management decisions. Nicholas Oconnell negative result may occur with  improper specimen collection/handling, submission of specimen other than nasopharyngeal swab, presence of viral mutation(s) within the areas targeted by this assay, and inadequate number of viral copies(<138 copies/mL). Nicholas Oconnell negative result must be combined with clinical observations, patient history, and epidemiological information. The expected result is Negative.  Fact Sheet for Patients:  EntrepreneurPulse.com.au  Fact Sheet for Healthcare Providers:  IncredibleEmployment.be  This test is no t yet approved or cleared by the [place] FDA and  has been authorized for detection and/or diagnosis of SARS-CoV-2 by FDA under an Emergency Use Authorization (EUA). This EUA will remain  in effect (meaning this test can be used) for the duration of the COVID-19 declaration  under Section 564(b)(1) of the Act, 21 U.S.C.section 360bbb-3(b)(1), unless the authorization is terminated  or revoked sooner.       Influenza Nicholas Oconnell by PCR NEGATIVE NEGATIVE Final   Influenza B by PCR NEGATIVE NEGATIVE Final    Comment: (NOTE) The Xpert Xpress SARS-CoV-2/FLU/RSV plus assay is intended as an aid in the diagnosis of influenza from Nasopharyngeal swab specimens and should not be used as Nicholas Oconnell sole basis for treatment. Nasal washings and aspirates are unacceptable for Xpert Xpress SARS-CoV-2/FLU/RSV testing.  Fact Sheet for Patients: EntrepreneurPulse.com.au  Fact Sheet for Healthcare Providers: IncredibleEmployment.be  This test is not yet approved or cleared by the [place] FDA and has been authorized for detection and/or diagnosis of SARS-CoV-2 by FDA under an Emergency Use Authorization (EUA). This EUA will remain in effect (meaning this test can be used) for the duration of the COVID-19 declaration under Section 564(b)(1) of the Act, 21 U.S.C. section 360bbb-3(b)(1), unless the authorization is terminated or revoked.  Performed at [facility], [place] [address] [place], [place] [address]          Radiology Studies: DG Abd 1 View  Result Date: 07/02/2021 CLINICAL DATA:  Adynamic ileus EXAM: ABDOMEN - 1 VIEW COMPARISON:  Multiple priors including most recent radiograph June 21, 2021. FINDINGS: Nasogastric tube with tip and side port overlying the left upper quadrant. Left abdominal surgical clips. Increased gaseous dilation of large bowel with the colonic loop of bowel projecting over the mid abdomen now measuring 12 cm previously 9.5 cm. No gas in the rectal vault. Pelvic phleboliths. IMPRESSION: 1. Increased gaseous dilation of large bowel consistent with worsening ileus/obstruction. 2. Stable nasogastric tube. Electronically Signed   By: Dahlia Bailiff M.D.   On: 07/02/2021 09:46   DG Abd 1  View  Result Date: 07/01/2021 CLINICAL DATA:  Follow-up ileus EXAM: ABDOMEN - 1 VIEW COMPARISON:  June 30, 2021. FINDINGS: Nasogastric tube with tip and side port overlying the left upper quadrant. Left-sided abdominal surgical clips. Similar gaseous dilation large bowel with the transverse colon measuring up to 9.5 cm and no gas in the rectal vault. IMPRESSION: Similar gaseous dilation of large bowel with no gas in the rectal wall. Stable nasogastric tube Electronically Signed   By: Dahlia Bailiff M.D.   On: 07/01/2021 08:25   DG Abd  Portable 1V  Result Date: 06/30/2021 CLINICAL DATA:  NG tube placement. EXAM: PORTABLE ABDOMEN - 1 VIEW COMPARISON:  Abdominal x-ray 06/30/2021. FINDINGS: The tip of the nasogastric tube is in the mid/proximal stomach. Dilatation of the colon has not significantly changed. There surgical clips in the left abdomen. Lung bases are grossly clear. No acute fractures. IMPRESSION: 1. Nasogastric tube tip in the mid/proximal stomach. 2. Stable dilated colon. Electronically Signed   By: Ronney Asters M.D.   On: 06/30/2021 18:26        Scheduled Meds:  chlorhexidine  15 mL Mouth Rinse BID   haloperidol  20 mg Oral TID   lithium carbonate  150 mg Oral Daily   lithium carbonate  300 mg Oral QHS   mouth rinse  15 mL Mouth Rinse q12n4p   pantoprazole (PROTONIX) IV  40 mg Intravenous Q24H   polyethylene glycol  17 g Oral TID   propranolol  10 mg Oral TID   QUEtiapine  400 mg Oral BID   senna-docusate  2 tablet Oral BID   sodium chloride flush  3 mL Intravenous Q12H   zolpidem  10 mg Oral QHS   Continuous Infusions:  azithromycin 500 mg (07/01/21 2204)   cefTRIAXone (ROCEPHIN)  IV 2 g (07/01/21 2123)   lactated ringers with kcl 75 mL/hr at 07/02/21 0256   valproate sodium 500 mg (07/02/21 1001)     LOS: 4 days    Time spent: over 30 min    Fayrene Helper, MD [facility]   To contact the attending provider between 7A-7P or the covering provider  during after hours 7P-7A, please log into the web site [email or web address] and access using universal [place] password for that web site. If you do not have the password, please call the hospital operator.  07/02/2021, 4:07 PM

## 2021-07-02 NOTE — Evaluation (Signed)
Physical Therapy Evaluation Patient Details Name: Nicholas Oconnell MRN: [id number] DOB: Feb 12, 1961 Today's Date: 07/02/2021  History of Present Illness  Pt admitted from group home 2* lethargy, AMS, and comitting and dx with AMS, PNA, GIB, and acute pseudo-obstruction of bowel.  Pt with hx of bipolar, seizure, CKD, cardiomegaly, schizoaffective disorder, and Ogilvie syndrome  Clinical Impression  Pt admitted as above and presenting with functional mobility limitations 2* balance deficits and questionable safety awareness.  Pt should progress to dc back to prior group home with 24/7 assist.     Recommendations for follow up therapy are one component of a multi-disciplinary discharge planning process, led by the attending physician.  Recommendations may be updated based on patient status, additional functional criteria and insurance authorization.  Follow Up Recommendations Home health PT (dependent on progress toward baseline in acute stay)    Assistance Recommended at Discharge Frequent or constant Supervision/Assistance  Patient can return home with the following  A little help with walking and/or transfers;A little help with bathing/dressing/bathroom;Assistance with cooking/housework;Assist for transportation;Help with stairs or ramp for entrance    Equipment Recommendations None recommended by PT  Recommendations for Other Services       Functional Status Assessment Patient has had a recent decline in their functional status and demonstrates the ability to make significant improvements in function in a reasonable and predictable amount of time.     Precautions / Restrictions Precautions Precautions: Fall Restrictions Weight Bearing Restrictions: No      Mobility  Bed Mobility Overal bed mobility: Needs Assistance Bed Mobility: Supine to Sit, Sit to Supine     Supine to sit: Min assist Sit to supine: Min assist   General bed mobility comments: multimodal cues for pt follow  through with min assist to manage LEs and control trunk    Transfers Overall transfer level: Needs assistance Equipment used: Rolling walker (2 wheels) Transfers: Sit to/from Stand Sit to Stand: Min assist, +2 physical assistance, +2 safety/equipment, From elevated surface           General transfer comment: cues for use of UEs; physical assist to bring wt up and fwd and to balance in standing with RW    Ambulation/Gait Ambulation/Gait assistance: Min assist, +2 physical assistance, +2 safety/equipment Gait Distance (Feet): 400 Feet Assistive device: Rolling walker (2 wheels) Gait Pattern/deviations: Step-to pattern, Step-through pattern, Decreased step length - right, Decreased step length - left, Shuffle, Trunk flexed, Narrow base of support       General Gait Details: frequent cues for posture and position from RW;  Physical assist to manage RW and for balance  Stairs            Wheelchair Mobility    Modified Rankin (Stroke Patients Only)       Balance Overall balance assessment: Needs assistance Sitting-balance support: No upper extremity supported, Feet supported Sitting balance-Leahy Scale: Good     Standing balance support: Bilateral upper extremity supported Standing balance-Leahy Scale: Poor                               Pertinent Vitals/Pain Pain Assessment Pain Assessment: No/denies pain    Home Living Family/patient expects to be discharged to:: Group home                   Additional Comments: Pt unable to provide reliable information regarding group home.    Prior Function Prior Level of Function : Patient  poor historian/Family not available             Mobility Comments: Pt stated he walks with walker but then states he dances in the bathroom       Hand Dominance        Extremity/Trunk Assessment   Upper Extremity Assessment Upper Extremity Assessment: Overall WFL for tasks assessed    Lower Extremity  Assessment Lower Extremity Assessment: Overall WFL for tasks assessed       Communication   Communication: No difficulties  Cognition Arousal/Alertness: Awake/alert Behavior During Therapy: Impulsive Overall Cognitive Status: History of cognitive impairments - at baseline                                          General Comments      Exercises     Assessment/Plan    PT Assessment Patient needs continued PT services  PT Problem List Decreased balance;Decreased mobility;Decreased cognition       PT Treatment Interventions DME instruction;Stair training;Gait training;Functional mobility training;Therapeutic activities;Therapeutic exercise;Balance training;Patient/family education;Cognitive remediation    PT Goals (Current goals can be found in the Care Plan section)  Acute Rehab PT Goals Patient Stated Goal: get out of bed PT Goal Formulation: Patient unable to participate in goal setting Time For Goal Achievement: 07/16/21 Potential to Achieve Goals: Good    Frequency Min 3X/week     Co-evaluation               AM-PAC PT "6 Clicks" Mobility  Outcome Measure Help needed turning from your back to your side while in a flat bed without using bedrails?: A Little Help needed moving from lying on your back to sitting on the side of a flat bed without using bedrails?: A Little Help needed moving to and from a bed to a chair (including a wheelchair)?: A Little Help needed standing up from a chair using your arms (e.g., wheelchair or bedside chair)?: A Little Help needed to walk in hospital room?: A Little Help needed climbing 3-5 steps with a railing? : A Lot 6 Click Score: 17    End of Session Equipment Utilized During Treatment: Gait belt Activity Tolerance: Patient tolerated treatment well Patient left: in bed;with call bell/phone within reach;with nursing/sitter in room Nurse Communication: Mobility status PT Visit Diagnosis: Unsteadiness on  feet (R26.81);Difficulty in walking, not elsewhere classified (R26.2)    Time: 0383-3383 PT Time Calculation (min) (ACUTE ONLY): 26 min   Charges:   PT Evaluation $PT Eval Low Complexity: 1 Low PT Treatments $Gait Training: 8-22 mins        Debe Coder PT Acute Rehabilitation Services Pager [phone] Office [phone]   Filomena Pokorney 07/02/2021, 12:41 PM

## 2021-07-02 NOTE — Progress Notes (Addendum)
Subjective: Patient states he feels pregnant. As per his nurse he has not had a bowel movement. There is minimal output via NG tube currently. 1300 mL NG tube output in the last 24 hours.  Objective: Vital signs in last 24 hours: Temp:  [97.7 F (36.5 C)-98.2 F (36.8 C)] 97.7 F (36.5 C) (01/29 0340) Pulse Rate:  [79-88] 80 (01/29 0340) Resp:  [16-18] 16 (01/29 0340) BP: (136-149)/(90-110) 136/106 (01/29 0340) SpO2:  [96 %-98 %] 96 % (01/29 0340) Weight:  [73.4 kg] 73.4 kg (01/29 0500) Weight change: 0.6 kg Last BM Date: 06/30/21  PE: On restraints, NG tube draining minimally bilious clear fluid GENERAL: Not in distress, appears comfortable  ABDOMEN: Distended, prior surgical scars noted, tympanitic on percussion, rare high-pitched bowel sounds noted, nontender EXTREMITIES: No deformity  Lab Results: Results for orders placed or performed during the hospital encounter of 06/28/21 (from the past 48 hour(s))  CBC with Differential/Platelet     Status: Abnormal   Collection Time: 07/01/21  5:34 AM  Result Value Ref Range   WBC 7.2 4.0 - 10.5 K/uL   RBC 4.18 (L) 4.22 - 5.81 MIL/uL   Hemoglobin 12.3 (L) 13.0 - 17.0 g/dL   HCT 38.6 (L) 39.0 - 52.0 %   MCV 92.3 80.0 - 100.0 fL   MCH 29.4 26.0 - 34.0 pg   MCHC 31.9 30.0 - 36.0 g/dL   RDW 15.5 11.5 - 15.5 %   Platelets 176 150 - 400 K/uL   nRBC 0.0 0.0 - 0.2 %   Neutrophils Relative % 66 %   Neutro Abs 4.7 1.7 - 7.7 K/uL   Lymphocytes Relative 18 %   Lymphs Abs 1.3 0.7 - 4.0 K/uL   Monocytes Relative 15 %   Monocytes Absolute 1.1 (H) 0.1 - 1.0 K/uL   Eosinophils Relative 1 %   Eosinophils Absolute 0.1 0.0 - 0.5 K/uL   Basophils Relative 0 %   Basophils Absolute 0.0 0.0 - 0.1 K/uL   Immature Granulocytes 0 %   Abs Immature Granulocytes 0.03 0.00 - 0.07 K/uL    Comment: Performed at [facility], [place] [address], [place], [place] [address]  Comprehensive metabolic panel     Status: Abnormal   Collection  Time: 07/01/21  5:34 AM  Result Value Ref Range   Sodium 141 135 - 145 mmol/L   Potassium 3.9 3.5 - 5.1 mmol/L   Chloride 106 98 - 111 mmol/L   CO2 25 22 - 32 mmol/L   Glucose, Bld 93 70 - 99 mg/dL    Comment: Glucose reference range applies only to samples taken after fasting for at least 8 hours.   BUN 18 8 - 23 mg/dL   Creatinine, Ser 1.50 (H) 0.61 - 1.24 mg/dL   Calcium 8.7 (L) 8.9 - 10.3 mg/dL   Total Protein 6.6 6.5 - 8.1 g/dL   Albumin 3.2 (L) 3.5 - 5.0 g/dL   AST 16 15 - 41 U/L   ALT 12 0 - 44 U/L   Alkaline Phosphatase 92 38 - 126 U/L   Total Bilirubin 0.6 0.3 - 1.2 mg/dL   GFR, Estimated 52 (L) >60 mL/min    Comment: (NOTE) Calculated using the CKD-EPI Creatinine Equation (2021)    Anion gap 10 5 - 15    Comment: Performed at [facility], [place] [address], [place], [place] [address]  Magnesium     Status: None   Collection Time: 07/01/21  5:34 AM  Result  Value Ref Range   Magnesium 2.4 1.7 - 2.4 mg/dL    Comment: Performed at [facility], [place] [address] [place], [place] [address]  Phosphorus     Status: Abnormal   Collection Time: 07/01/21  5:34 AM  Result Value Ref Range   Phosphorus 4.7 (H) 2.5 - 4.6 mg/dL    Comment: Performed at [facility], [place] [address] [place], [place] [address]  CBC with Differential/Platelet     Status: Abnormal   Collection Time: 07/02/21  5:25 AM  Result Value Ref Range   WBC 6.7 4.0 - 10.5 K/uL   RBC 4.02 (L) 4.22 - 5.81 MIL/uL   Hemoglobin 12.0 (L) 13.0 - 17.0 g/dL   HCT 37.5 (L) 39.0 - 52.0 %   MCV 93.3 80.0 - 100.0 fL   MCH 29.9 26.0 - 34.0 pg   MCHC 32.0 30.0 - 36.0 g/dL   RDW 15.6 (H) 11.5 - 15.5 %   Platelets 175 150 - 400 K/uL   nRBC 0.0 0.0 - 0.2 %   Neutrophils Relative % 61 %   Neutro Abs 4.0 1.7 - 7.7 K/uL   Lymphocytes Relative 21 %   Lymphs Abs 1.4 0.7 - 4.0 K/uL   Monocytes Relative 15 %   Monocytes Absolute 1.0 0.1 - 1.0 K/uL   Eosinophils Relative 2 %    Eosinophils Absolute 0.2 0.0 - 0.5 K/uL   Basophils Relative 0 %   Basophils Absolute 0.0 0.0 - 0.1 K/uL   Immature Granulocytes 1 %   Abs Immature Granulocytes 0.03 0.00 - 0.07 K/uL    Comment: Performed at [facility], [place] [address] [place], [place] [address]  Comprehensive metabolic panel     Status: Abnormal   Collection Time: 07/02/21  5:25 AM  Result Value Ref Range   Sodium 140 135 - 145 mmol/L   Potassium 4.1 3.5 - 5.1 mmol/L   Chloride 107 98 - 111 mmol/L   CO2 23 22 - 32 mmol/L   Glucose, Bld 86 70 - 99 mg/dL    Comment: Glucose reference range applies only to samples taken after fasting for at least 8 hours.   BUN 18 8 - 23 mg/dL   Creatinine, Ser 1.46 (H) 0.61 - 1.24 mg/dL   Calcium 8.6 (L) 8.9 - 10.3 mg/dL   Total Protein 6.5 6.5 - 8.1 g/dL   Albumin 3.2 (L) 3.5 - 5.0 g/dL   AST 17 15 - 41 U/L   ALT 11 0 - 44 U/L   Alkaline Phosphatase 89 38 - 126 U/L   Total Bilirubin 0.5 0.3 - 1.2 mg/dL   GFR, Estimated 54 (L) >60 mL/min    Comment: (NOTE) Calculated using the CKD-EPI Creatinine Equation (2021)    Anion gap 10 5 - 15    Comment: Performed at [facility], [place] [address], [place], [place] [address]  Magnesium     Status: None   Collection Time: 07/02/21  5:25 AM  Result Value Ref Range   Magnesium 2.3 1.7 - 2.4 mg/dL    Comment: Performed at [facility], [place] [address], [place], [place] [address]  Phosphorus     Status: None   Collection Time: 07/02/21  5:25 AM  Result Value Ref Range   Phosphorus 4.2 2.5 - 4.6 mg/dL    Comment: Performed at [facility], [place] [address] [place], [place] [address]    Studies/Results: DG Abd 1 View  Result Date: 07/01/2021 CLINICAL DATA:  Follow-up ileus EXAM: ABDOMEN - 1 VIEW COMPARISON:  June 30, 2021. FINDINGS: Nasogastric tube with tip and side port overlying the left upper quadrant. Left-sided abdominal surgical clips. Similar gaseous  dilation large bowel with the transverse colon measuring up to 9.5 cm and no gas in the rectal vault. IMPRESSION: Similar gaseous dilation of large bowel with no gas in the rectal wall. Stable nasogastric tube Electronically Signed   By: Dahlia Bailiff M.D.   On: 07/01/2021 08:25   DG Abd 1 View  Result Date: 06/30/2021 CLINICAL DATA:  Encounter for abdominal distention. EXAM: ABDOMEN - 1 VIEW COMPARISON:  KUB 04/20/2021 CT abdomen and pelvis 06/28/2021 FINDINGS: There is moderate stool within the rectum. Interval clearance of the stool previously seen within the descending colon on 04/20/2021 KUB. There is moderate distention of the ascending and transverse colon measuring up to approximately 10 cm in caliber, mildly decreased from 11 cm on 04/20/2021. On prior CT there appeared to be postsurgical change of left hemicolectomy. There is again mild distention of loops of bowel within the left hemiabdomen, measuring up to approximately 4.3 cm. No portal venous gas or pneumatosis, noting the superior aspect of the liver is not imaged. No definite free air is seen. No acute skeletal abnormality. IMPRESSION: Similar to recent 06/28/2021 CT, air is seen within the ascending and transverse colon and mildly distended loops of small bowel within the left hemiabdomen. This may be secondary to ileus. Electronically Signed   By: Yvonne Kendall M.D.   On: 06/30/2021 11:24   DG Abd Portable 1V  Result Date: 06/30/2021 CLINICAL DATA:  NG tube placement. EXAM: PORTABLE ABDOMEN - 1 VIEW COMPARISON:  Abdominal x-ray 06/30/2021. FINDINGS: The tip of the nasogastric tube is in the mid/proximal stomach. Dilatation of the colon has not significantly changed. There surgical clips in the left abdomen. Lung bases are grossly clear. No acute fractures. IMPRESSION: 1. Nasogastric tube tip in the mid/proximal stomach. 2. Stable dilated colon. Electronically Signed   By: Ronney Asters M.D.   On: 06/30/2021 18:26    Medications: I have  reviewed the patient's current medications.  Assessment: Ileus/pseudoobstruction Abdominal x-ray today-results pending, persistent colonic dilation noted  Some medication such as Seroquel can worsen ileus due to anticholinergic effect  Potassium 4.1 Magnesium 2.3 Renal function stable, BUN 18, creatinine 1.46, GFR 54 No leukocytosis  Patient with 1 episode of hematemesis, hemoglobin stable at 12 with a normal BUN-on Protonix 40 mg twice a day  On azithromycin and ceftriaxone for suspected pneumonia  Plan: Difficult scenario, patient with schizoaffective disorder currently requiring restraints, possible ambulation today with physical therapy  History of Ogilvie's syndrome and partial colectomy Continued colonic dilation noted, despite keeping electrolytes-potassium and magnesium within normal limits and avoiding narcotics  Patient on MiraLAX 3 times a day and Senokot 2 tablets twice a day. Advised nurse to give patient lactulose 20 g 3 times a day today.  Decrease Protonix to once a day.  Ronnette Juniper, MD 07/02/2021, 9:05 AM

## 2021-07-02 NOTE — Progress Notes (Signed)
Please note that pt had very, very large black, runny BM this evening.

## 2021-07-03 ENCOUNTER — Inpatient Hospital Stay (HOSPITAL_COMMUNITY): Payer: Medicaid Other

## 2021-07-03 DIAGNOSIS — J690 Pneumonitis due to inhalation of food and vomit: Secondary | ICD-10-CM

## 2021-07-03 LAB — COMPREHENSIVE METABOLIC PANEL
ALT: 11 U/L (ref 0–44)
AST: 20 U/L (ref 15–41)
Albumin: 2.9 g/dL — ABNORMAL LOW (ref 3.5–5.0)
Alkaline Phosphatase: 85 U/L (ref 38–126)
Anion gap: 9 (ref 5–15)
BUN: 16 mg/dL (ref 8–23)
CO2: 25 mmol/L (ref 22–32)
Calcium: 8.7 mg/dL — ABNORMAL LOW (ref 8.9–10.3)
Chloride: 109 mmol/L (ref 98–111)
Creatinine, Ser: 1.51 mg/dL — ABNORMAL HIGH (ref 0.61–1.24)
GFR, Estimated: 52 mL/min — ABNORMAL LOW (ref >60)
Glucose, Bld: 78 mg/dL (ref 70–99)
Potassium: 4.6 mmol/L (ref 3.5–5.1)
Sodium: 143 mmol/L (ref 135–145)
Total Bilirubin: 0.6 mg/dL (ref 0.3–1.2)
Total Protein: 6.6 g/dL (ref 6.5–8.1)

## 2021-07-03 LAB — CBC WITH DIFFERENTIAL/PLATELET
Abs Immature Granulocytes: 0.06 10*3/uL (ref 0.00–0.07)
Basophils Absolute: 0 10*3/uL (ref 0.0–0.1)
Basophils Relative: 0 %
Eosinophils Absolute: 0.1 10*3/uL (ref 0.0–0.5)
Eosinophils Relative: 2 %
HCT: 36.3 % — ABNORMAL LOW (ref 39.0–52.0)
Hemoglobin: 11.9 g/dL — ABNORMAL LOW (ref 13.0–17.0)
Immature Granulocytes: 1 %
Lymphocytes Relative: 29 %
Lymphs Abs: 2 10*3/uL (ref 0.7–4.0)
MCH: 29.9 pg (ref 26.0–34.0)
MCHC: 32.8 g/dL (ref 30.0–36.0)
MCV: 91.2 fL (ref 80.0–100.0)
Monocytes Absolute: 1.1 10*3/uL — ABNORMAL HIGH (ref 0.1–1.0)
Monocytes Relative: 15 %
Neutro Abs: 3.6 10*3/uL (ref 1.7–7.7)
Neutrophils Relative %: 53 %
Platelets: UNDETERMINED 10*3/uL (ref 150–400)
RBC: 3.98 MIL/uL — ABNORMAL LOW (ref 4.22–5.81)
RDW: 15.3 % (ref 11.5–15.5)
WBC: 6.9 10*3/uL (ref 4.0–10.5)
nRBC: 0 % (ref 0.0–0.2)

## 2021-07-03 LAB — AMMONIA: Ammonia: 40 umol/L — ABNORMAL HIGH (ref 9–35)

## 2021-07-03 LAB — MAGNESIUM: Magnesium: 2.3 mg/dL (ref 1.7–2.4)

## 2021-07-03 LAB — PHOSPHORUS: Phosphorus: 4.9 mg/dL — ABNORMAL HIGH (ref 2.5–4.6)

## 2021-07-03 LAB — GLUCOSE, CAPILLARY: Glucose-Capillary: 71 mg/dL (ref 70–99)

## 2021-07-03 MED ORDER — DIVALPROEX SODIUM ER 500 MG PO TB24
1000.0000 mg | ORAL_TABLET | Freq: Every day | ORAL | Status: DC
  Administered 2021-07-04 – 2021-07-07 (×4): 1000 mg via ORAL
  Filled 2021-07-03 (×4): qty 2

## 2021-07-03 MED ORDER — DIVALPROEX SODIUM 250 MG PO DR TAB
500.0000 mg | DELAYED_RELEASE_TABLET | Freq: Once | ORAL | Status: AC
  Administered 2021-07-03: 500 mg via ORAL
  Filled 2021-07-03: qty 2

## 2021-07-03 NOTE — Progress Notes (Addendum)
[facility] Gastroenterology Progress Note  Nicholas Oconnell 61 y.o. 23-Jan-1961  CC:  Ileus/pseudo-obstruction   Subjective: Patient states he is feeling better today. Per RN report, patient has had 1 BM in the last 24 hrs. It was a large BM at 6 PM yesterday. 1150 mL NG tube output in the last 24 hrs.  ROS : Review of Systems  Unable to perform ROS: Psychiatric disorder     Objective: Vital signs in last 24 hours: Vitals:   07/03/21 0300 07/03/21 0509  BP:  (!) 168/93  Pulse:  77  Resp: 18 18  Temp:  98.9 F (37.2 C)  SpO2:  97%    Physical Exam:  General:  Alert, cooperative, no distress, appears stated age  Head:  Normocephalic, without obvious abnormality, atraumatic  Eyes:  Anicteric sclera, EOM's intact  Lungs:   Clear to auscultation bilaterally, respirations unlabored  Heart:  Regular rate and rhythm, S1, S2 normal  Abdomen:   Distended, non-tender, high-pitch bowel sounds active all four quadrants,  prior surgical scars noted.     Lab Results: Recent Labs    07/02/21 0525 07/03/21 0502  NA 140 143  K 4.1 4.6  CL 107 109  CO2 23 25  GLUCOSE 86 78  BUN 18 16  CREATININE 1.46* 1.51*  CALCIUM 8.6* 8.7*  MG 2.3 2.3  PHOS 4.2 4.9*   Recent Labs    07/02/21 0525 07/03/21 0502  AST 17 20  ALT 11 11  ALKPHOS 89 85  BILITOT 0.5 0.6  PROT 6.5 6.6  ALBUMIN 3.2* 2.9*   Recent Labs    07/02/21 0525 07/03/21 0502  WBC 6.7 6.9  NEUTROABS 4.0 3.6  HGB 12.0* 11.9*  HCT 37.5* 36.3*  MCV 93.3 91.2  PLT 175 PLATELET CLUMPS NOTED ON SMEAR, UNABLE TO ESTIMATE   No results for input(s): LABPROT, INR in the last 72 hours.    Assessment Ileus/pseudo-obstruction; history of Ogilvie's syndrome and partial colectomy - abdominal xray 1/29: persistent increased gaseous dilation of large bowel with worsening ileus/obstruction - potassium 4.6 - magnesium 2.3 - No leukocytosis - Stable renal function: BUN 16, Cr. 1.51  Plan: Continue to try to ambulate if  possible, though difficult situation with history of schizoaffective disorder and requiring restraints. Continue to maintain magnesium above 2 and potassium at 4-4.5.  Champ NG tube in place. Miralax 3 times daily and senokot 2 tablets BID Dulcolax suppository Continue NPO [name] will follow  Garnette Scheuermann PA-C 07/03/2021, 8:09 AM  Contact #  [phone]

## 2021-07-03 NOTE — Assessment & Plan Note (Addendum)
Patient completed antibiotics.

## 2021-07-03 NOTE — Progress Notes (Signed)
**Note De-Identified Nicholas Obfuscation** PROGRESS NOTE    Nicholas Oconnell  ERD:[id number] DOB: 24-Sep-1960 DOA: 06/28/2021 PCP: Neale Burly, MD  Chief Complaint  Patient presents with   Altered Mental Status   Fatigue   Hemoptysis    Brief Narrative:  Nicholas Oconnell is Nicholas Oconnell pleasant 61 y.o. male with medical history significant for schizoaffective disorder, CKD stage III, hypertension, Ogilvie syndrome, history of partial colectomy, now presenting to the emergency department with lethargy and hematemesis.  The patient is reported to be alert and interactive at his baseline but for the past 2 days has been lethargic and not speaking.  He was also noted to have an episode of vomiting with blood today, prompting presentation to the ED.   ED Course: Upon arrival to the ED, patient is found to be afebrile, saturating well on room air, and with stable blood pressure.  EKG features sinus rhythm with LVH.  Chest x-ray notable for interval enlargement of the cardiac silhouette as well as hazy Structure in the left upper quadrant and possible air distention of the esophagus.  CT head is negative for acute intracranial abnormality.  CT of the abdomen and pelvis demonstrates diffusely fluid-filled bowel with occasional fluid distention but no transition point.  Also noted on CT is nodular airspace disease in the right lower lobe concerning for pneumonia.  Chemistry panel features Nicholas Oconnell creatinine 1.84 and CBC notable for leukocytosis 12,800.  Gastroenterology was consulted by the ED physician and the patient was given IV fluids and IV PPI in the ED.  Patient strongly resisted attempt to place NG tube in the ED.    Assessment & Plan:   Principal Problem:   Acute pseudo-obstruction of bowel Active Problems:   GI bleeding   Pneumonia   Aspiration pneumonitis (HCC)   Acute encephalopathy   Cardiomegaly   CKD (chronic kidney disease), stage III (HCC)   Schizoaffective disorder, bipolar type (HCC)  Assessment and Plan: * Acute  pseudo-obstruction of bowel- (present on admission) miralax TID for now per GI, senna BID Continue K>4 and mag>2 KUB with increased gaseous dilation of large bowel  Pulled NG out Appreciate GI assistance -> will gradually advance diet, holding lactulose per GI   GI bleeding- (present on admission) CT abd/pelvis fluid filled bowel, findings concerning for enterocolitis Episode of hematemesis Appreciate GI c/s, recommending daily PPI Maintain potassium >4 and magnesium >2  Aspiration pneumonitis (HCC) Cough today CXR with streaky right lung base consolidation Afebrile (tmax 100) and normal WBC - will treat for now like aspiration pneumonitis SLP eval abx if fevers or rising white count  Pneumonia- (present on admission) Nodular airspace dz in RLL measuring 19 mm Likely pneumonia Recommending CT follow up  ceftriaxone/azithromycin (completed 5 days)  Acute encephalopathy- (present on admission) Lethargy is improved Cognitive impairment and psychiatric illness at baseline Ammonia mildly elevated, will repeat TSH, b12, RPR wnl Lithium level WNL Valproic acid level wnl Home seroquel, depakote,lithium  Cardiomegaly- (present on admission) Noted on CXR Echo with EF 70-75%, no RWMA, mild LVH   CKD (chronic kidney disease), stage III (HCC)- (present on admission) Creatinine appears close to baseline  Schizoaffective disorder, bipolar type ([place])- (present on admission) seroquel Nightly ambien   DVT prophylaxis: SCD Code Status: full Family Communication: none Disposition:   Status is: Inpatient  Remains inpatient appropriate because: need for IV abx, advancing diet       Consultants:  GI  Procedures:  none  Antimicrobials:  Anti-infectives (From admission, onward)    Start  Dose/Rate Route Frequency Ordered Stop   06/28/21 2245  cefTRIAXone (ROCEPHIN) 2 g in sodium chloride 0.9 % 100 mL IVPB        2 g 200 mL/hr over 30 Minutes Intravenous Every 24  hours 06/28/21 2238 07/02/21 2323   06/28/21 2245  azithromycin (ZITHROMAX) 500 mg in sodium chloride 0.9 % 250 mL IVPB        500 mg 250 mL/hr over 60 Minutes Intravenous Every 24 hours 06/28/21 2238 07/03/21 0034       Subjective: Good spirits again today No complaints  Objective: Vitals:   07/03/21 0500 07/03/21 0509 07/03/21 1325 07/03/21 2008  BP:  (!) 168/93 (!) 171/102 (!) 159/95  Pulse:  77 82 77  Resp:  18 18 18   Temp:  98.9 F (37.2 C) 98.1 F (36.7 C) 99 F (37.2 C)  TempSrc:  Oral Oral Oral  SpO2:  97% 100% 90%  Weight: 69.1 kg     Height:        Intake/Output Summary (Last 24 hours) at 07/03/2021 2013 Last data filed at 07/03/2021 1800 Gross per 24 hour  Intake 1388.38 ml  Output 2350 ml  Net -961.62 ml   Filed Weights   07/01/21 0500 07/02/21 0500 07/03/21 0500  Weight: 72.8 kg 73.4 kg 69.1 kg    Examination:  General: No acute distress. Cardiovascular: RRR Lungs: coarse breath sounds Abdomen: Soft, nontender, nondistended Neurological: conversant, confused, moving all extremities Skin: Warm and dry. No rashes or lesions. Extremities: No clubbing or cyanosis. No edema.      Data Reviewed: I have personally reviewed following labs and imaging studies  CBC: Recent Labs  Lab 06/28/21 2002 06/29/21 0002 06/29/21 1712 06/30/21 0512 07/01/21 0534 07/02/21 0525 07/03/21 0502  WBC 12.8*  --   --  10.6* 7.2 6.7 6.9  NEUTROABS 10.8*  --   --  7.7 4.7 4.0 3.6  HGB 14.0   < > 12.4* 12.6* 12.3* 12.0* 11.9*  HCT 43.4   < > 39.2 39.9 38.6* 37.5* 36.3*  MCV 91.9  --   --  93.4 92.3 93.3 91.2  PLT 250  --   --  205 176 175 PLATELET CLUMPS NOTED ON SMEAR, UNABLE TO ESTIMATE   < > = values in this interval not displayed.    Basic Metabolic Panel: Recent Labs  Lab 06/29/21 0447 06/30/21 0512 07/01/21 0534 07/02/21 0525 07/03/21 0502  NA 141 141 141 140 143  K 4.8 4.2 3.9 4.1 4.6  CL 106 104 106 107 109  CO2 24 27 25 23 25   GLUCOSE 108*  111* 93 86 78  BUN 18 15 18 18 16   CREATININE 1.64* 1.44* 1.50* 1.46* 1.51*  CALCIUM 8.7* 8.8* 8.7* 8.6* 8.7*  MG 2.3 2.5* 2.4 2.3 2.3  PHOS  --  4.3 4.7* 4.2 4.9*    GFR: Estimated Creatinine Clearance: 45.8 mL/min (Nicholas Oconnell) (by C-G formula based on SCr of 1.51 mg/dL (H)).  Liver Function Tests: Recent Labs  Lab 06/29/21 0447 06/30/21 0512 07/01/21 0534 07/02/21 0525 07/03/21 0502  AST 25 20 16 17 20   ALT 16 13 12 11 11   ALKPHOS 128* 112 92 89 85  BILITOT 0.7 0.6 0.6 0.5 0.6  PROT 7.8 7.3 6.6 6.5 6.6  ALBUMIN 3.8 3.6 3.2* 3.2* 2.9*    CBG: Recent Labs  Lab 07/02/21 2344 07/03/21 0557  GLUCAP 90 71     Recent Results (from the past 240 hour(s))  Resp Panel by RT-PCR (Flu  Nicholas Oconnell&B, Covid) Nasopharyngeal Swab     Status: None   Collection Time: 06/28/21  8:03 PM   Specimen: Nasopharyngeal Swab; Nasopharyngeal(NP) swabs in vial transport medium  Result Value Ref Range Status   SARS Coronavirus 2 by RT PCR NEGATIVE NEGATIVE Final    Comment: (NOTE) SARS-CoV-2 target nucleic acids are NOT DETECTED.  The SARS-CoV-2 RNA is generally detectable in upper respiratory specimens during the acute phase of infection. The lowest concentration of SARS-CoV-2 viral copies this assay can detect is 138 copies/mL. Nicholas Oconnell negative result does not preclude SARS-Cov-2 infection and should not be used as the sole basis for treatment or other patient management decisions. Nicholas Oconnell negative result may occur with  improper specimen collection/handling, submission of specimen other than nasopharyngeal swab, presence of viral mutation(s) within the areas targeted by this assay, and inadequate number of viral copies(<138 copies/mL). Nicholas Oconnell negative result must be combined with clinical observations, patient history, and epidemiological information. The expected result is Negative.  Fact Sheet for Patients:  Nicholas Oconnell  Fact Sheet for Healthcare Providers:   Nicholas Oconnell  This test is no t yet approved or cleared by the [place] FDA and  has been authorized for detection and/or diagnosis of SARS-CoV-2 by FDA under an Emergency Use Authorization (EUA). This EUA will remain  in effect (meaning this test can be used) for the duration of the COVID-19 declaration under Section 564(b)(1) of the Act, 21 U.S.C.section 360bbb-3(b)(1), unless the authorization is terminated  or revoked sooner.       Influenza Nicholas Oconnell by PCR NEGATIVE NEGATIVE Final   Influenza B by PCR NEGATIVE NEGATIVE Final    Comment: (NOTE) The Xpert Xpress SARS-CoV-2/FLU/RSV plus assay is intended as an aid in the diagnosis of influenza from Nasopharyngeal swab specimens and should not be used as Nicholas Oconnell sole basis for treatment. Nasal washings and aspirates are unacceptable for Xpert Xpress SARS-CoV-2/FLU/RSV testing.  Fact Sheet for Patients: Nicholas Oconnell  Fact Sheet for Healthcare Providers: Nicholas Oconnell  This test is not yet approved or cleared by the [place] FDA and has been authorized for detection and/or diagnosis of SARS-CoV-2 by FDA under an Emergency Use Authorization (EUA). This EUA will remain in effect (meaning this test can be used) for the duration of the COVID-19 declaration under Section 564(b)(1) of the Act, 21 U.S.C. section 360bbb-3(b)(1), unless the authorization is terminated or revoked.  Performed at [facility], [place] [address] [place], [place] [address]          Radiology Studies: DG Abd 1 View  Result Date: 07/02/2021 CLINICAL DATA:  Adynamic ileus EXAM: ABDOMEN - 1 VIEW COMPARISON:  Multiple priors including most recent radiograph June 21, 2021. FINDINGS: Nasogastric tube with tip and side port overlying the left upper quadrant. Left abdominal surgical clips. Increased gaseous dilation of large bowel with the colonic loop of  bowel projecting over the mid abdomen now measuring 12 cm previously 9.5 cm. No gas in the rectal vault. Pelvic phleboliths. IMPRESSION: 1. Increased gaseous dilation of large bowel consistent with worsening ileus/obstruction. 2. Stable nasogastric tube. Electronically Signed   By: Nicholas Oconnell M.D.   On: 07/02/2021 09:46   DG CHEST PORT 1 VIEW  Result Date: 07/03/2021 CLINICAL DATA:  Cough EXAM: PORTABLE CHEST 1 VIEW COMPARISON:  06/28/2021 chest radiograph. FINDINGS: Surgical clips in the left upper quadrant of the abdomen. Stable cardiomediastinal silhouette with normal heart size. No pneumothorax. No pleural effusion. Streaky consolidation at the right lung base is new. No pulmonary edema. IMPRESSION:  New streaky right lung base consolidation, suspicious for pneumonia. Recommend follow-up post treatment chest radiographs to resolution. Electronically Signed   By: Nicholas Oconnell M.D.   On: 07/03/2021 14:07        Scheduled Meds:  chlorhexidine  15 mL Mouth Rinse BID   [START ON 07/04/2021] divalproex  1,000 mg Oral QHS   divalproex  500 mg Oral Once   haloperidol  20 mg Oral TID   lithium carbonate  150 mg Oral Daily   lithium carbonate  300 mg Oral QHS   mouth rinse  15 mL Mouth Rinse q12n4p   pantoprazole (PROTONIX) IV  40 mg Intravenous Q24H   polyethylene glycol  17 g Oral TID   propranolol  10 mg Oral TID   QUEtiapine  400 mg Oral BID   senna-docusate  2 tablet Oral BID   sodium chloride flush  3 mL Intravenous Q12H   zolpidem  10 mg Oral QHS   Continuous Infusions:  lactated ringers with kcl 75 mL/hr at 07/03/21 2011     LOS: 5 days    Time spent: over 1 min    Fayrene Helper, MD [facility]   To contact the attending provider between 7A-7P or the covering provider during after hours 7P-7A, please log into the web site [email or web address] and access using universal  password for that web site. If you do not have the password, please call the hospital  operator.  07/03/2021, 8:13 PM

## 2021-07-03 NOTE — Plan of Care (Signed)
Discussed with patient plan of care for the evening, pain management and medications and fluids with NG tube with some teach back displayed.  Reeducation needed.  Problem: Activity: Goal: Ability to tolerate increased activity will improve Outcome: Progressing   Problem: Pain Managment: Goal: General experience of comfort will improve Outcome: Progressing

## 2021-07-04 DIAGNOSIS — D1771 Benign lipomatous neoplasm of kidney: Secondary | ICD-10-CM

## 2021-07-04 LAB — CBC WITH DIFFERENTIAL/PLATELET
Abs Immature Granulocytes: 0.05 10*3/uL (ref 0.00–0.07)
Basophils Absolute: 0 10*3/uL (ref 0.0–0.1)
Basophils Relative: 0 %
Eosinophils Absolute: 0.2 10*3/uL (ref 0.0–0.5)
Eosinophils Relative: 4 %
HCT: 39.1 % (ref 39.0–52.0)
Hemoglobin: 12.8 g/dL — ABNORMAL LOW (ref 13.0–17.0)
Immature Granulocytes: 1 %
Lymphocytes Relative: 25 %
Lymphs Abs: 1.4 10*3/uL (ref 0.7–4.0)
MCH: 29.5 pg (ref 26.0–34.0)
MCHC: 32.7 g/dL (ref 30.0–36.0)
MCV: 90.1 fL (ref 80.0–100.0)
Monocytes Absolute: 0.7 10*3/uL (ref 0.1–1.0)
Monocytes Relative: 13 %
Neutro Abs: 3.3 10*3/uL (ref 1.7–7.7)
Neutrophils Relative %: 57 %
Platelets: 276 10*3/uL (ref 150–400)
RBC: 4.34 MIL/uL (ref 4.22–5.81)
RDW: 14.8 % (ref 11.5–15.5)
WBC: 5.7 10*3/uL (ref 4.0–10.5)
nRBC: 0 % (ref 0.0–0.2)

## 2021-07-04 LAB — COMPREHENSIVE METABOLIC PANEL
ALT: 14 U/L (ref 0–44)
AST: 25 U/L (ref 15–41)
Albumin: 3.3 g/dL — ABNORMAL LOW (ref 3.5–5.0)
Alkaline Phosphatase: 91 U/L (ref 38–126)
Anion gap: 10 (ref 5–15)
BUN: 18 mg/dL (ref 8–23)
CO2: 23 mmol/L (ref 22–32)
Calcium: 9 mg/dL (ref 8.9–10.3)
Chloride: 108 mmol/L (ref 98–111)
Creatinine, Ser: 1.42 mg/dL — ABNORMAL HIGH (ref 0.61–1.24)
GFR, Estimated: 56 mL/min — ABNORMAL LOW (ref >60)
Glucose, Bld: 80 mg/dL (ref 70–99)
Potassium: 4.6 mmol/L (ref 3.5–5.1)
Sodium: 141 mmol/L (ref 135–145)
Total Bilirubin: 0.6 mg/dL (ref 0.3–1.2)
Total Protein: 7.1 g/dL (ref 6.5–8.1)

## 2021-07-04 MED ORDER — HEPARIN SODIUM (PORCINE) 5000 UNIT/ML IJ SOLN
5000.0000 [IU] | Freq: Three times a day (TID) | INTRAMUSCULAR | Status: DC
  Administered 2021-07-04 – 2021-07-08 (×11): 5000 [IU] via SUBCUTANEOUS
  Filled 2021-07-04 (×11): qty 1

## 2021-07-04 NOTE — Progress Notes (Addendum)
Clinical swallow evaluation completed, full report to follow.  Pt with labial extrapyramidal movement, ? Tremor  and lingual deviation to the right (*slight) upon protrusion.  Oral dysphagia - likely decreased lingual coordination and baseline wet vocal quality. Upon cueing pt to cough or clear his throat, he did not clear wet vocal quality. Overt cough - ? Aspiraiton of water when pt was "sucking" up from the cup - presumed inhalation prior to swallow.   He did pass a 3 ounce yale swallow water challenge - and his SBO prohibits MBS.  Recommend start clears with strict precautions if GI agrees and SLP will follow up.   Kathleen Lime, [place] SLP [place] Office [phone] Cell [phone]

## 2021-07-04 NOTE — Progress Notes (Signed)
[facility] Progress Note  Kelechi Orgeron 61 y.o. 06/12/1960  CC:  Ileus/pseudo-obstruction   Subjective: Patient reports he is feeling better today.  Denies abdominal pain, nausea, vomiting. Per RN report, he has had 2 bowel movements in the last 24 hours. Patient no longer has NG tube as he pulled it out yesterday.  ROS : Review of Systems  Unable to perform ROS: Psychiatric disorder     Objective: Vital signs in last 24 hours: Vitals:   07/03/21 2008 07/04/21 0440  BP: (!) 159/95 (!) 152/85  Pulse: 77 66  Resp: 18 16  Temp: 99 F (37.2 C) 98.7 F (37.1 C)  SpO2: 90% 100%    Physical Exam:  General:  Alert, cooperative, no distress, appears stated age  Head:  Normocephalic, without obvious abnormality, atraumatic  Eyes:  Anicteric sclera, EOM's intact  Lungs:   Clear to auscultation bilaterally, respirations unlabored  Heart:  Regular rate and rhythm, S1, S2 normal  Abdomen:   Abdomen distended, tense, distant bowel sounds in all quadrants.  Nontender.    Lab Results: Recent Labs    07/02/21 0525 07/03/21 0502 07/04/21 0809  NA 140 143 141  K 4.1 4.6 4.6  CL 107 109 108  CO2 23 25 23   GLUCOSE 86 78 80  BUN 18 16 18   CREATININE 1.46* 1.51* 1.42*  CALCIUM 8.6* 8.7* 9.0  MG 2.3 2.3  --   PHOS 4.2 4.9*  --    Recent Labs    07/03/21 0502 07/04/21 0809  AST 20 25  ALT 11 14  ALKPHOS 85 91  BILITOT 0.6 0.6  PROT 6.6 7.1  ALBUMIN 2.9* 3.3*   Recent Labs    07/03/21 0502 07/04/21 0809  WBC 6.9 5.7  NEUTROABS 3.6 3.3  HGB 11.9* 12.8*  HCT 36.3* 39.1  MCV 91.2 90.1  PLT PLATELET CLUMPS NOTED ON SMEAR, UNABLE TO ESTIMATE 276   No results for input(s): LABPROT, INR in the last 72 hours.    Assessment Ileus/pseudo-obstruction; history of Ogilvie's syndrome and partial colectomy - abdominal xray 1/29: persistent increased gaseous dilation of large bowel with worsening ileus/obstruction - potassium 4.6 - magnesium 2.3 - No  leukocytosis - Stable renal function: BUN 18, creatinine 1.42   Plan: Continue to maintain magnesium above 2 and potassium at 4-4.5 Continue MiraLAX 3 times daily and Senokot 2 tablets twice daily Continue n.p.o.  Patient having bowel movements is reassuring, though physical exam does show tense abdomen with distant bowel sounds.  Could possibly benefit from repeat abdominal x-ray.  [name] will follow  Garnette Scheuermann PA-C 07/04/2021, 9:00 AM  Contact #  [phone]

## 2021-07-04 NOTE — Evaluation (Signed)
Clinical/Bedside Swallow Evaluation Patient Details  Name: Nicholas Oconnell MRN: [id number] Date of Birth: July 19, 1960  Today's Date: 07/04/2021 Time: SLP Start Time (ACUTE ONLY): 0930 SLP Stop Time (ACUTE ONLY): 1008 SLP Time Calculation (min) (ACUTE ONLY): 38 min  Past Medical History:  Past Medical History:  Diagnosis Date   Anxiety    Bipolar affective ([place])    Cancer ([place])    Chronic kidney disease (CKD)    Cognitive deficits    Hypertension    Seizure ([place])    Past Surgical History: History reviewed. No pertinent surgical history. HPI:  Pt admitted from group home on 06/28/21 2* lethargy, AMS, and comitting and dx with AMS, PNA, GIB, and acute pseudo-obstruction of bowel.  Pt with hx of bipolar, seizure, CKD, cardiomegaly, schizoaffective disorder, and Ogilvie syndrome.  He required NG tube placement - which he pulled out.  GI following - Order for swallow eval received from hospitalist.    Assessment / Plan / Recommendation  Clinical Impression  Pt with labial and lingual Tremorous type movement and lingual deviation to the right (*slight) upon protrusion.  Oral dysphagia - likely c/b decreased lingual coordination, but pt also with baseline wet vocal quality. Upon cueing pt to cough or clear his throat, he did not clear wet vocal quality ? aspiration of secretions?  . Overt cough - ? Aspiration of water when pt was "sucking" up from the distal cup - presumed inhalation prior to swallow.   He did pass a 3 ounce yale swallow water challenge - and his SBO prohibits MBS.  Recommend start clears with strict precautions if GI agrees and SLP will follow up. SLP Visit Diagnosis: Dysphagia, oral phase (R13.11)    Aspiration Risk  Moderate aspiration risk;Severe aspiration risk    Diet Recommendation Thin liquid (clears)   Liquid Administration via: Cup;Straw Medication Administration: Via alternative means (defer to MD) Postural Changes: Seated upright at 90 degrees;Remain upright for at  least 30 minutes after po intake    Other  Recommendations Oral Care Recommendations: Oral care BID    Recommendations for follow up therapy are one component of a multi-disciplinary discharge planning process, led by the attending physician.  Recommendations may be updated based on patient status, additional functional criteria and insurance authorization.  Follow up Recommendations   TBD     Assistance Recommended at Discharge  Full assist  Functional Status Assessment Patient has had a recent decline in their functional status and demonstrates the ability to make significant improvements in function in a reasonable and predictable amount of time.  Frequency and Duration min 1 x/week  1 week       Prognosis Prognosis for Safe Diet Advancement: Fair      Swallow Study   General Date of Onset: 07/04/21 HPI: Pt admitted from group home on 06/28/21 2* lethargy, AMS, and comitting and dx with AMS, PNA, GIB, and acute pseudo-obstruction of bowel.  Pt with hx of bipolar, seizure, CKD, cardiomegaly, schizoaffective disorder, and Ogilvie syndrome.  He required NG tube placement - which he pulled out.  GI following - Order for swallow eval received from hospitalist. Type of Study: Bedside Swallow Evaluation Diet Prior to this Study: NPO Temperature Spikes Noted: No Respiratory Status: Room air History of Recent Intubation: No Behavior/Cognition: Alert;Cooperative;Pleasant mood Oral Cavity Assessment: Dry Oral Care Completed by SLP: No Oral Cavity - Dentition: Edentulous Vision: Functional for self-feeding Self-Feeding Abilities: Able to feed self Patient Positioning: Upright in bed Baseline Vocal Quality: Wet Volitional Cough: Weak  Volitional Swallow: Able to elicit    Oral/Motor/Sensory Function Overall Oral Motor/Sensory Function: Other (comment) (lingual deviation to right upon protrusion, appearance of tremor type lingual and labial movement)   Ice Chips Ice chips: Not tested    Thin Liquid Thin Liquid: Within functional limits Presentation: Cup;Straw;Spoon    Nectar Thick Nectar Thick Liquid: Within functional limits Presentation: Straw   Honey Thick Honey Thick Liquid: Not tested   Puree Puree: Within functional limits Other Comments: Jello   Solid     Solid: Not tested Other Comments: pt with small bowel issues - therefore only given clear liquids      Macario Golds 07/04/2021,12:17 PM   Kathleen Lime, MS [place] Office [phone] Cell [phone]

## 2021-07-04 NOTE — Progress Notes (Addendum)
PROGRESS NOTE    Nicholas Oconnell  TSV:[id number] DOB: [birth year] DOA: 06/28/2021 PCP: Neale Burly, MD  Chief Complaint  Patient presents with   Altered Mental Status   Fatigue   Hemoptysis    Brief Narrative:  Nicholas Oconnell is Nicholas Oconnell pleasant 61 y.o. male with medical history significant for schizoaffective disorder, CKD stage III, hypertension, Ogilvie syndrome, history of partial colectomy, now presenting to the emergency department with lethargy and hematemesis.  CT scan was concerning for enterocolitis.  GI was consulted.  He's been started on PPI and continued on his home bowel regimen.  S/p NG tube.  Currently working on gradually advancing with support of GI.      Assessment & Plan:   Principal Problem:   Acute pseudo-obstruction of bowel Active Problems:   GI bleeding   Pneumonia   Aspiration pneumonitis (HCC)   Acute encephalopathy   Cardiomegaly   Schizoaffective disorder, bipolar type ([name])   CKD (chronic kidney disease), stage III (HCC)   Angiomyolipoma of left kidney  Assessment and Plan: * Acute pseudo-obstruction of bowel- (present on admission) miralax TID for now per GI, senna BID Continue K>4 and mag>2 KUB with increased gaseous dilation of large bowel  Pulled NG out Appreciate GI assistance -> will gradually advance diet, holding lactulose per GI Started on clears BM yesterday and today   GI bleeding- (present on admission) CT abd/pelvis fluid filled bowel, findings concerning for enterocolitis Episode of hematemesis - possibly related to esophagitis from fluid and obstruction? Appreciate GI c/s, recommending daily PPI Maintain potassium >4 and magnesium >2 Will start DVT ppx cautiously with heparin given stability of Hb and lack of recurrence  Aspiration pneumonitis (HCC) With cough CXR with streaky right lung base consolidation on 1/30 will treat for now like aspiration pneumonitis SLP eval abx if fevers or rising white count, hold for now -  doing well from resp standpoint  Pneumonia- (present on admission) Nodular airspace dz in RLL measuring 19 mm Likely pneumonia Recommending CT follow up to ensure resolution ceftriaxone/azithromycin (completed 5 days)  Acute encephalopathy- (present on admission) Lethargy is improved Cognitive impairment and psychiatric illness at baseline Ammonia mildly elevated, will repeat TSH, b12, RPR wnl Lithium level WNL Valproic acid level wnl Home seroquel, depakote,lithium  Schizoaffective disorder, bipolar type ([name])- (present on admission) seroquel Nightly ambien  Cardiomegaly- (present on admission) Noted on CXR Echo with EF 70-75%, no RWMA, mild LVH   CKD (chronic kidney disease), stage III ([name])- (present on admission) Creatinine appears close to baseline  Angiomyolipoma of left kidney Follow outpatient   DVT prophylaxis: SCD -> heparin Code Status: full Family Communication: none Disposition:   Status is: Inpatient  Remains inpatient appropriate because: need for IV abx, advancing diet       Consultants:  GI  Procedures:  none  Antimicrobials:  Anti-infectives (From admission, onward)    Start     Dose/Rate Route Frequency Ordered Stop   06/28/21 2245  cefTRIAXone (ROCEPHIN) 2 g in sodium chloride 0.9 % 100 mL IVPB        2 g 200 mL/hr over 30 Minutes Intravenous Every 24 hours 06/28/21 2238 07/02/21 2323   06/28/21 2245  azithromycin (ZITHROMAX) 500 mg in sodium chloride 0.9 % 250 mL IVPB        500 mg 250 mL/hr over 60 Minutes Intravenous Every 24 hours 06/28/21 2238 07/03/21 0034       Subjective: No complaints Good spirits Says he had BM sucker  Objective: Vitals:  07/04/21 0440 07/04/21 0444 07/04/21 1314 07/04/21 1941  BP: (!) 152/85  (!) 150/93 (!) 138/98  Pulse: 66  70 (!) 109  Resp: 16  18 20   Temp: 98.7 F (37.1 C)  98.1 F (36.7 C) 99.5 F (37.5 C)  TempSrc: Oral  Oral Oral  SpO2: 100%  99% (!) 63%  Weight:  69.4 kg     Height:        Intake/Output Summary (Last 24 hours) at 07/04/2021 2029 Last data filed at 07/04/2021 1941 Gross per 24 hour  Intake 2071.8 ml  Output 2125 ml  Net -53.2 ml    Filed Weights   07/02/21 0500 07/03/21 0500 07/04/21 0444  Weight: 73.4 kg 69.1 kg 69.4 kg    Examination:  General: No acute distress. Cardiovascular: Heart sounds show Delano Scardino regular rate, and rhythm. No gallops or rubs. No murmurs. No JVD. Lungs: Clear to auscultation bilaterally with good air movement. No rales, rhonchi or wheezes. Abdomen: Soft, nontender, distended Neurological: alert, pleasant, conversant, moving all extremities Skin: Warm and dry. No rashes or lesions. Extremities: No clubbing or cyanosis. No edema.    Data Reviewed: I have personally reviewed following labs and imaging studies  CBC: Recent Labs  Lab 06/30/21 0512 07/01/21 0534 07/02/21 0525 07/03/21 0502 07/04/21 0809  WBC 10.6* 7.2 6.7 6.9 5.7  NEUTROABS 7.7 4.7 4.0 3.6 3.3  HGB 12.6* 12.3* 12.0* 11.9* 12.8*  HCT 39.9 38.6* 37.5* 36.3* 39.1  MCV 93.4 92.3 93.3 91.2 90.1  PLT 205 176 175 PLATELET CLUMPS NOTED ON SMEAR, UNABLE TO ESTIMATE 276     Basic Metabolic Panel: Recent Labs  Lab 06/29/21 0447 06/30/21 0512 07/01/21 0534 07/02/21 0525 07/03/21 0502 07/04/21 0809  NA 141 141 141 140 143 141  K 4.8 4.2 3.9 4.1 4.6 4.6  CL 106 104 106 107 109 108  CO2 24 27 25 23 25 23   GLUCOSE 108* 111* 93 86 78 80  BUN 18 15 18 18 16 18   CREATININE 1.64* 1.44* 1.50* 1.46* 1.51* 1.42*  CALCIUM 8.7* 8.8* 8.7* 8.6* 8.7* 9.0  MG 2.3 2.5* 2.4 2.3 2.3  --   PHOS  --  4.3 4.7* 4.2 4.9*  --      GFR: Estimated Creatinine Clearance: 48.7 mL/min (Ahren Pettinger) (by C-G formula based on SCr of 1.42 mg/dL (H)).  Liver Function Tests: Recent Labs  Lab 06/30/21 0512 07/01/21 0534 07/02/21 0525 07/03/21 0502 07/04/21 0809  AST 20 16 17 20 25   ALT 13 12 11 11 14   ALKPHOS 112 92 89 85 91  BILITOT 0.6 0.6 0.5 0.6 0.6  PROT 7.3 6.6  6.5 6.6 7.1  ALBUMIN 3.6 3.2* 3.2* 2.9* 3.3*     CBG: Recent Labs  Lab 07/02/21 2344 07/03/21 0557  GLUCAP 90 71      Recent Results (from the past 240 hour(s))  Resp Panel by RT-PCR (Flu Bradlee Bridgers&B, Covid) Nasopharyngeal Swab     Status: None   Collection Time: 06/28/21  8:03 PM   Specimen: Nasopharyngeal Swab; Nasopharyngeal(NP) swabs in vial transport medium  Result Value Ref Range Status   SARS Coronavirus 2 by RT PCR NEGATIVE NEGATIVE Final    Comment: (NOTE) SARS-CoV-2 target nucleic acids are NOT DETECTED.  The SARS-CoV-2 RNA is generally detectable in upper respiratory specimens during the acute phase of infection. The lowest concentration of SARS-CoV-2 viral copies this assay can detect is 138 copies/mL. Sevin Farone negative result does not preclude SARS-Cov-2 infection and should not be used as  the sole basis for treatment or other patient management decisions. Quetzally Callas negative result may occur with  improper specimen collection/handling, submission of specimen other than nasopharyngeal swab, presence of viral mutation(s) within the areas targeted by this assay, and inadequate number of viral copies(<138 copies/mL). Isadore Palecek negative result must be combined with clinical observations, patient history, and epidemiological information. The expected result is Negative.  Fact Sheet for Patients:  EntrepreneurPulse.com.au  Fact Sheet for Healthcare Providers:  IncredibleEmployment.be  This test is no t yet approved or cleared by the [place] FDA and  has been authorized for detection and/or diagnosis of SARS-CoV-2 by FDA under an Emergency Use Authorization (EUA). This EUA will remain  in effect (meaning this test can be used) for the duration of the COVID-19 declaration under Section 564(b)(1) of the Act, 21 U.S.C.section 360bbb-3(b)(1), unless the authorization is terminated  or revoked sooner.       Influenza [place]en Watchman by PCR NEGATIVE NEGATIVE Final    Influenza B by PCR NEGATIVE NEGATIVE Final    Comment: (NOTE) The Xpert Xpress SARS-CoV-2/FLU/RSV plus assay is intended as an aid in the diagnosis of influenza from Nasopharyngeal swab specimens and should not be used as [place] sole basis for treatment. Nasal washings and aspirates are unacceptable for Xpert Xpress SARS-CoV-2/FLU/RSV testing.  Fact Sheet for Patients: EntrepreneurPulse.com.au  Fact Sheet for Healthcare Providers: IncredibleEmployment.be  This test is not yet approved or cleared by the [place] FDA and has been authorized for detection and/or diagnosis of SARS-CoV-2 by FDA under an Emergency Use Authorization (EUA). This EUA will remain in effect (meaning this test can be used) for the duration of the COVID-19 declaration under Section 564(b)(1) of the Act, 21 U.S.C. section 360bbb-3(b)(1), unless the authorization is terminated or revoked.  Performed at [facility], [place] [address] [place], [place] [address]           Radiology Studies: DG CHEST PORT 1 VIEW  Result Date: 07/03/2021 CLINICAL DATA:  Cough EXAM: PORTABLE CHEST 1 VIEW COMPARISON:  06/28/2021 chest radiograph. FINDINGS: Surgical clips in the left upper quadrant of the abdomen. Stable cardiomediastinal silhouette with normal heart size. No pneumothorax. No pleural effusion. Streaky consolidation at the right lung base is new. No pulmonary edema. IMPRESSION: New streaky right lung base consolidation, suspicious for pneumonia. Recommend follow-up post treatment chest radiographs to resolution. Electronically Signed   By: Ilona Sorrel M.D.   On: 07/03/2021 14:07        Scheduled Meds:  chlorhexidine  15 mL Mouth Rinse BID   divalproex  1,000 mg Oral QHS   haloperidol  20 mg Oral TID   heparin injection (subcutaneous)  5,000 Units Subcutaneous Q8H   lithium carbonate  150 mg Oral Daily   lithium carbonate  300 mg Oral QHS   mouth rinse  15  mL Mouth Rinse q12n4p   pantoprazole (PROTONIX) IV  40 mg Intravenous Q24H   polyethylene glycol  17 g Oral TID   propranolol  10 mg Oral TID   QUEtiapine  400 mg Oral BID   senna-docusate  2 tablet Oral BID   sodium chloride flush  3 mL Intravenous Q12H   zolpidem  10 mg Oral QHS   Continuous Infusions:  lactated ringers with kcl 75 mL/hr at 07/03/21 2011     LOS: 6 days    Time spent: over 30 min    Fayrene Helper, MD [facility]   To contact the attending provider between 7A-7P or the covering  provider during after hours 7P-7A, please log into the web site [email or web address] and access using universal [place] password for that web site. If you do not have the password, please call the hospital operator.  07/04/2021, 8:29 PM

## 2021-07-04 NOTE — Assessment & Plan Note (Addendum)
-   Outpatient follow-up °

## 2021-07-04 NOTE — Progress Notes (Signed)
Physical Therapy Treatment Patient Details Name: Nicholas Oconnell MRN: [id number] DOB: 04-Jun-1961 Today's Date: 07/04/2021   History of Present Illness Pt admitted from group home on 06/28/21 2* lethargy, AMS, and comitting and dx with AMS, PNA, GIB, and acute pseudo-obstruction of bowel.  Pt with hx of bipolar, seizure, CKD, cardiomegaly, schizoaffective disorder, and Ogilvie syndrome    PT Comments    Pt making gradual progress but continues to need mod-max cues for sequencing/safety due to cognitive deficits (unsure of baseline).  Only required min g  - min A physical assist. Continue to progress as able.     Recommendations for follow up therapy are one component of a multi-disciplinary discharge planning process, led by the attending physician.  Recommendations may be updated based on patient status, additional functional criteria and insurance authorization.  Follow Up Recommendations  Home health PT     Assistance Recommended at Discharge Frequent or constant Supervision/Assistance  Patient can return home with the following A little help with walking and/or transfers;A little help with bathing/dressing/bathroom;Assistance with cooking/housework;Assist for transportation;Help with stairs or ramp for entrance;Direct supervision/assist for medications management;Direct supervision/assist for financial management   Equipment Recommendations  None recommended by PT    Recommendations for Other Services       Precautions / Restrictions Precautions Precautions: Fall Restrictions Weight Bearing Restrictions: No     Mobility  Bed Mobility Overal bed mobility: Needs Assistance Bed Mobility: Supine to Sit, Sit to Supine     Supine to sit: Min assist Sit to supine: Supervision   General bed mobility comments: Pt having difficulty following commands to sit on his own (cues for rolling and reaching for rail) and difficulty with motor planning - required hand to pull up on and mod  cues.  Returned to supine without assist    Transfers Overall transfer level: Needs assistance Equipment used: Rolling walker (2 wheels) Transfers: Sit to/from Stand Sit to Stand: Min guard, Supervision           General transfer comment: Min guard progressing to supervision; sit to stand x 6 during session with repeated cues to push up with hands    Ambulation/Gait Ambulation/Gait assistance: Min assist Gait Distance (Feet): 400 Feet Assistive device: Rolling walker (2 wheels) Gait Pattern/deviations: Step-to pattern, Decreased stride length, Shuffle Gait velocity: decreased     General Gait Details: Tending to drift left with frequent cues to correct and for RW proximity and to push RW.  Required min A for RW to turn around and with navigating around objects; max cues for direction   Stairs             Wheelchair Mobility    Modified Rankin (Stroke Patients Only)       Balance Overall balance assessment: Needs assistance Sitting-balance support: No upper extremity supported, Feet supported Sitting balance-Leahy Scale: Good     Standing balance support: Bilateral upper extremity supported, Reliant on assistive device for balance Standing balance-Leahy Scale: Poor                              Cognition Arousal/Alertness: Awake/alert Behavior During Therapy: Impulsive Overall Cognitive Status: No family/caregiver present to determine baseline cognitive functioning                                 General Comments: hx of cog impairments but unsure baseline; follows simple commands, needs  repetition for more complex commands; little carry over with transfer cues; difficulty with initiation , sequencing and planning        Exercises Other Exercises Other Exercises: STS x 5 then wanting to rest    General Comments General comments (skin integrity, edema, etc.): VSS      Pertinent Vitals/Pain Pain Assessment Pain Assessment:  No/denies pain    Home Living                          Prior Function            PT Goals (current goals can now be found in the care plan section) Progress towards PT goals: Progressing toward goals    Frequency    Min 3X/week      PT Plan Current plan remains appropriate    Co-evaluation              AM-PAC PT "6 Clicks" Mobility   Outcome Measure  Help needed turning from your back to your side while in a flat bed without using bedrails?: A Little Help needed moving from lying on your back to sitting on the side of a flat bed without using bedrails?: A Lot (cues) Help needed moving to and from a bed to a chair (including a wheelchair)?: A Little Help needed standing up from a chair using your arms (e.g., wheelchair or bedside chair)?: A Little Help needed to walk in hospital room?: A Lot (min A mod cues) Help needed climbing 3-5 steps with a railing? : A Little 6 Click Score: 16    End of Session Equipment Utilized During Treatment: Gait belt Activity Tolerance: Patient tolerated treatment well Patient left: in bed;with call bell/phone within reach;with bed alarm set Nurse Communication: Mobility status PT Visit Diagnosis: Unsteadiness on feet (R26.81);Difficulty in walking, not elsewhere classified (R26.2)     Time: 3785-8850 PT Time Calculation (min) (ACUTE ONLY): 19 min  Charges:  $Gait Training: 8-22 mins                     Abran Richard, PT Acute Rehab Services Pager [phone] [facility] 07/04/2021, 11:34 AM

## 2021-07-05 ENCOUNTER — Inpatient Hospital Stay (HOSPITAL_COMMUNITY): Payer: Medicaid Other

## 2021-07-05 NOTE — Progress Notes (Signed)
SLP Cancellation Note  Patient Details Name: Bernie Ransford MRN: [id number] DOB: [birth year]   Cancelled treatment:       Reason Eval/Treat Not Completed: Other (comment) (note concern for ongoing colonic dilatation likely ileus per imaging report, will await GI input)   Macario Golds 07/05/2021, 8:53 AM  Kathleen Lime, [place] SLP [place] Office [phone] Cell [phone]

## 2021-07-05 NOTE — Progress Notes (Signed)
PROGRESS NOTE    Nicholas Oconnell  JKK:[id number] DOB: 24-Jul-1960 DOA: 06/28/2021 PCP: Neale Burly, MD   Brief Narrative/Hospital Course: Nicholas Oconnell, 61 y.o. male with medical history significant for schizoaffective disorder, CKD stage III, hypertension, Ogilvie syndrome, history of partial colectomy, now presenting to the emergency department with lethargy and hematemesis.  CT scan was concerning for enterocolitis.  GI was consulted.  He's been started on PPI and continued on his home bowel regimen.  S/p NG tube.  Currently working on gradually advancing with support of GI. X-ray shows persistent bowel dilatation but patient continued to have bowel movement, diet is being advanced slowly.    Subjective: Seen and examined this morning.  Alert awake oriented pleasant.  Denies nausea vomiting abdominal pain.  Assessment and Plan: * Acute pseudo-obstruction of bowel- (present on admission) History of Ogilvie's syndrome and partial colectomy.  Off NGT.  Being managed conservatively. x-ray abdomen 2/1 unchanged dilatation of the colon mostly due to ileus.  GI following closely, patient having bowel movement and not in distress.keep potassium above 4 and mag above 2, continue laxatives, advance diet to FLD as tolerated.  Aspiration pneumonitis [facility]) Patient completed antibiotics.     Acute encephalopathy- (present on admission) Patient had lethargy but has resolved.  Cognitive impairment and psychiatric illness at baseline, work-up unyielding with slightly elevated ammonia, normal level-TSH B12, RPR, lithium level, valproic acid level.  Continue home Seroquel Depakote lithium.     Pneumonia- (present on admission) Imaging showed nodular airspace disease in RLL up to 90 mm suspected pneumonia managing antibiotics x5 days will need CT follow-up in 2 to 3 weeks to ensure resolution   GI bleeding- (present on admission) Episode of hematemesis likely from esophagitis/pseudoobstruction.   Seen by GI no evidence of active bleeding. CT abd/pelvis fluid filled bowel.  Hb stable. Recent Labs  Lab 06/30/21 0512 07/01/21 0534 07/02/21 0525 07/03/21 0502 07/04/21 0809  HGB 12.6* 12.3* 12.0* 11.9* 12.8*  HCT 39.9 38.6* 37.5* 36.3* 39.1    Angiomyolipoma of left kidney Outpatient follow-up.    Schizoaffective disorder, bipolar type ([place])- (present on admission) Mood stable.  Continue Seroquel and nightly Ambien.    Cardiomegaly- (present on admission) Seen in chest x-ray, echo with EF 70 to 75%,no RWMA, mild LVH   CKD (chronic kidney disease), stage III ([place])- (present on admission) Renal function overall stable. Recent Labs  Lab 06/30/21 0512 07/01/21 0534 07/02/21 0525 07/03/21 0502 07/04/21 0809  BUN 15 18 18 16 18   CREATININE 1.44* 1.50* 1.46* 1.51* 1.42*     DVT prophylaxis: heparin injection 5,000 Units Start: 07/04/21 2200 Place and maintain sequential compression device Start: 07/03/21 2011 SCDs Start: 06/28/21 2236 Code Status:   Code Status: Full Code Family Communication: plan of care discussed with patient at bedside. Disposition: Currently not medically stable for discharge. Status is: Inpatient Remains inpatient appropriate because: For ongoing management of pseudoobstruction and evaluation for diet tolerance  Planned Discharge Destination: Home with Home Health       Objective: Vitals last 24 hrs: Vitals:   07/04/21 2115 07/04/21 2333 07/05/21 0442 07/05/21 0443  BP: (!) 176/101 (!) 168/98 (!) 144/87   Pulse: 72 73 65   Resp: 18  14   Temp: 98.5 F (36.9 C)  97.6 F (36.4 C)   TempSrc: Oral  Oral   SpO2: 98%  97%   Weight:    68.8 kg  Height:       Weight change: -0.6 kg  Physical Examination: General  exam: AA0, interacting, weak,older than stated age. HEENT:Oral mucosa moist, Ear/Nose WNL grossly,dentition normal. Respiratory system: B/l clear BS, no use of accessory muscle, non tender. Cardiovascular system: S1 & S2 +,No  JVD. Gastrointestinal system: Abdomen moderately distended, bowel sounds sluggish, previously scar present,NT. Nervous System:Alert, awake, moving extremities. Extremities: edema none, distal peripheral pulses palpable.  Skin: No rashes, no icterus. MSK: Normal muscle bulk, tone, power.  Medications reviewed:  Scheduled Meds:  chlorhexidine  15 mL Mouth Rinse BID   divalproex  1,000 mg Oral QHS   haloperidol  20 mg Oral TID   heparin injection (subcutaneous)  5,000 Units Subcutaneous Q8H   lithium carbonate  150 mg Oral Daily   lithium carbonate  300 mg Oral QHS   mouth rinse  15 mL Mouth Rinse q12n4p   pantoprazole (PROTONIX) IV  40 mg Intravenous Q24H   polyethylene glycol  17 g Oral TID   propranolol  10 mg Oral TID   QUEtiapine  400 mg Oral BID   senna-docusate  2 tablet Oral BID   sodium chloride flush  3 mL Intravenous Q12H   zolpidem  10 mg Oral QHS   Continuous Infusions:  lactated ringers with kcl 75 mL/hr at 07/05/21 0454      Diet Order             Diet full liquid Room service appropriate? Yes; Fluid consistency: Thin  Diet effective now                            Intake/Output Summary (Last 24 hours) at 07/05/2021 1329 Last data filed at 07/05/2021 0935 Gross per 24 hour  Intake 347.44 ml  Output 1200 ml  Net -852.56 ml   Net IO Since Admission: -1,625.42 mL [07/05/21 1329]  Wt Readings from Last 3 Encounters:  07/05/21 68.8 kg  04/28/21 64 kg  04/20/21 64 kg     Unresulted Labs (From admission, onward)    None     Data Reviewed: I have personally reviewed following labs and imaging studies CBC: Recent Labs  Lab 06/30/21 0512 07/01/21 0534 07/02/21 0525 07/03/21 0502 07/04/21 0809  WBC 10.6* 7.2 6.7 6.9 5.7  NEUTROABS 7.7 4.7 4.0 3.6 3.3  HGB 12.6* 12.3* 12.0* 11.9* 12.8*  HCT 39.9 38.6* 37.5* 36.3* 39.1  MCV 93.4 92.3 93.3 91.2 90.1  PLT 205 176 175 PLATELET CLUMPS NOTED ON SMEAR, UNABLE TO ESTIMATE 119   Basic Metabolic  Panel: Recent Labs  Lab 06/29/21 0447 06/30/21 0512 07/01/21 0534 07/02/21 0525 07/03/21 0502 07/04/21 0809  NA 141 141 141 140 143 141  K 4.8 4.2 3.9 4.1 4.6 4.6  CL 106 104 106 107 109 108  CO2 24 27 25 23 25 23   GLUCOSE 108* 111* 93 86 78 80  BUN 18 15 18 18 16 18   CREATININE 1.64* 1.44* 1.50* 1.46* 1.51* 1.42*  CALCIUM 8.7* 8.8* 8.7* 8.6* 8.7* 9.0  MG 2.3 2.5* 2.4 2.3 2.3  --   PHOS  --  4.3 4.7* 4.2 4.9*  --    GFR: Estimated Creatinine Clearance: 48.7 mL/min (A) (by C-G formula based on SCr of 1.42 mg/dL (H)). Liver Function Tests: Recent Labs  Lab 06/30/21 0512 07/01/21 0534 07/02/21 0525 07/03/21 0502 07/04/21 0809  AST 20 16 17 20 25   ALT 13 12 11 11 14   ALKPHOS 112 92 89 85 91  BILITOT 0.6 0.6 0.5 0.6 0.6  PROT 7.3 6.6 6.5  6.6 7.1  ALBUMIN 3.6 3.2* 3.2* 2.9* 3.3*   Recent Labs  Lab 06/28/21 2002  LIPASE 33   Recent Labs  Lab 06/28/21 2348 07/03/21 0502  AMMONIA 37* 40*   Coagulation Profile: Recent Labs  Lab 06/28/21 2035  INR 1.0   Cardiac Enzymes: No results for input(s): CKTOTAL, CKMB, CKMBINDEX, TROPONINI in the last 168 hours. BNP (last 3 results) No results for input(s): PROBNP in the last 8760 hours. HbA1C: No results for input(s): HGBA1C in the last 72 hours. CBG: Recent Labs  Lab 07/02/21 2344 07/03/21 0557  GLUCAP 90 71   Lipid Profile: No results for input(s): CHOL, HDL, LDLCALC, TRIG, CHOLHDL, LDLDIRECT in the last 72 hours. Thyroid Function Tests: No results for input(s): TSH, T4TOTAL, FREET4, T3FREE, THYROIDAB in the last 72 hours. Anemia Panel: No results for input(s): VITAMINB12, FOLATE, FERRITIN, TIBC, IRON, RETICCTPCT in the last 72 hours. Sepsis Labs: Recent Labs  Lab 06/28/21 2048  LATICACIDVEN 1.8    Recent Results (from the past 240 hour(s))  Resp Panel by RT-PCR (Flu A&B, Covid) Nasopharyngeal Swab     Status: None   Collection Time: 06/28/21  8:03 PM   Specimen: Nasopharyngeal Swab; Nasopharyngeal(NP)  swabs in vial transport medium  Result Value Ref Range Status   SARS Coronavirus 2 by RT PCR NEGATIVE NEGATIVE Final    Comment: (NOTE) SARS-CoV-2 target nucleic acids are NOT DETECTED.  The SARS-CoV-2 RNA is generally detectable in upper respiratory specimens during the acute phase of infection. The lowest concentration of SARS-CoV-2 viral copies this assay can detect is 138 copies/mL. A negative result does not preclude SARS-Cov-2 infection and should not be used as the sole basis for treatment or other patient management decisions. A negative result may occur with  improper specimen collection/handling, submission of specimen other than nasopharyngeal swab, presence of viral mutation(s) within the areas targeted by this assay, and inadequate number of viral copies(<138 copies/mL). A negative result must be combined with clinical observations, patient history, and epidemiological information. The expected result is Negative.  Fact Sheet for Patients:  EntrepreneurPulse.com.au  Fact Sheet for Healthcare Providers:  IncredibleEmployment.be  This test is no t yet approved or cleared by the [place] FDA and  has been authorized for detection and/or diagnosis of SARS-CoV-2 by FDA under an Emergency Use Authorization (EUA). This EUA will remain  in effect (meaning this test can be used) for the duration of the COVID-19 declaration under Section 564(b)(1) of the Act, 21 U.S.C.section 360bbb-3(b)(1), unless the authorization is terminated  or revoked sooner.       Influenza A by PCR NEGATIVE NEGATIVE Final   Influenza B by PCR NEGATIVE NEGATIVE Final    Comment: (NOTE) The Xpert Xpress SARS-CoV-2/FLU/RSV plus assay is intended as an aid in the diagnosis of influenza from Nasopharyngeal swab specimens and should not be used as a sole basis for treatment. Nasal washings and aspirates are unacceptable for Xpert Xpress  SARS-CoV-2/FLU/RSV testing.  Fact Sheet for Patients: EntrepreneurPulse.com.au  Fact Sheet for Healthcare Providers: IncredibleEmployment.be  This test is not yet approved or cleared by the [place] FDA and has been authorized for detection and/or diagnosis of SARS-CoV-2 by FDA under an Emergency Use Authorization (EUA). This EUA will remain in effect (meaning this test can be used) for the duration of the COVID-19 declaration under Section 564(b)(1) of the Act, 21 U.S.C. section 360bbb-3(b)(1), unless the authorization is terminated or revoked.  Performed at [facility], [address] [place],  [place] [address]     Antimicrobials: Anti-infectives (From admission, onward)    Start     Dose/Rate Route Frequency Ordered Stop   06/28/21 2245  cefTRIAXone (ROCEPHIN) 2 g in sodium chloride 0.9 % 100 mL IVPB        2 g 200 mL/hr over 30 Minutes Intravenous Every 24 hours 06/28/21 2238 07/02/21 2323   06/28/21 2245  azithromycin (ZITHROMAX) 500 mg in sodium chloride 0.9 % 250 mL IVPB        500 mg 250 mL/hr over 60 Minutes Intravenous Every 24 hours 06/28/21 2238 07/03/21 0034      Culture/Microbiology No results found for: SDES, SPECREQUEST, CULT, REPTSTATUS  Other culture-see note  Radiology Studies: DG Abd 1 View  Result Date: 07/05/2021 CLINICAL DATA:  Abdominal distension this morning EXAM: ABDOMEN - 1 VIEW COMPARISON:  07/02/2021 FINDINGS: Diffuse gaseous distension of the colon is similar to prior examinations. Multiple surgical clips again seen throughout the left abdomen. Nasogastric tube is no longer identified. IMPRESSION: Unchanged dilatation of the colon most likely due to ileus. Electronically Signed   By: Miachel Roux M.D.   On: 07/05/2021 08:15     LOS: 7 days   Antonieta Pert, MD [facility]  07/05/2021, 1:29 PM

## 2021-07-05 NOTE — Progress Notes (Signed)
[facility] Progress Note  Nicholas Oconnell 61 y.o. 05-27-1961  CC:   Ileus/pseudo-obstruction   Subjective: Patient denies abdominal pain.  Per RN report, had 2 bowel movements yesterday.  Patient tolerating clear liquids well.  ROS : Review of Systems  Unable to perform ROS: Psychiatric disorder     Objective: Vital signs in last 24 hours: Vitals:   07/04/21 2333 07/05/21 0442  BP: (!) 168/98 (!) 144/87  Pulse: 73 65  Resp:  14  Temp:  97.6 F (36.4 C)  SpO2:  97%    Physical Exam:  General:  Alert, cooperative, no distress, appears stated age  Head:  Normocephalic, without obvious abnormality, atraumatic  Eyes:  Anicteric sclera, EOM's intact  Lungs:   Clear to auscultation bilaterally, respirations unlabored  Heart:  Regular rate and rhythm, S1, S2 normal  Abdomen:   Soft, non-tender, bowel sounds active all four quadrants,  no masses,     Lab Results: Recent Labs    07/03/21 0502 07/04/21 0809  NA 143 141  K 4.6 4.6  CL 109 108  CO2 25 23  GLUCOSE 78 80  BUN 16 18  CREATININE 1.51* 1.42*  CALCIUM 8.7* 9.0  MG 2.3  --   PHOS 4.9*  --    Recent Labs    07/03/21 0502 07/04/21 0809  AST 20 25  ALT 11 14  ALKPHOS 85 91  BILITOT 0.6 0.6  PROT 6.6 7.1  ALBUMIN 2.9* 3.3*   Recent Labs    07/03/21 0502 07/04/21 0809  WBC 6.9 5.7  NEUTROABS 3.6 3.3  HGB 11.9* 12.8*  HCT 36.3* 39.1  MCV 91.2 90.1  PLT PLATELET CLUMPS NOTED ON SMEAR, UNABLE TO ESTIMATE 276   No results for input(s): LABPROT, INR in the last 72 hours.    Assessment Ileus/pseudo-obstruction; history of Ogilvie's syndrome and partial colectomy -Abdominal x-ray 2/1: Unchanged dilation of the colon most likely due to ileus. - potassium 4.6 - magnesium 2.3 - No leukocytosis - Stable renal function: BUN 18, creatinine 1.42  Plan: Patient appears to be clinically improving, though repeat abdominal x-ray shows unchanged dilation of the colon.  Patient continuing to have  bowel movements.  Tolerating clear liquids well. Continue to maintain magnesium above 2 and potassium at 4-4.5.  Continue laxatives Move diet up to full liquids as tolerated GI will follow  Garnette Scheuermann PA-C 07/05/2021, 11:53 AM  Contact #  [phone]

## 2021-07-06 LAB — BASIC METABOLIC PANEL
Anion gap: 7 (ref 5–15)
BUN: 15 mg/dL (ref 8–23)
CO2: 25 mmol/L (ref 22–32)
Calcium: 8.9 mg/dL (ref 8.9–10.3)
Chloride: 105 mmol/L (ref 98–111)
Creatinine, Ser: 1.33 mg/dL — ABNORMAL HIGH (ref 0.61–1.24)
GFR, Estimated: >60 mL/min (ref >60)
Glucose, Bld: 85 mg/dL (ref 70–99)
Potassium: 4.6 mmol/L (ref 3.5–5.1)
Sodium: 137 mmol/L (ref 135–145)

## 2021-07-06 LAB — CBC
HCT: 41.1 % (ref 39.0–52.0)
Hemoglobin: 13.5 g/dL (ref 13.0–17.0)
MCH: 29.9 pg (ref 26.0–34.0)
MCHC: 32.8 g/dL (ref 30.0–36.0)
MCV: 90.9 fL (ref 80.0–100.0)
Platelets: 287 10*3/uL (ref 150–400)
RBC: 4.52 MIL/uL (ref 4.22–5.81)
RDW: 14.9 % (ref 11.5–15.5)
WBC: 6.2 10*3/uL (ref 4.0–10.5)
nRBC: 0 % (ref 0.0–0.2)

## 2021-07-06 MED ORDER — PANTOPRAZOLE SODIUM 40 MG PO TBEC
40.0000 mg | DELAYED_RELEASE_TABLET | Freq: Every day | ORAL | Status: DC
  Administered 2021-07-06 – 2021-07-08 (×3): 40 mg via ORAL
  Filled 2021-07-06 (×3): qty 1

## 2021-07-06 NOTE — Progress Notes (Signed)
Speech Language Pathology Treatment: Dysphagia  Patient Details Name: Kimon Loewen MRN: [id number] DOB: 1960-12-19 Today's Date: 07/06/2021 Time: 4008-6761 SLP Time Calculation (min) (ACUTE ONLY): 20 min  Assessment / Plan / Recommendation Clinical Impression  Pt today seen for skilled SLP to address pt's dysphagia goals. Pt is alert and excited to eat/drink stating he wants "chocolate".  Observed pt with po intake including single bite of cracker, Ensure mixed with icecream and water.  Pt's voice at baseline was clear - only becoming mildly wet/gurgly after nearly entire snack (3/4 Ensure, 3.75 ounces ice cream, single bite of cracker and approx 2 ounces of water via straw.  He does not demonstrate any indication of aspiration.  Prolong "mastication" of cracker observed with oral retention on right lateral sulci without adequate awareness. Use of Ensure/icecream was helpful to clear.   Note GI planning to advance to "soft" - given pt's lack of dentures and oral deficits - SLP also recommends a Dys3 diet for airway protection.  Given prior findings of  Dilated fluid-filled distal esophagus on CT imaging during this admission, largest aspiration risk is likely esophageal and precautions may be helpful.    HPI HPI: Pt admitted from group home on 06/28/21 2* lethargy, AMS, and comitting and dx with AMS, PNA, GIB, and acute pseudo-obstruction of bowel.  Pt with hx of bipolar, seizure, CKD, cardiomegaly, schizoaffective disorder, and Ogilvie syndrome.  He required NG tube placement - which he pulled out.  GI following - Order for swallow eval received from hospitalist.      [place] with current plan of care      Recommendations for follow up therapy are one component of a multi-disciplinary discharge planning process, led by the attending physician.  Recommendations may be updated based on patient status, additional functional criteria and insurance authorization.    Recommendations   Diet recommendations: Dysphagia 3 (mechanical soft);Thin liquid Liquids provided via: Cup;Straw Medication Administration: Whole meds with puree Supervision: Patient able to self feed Compensations: Slow rate;Small sips/bites Postural Changes and/or Swallow Maneuvers: Seated upright 90 degrees;Upright 30-60 min after meal                Oral Care Recommendations: Oral care BID Follow Up Recommendations: No SLP follow up Assistance recommended at discharge: Frequent or constant Supervision/Assistance SLP Visit Diagnosis: Dysphagia, oral phase (R13.11) Plan: Continue with current plan of care         Kathleen Lime, MS [place] Cell [phone]   Macario Golds  07/06/2021, 4:48 PM

## 2021-07-06 NOTE — Progress Notes (Signed)
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

## 2021-07-06 NOTE — Progress Notes (Signed)
PROGRESS NOTE Nicholas Oconnell  WUJ:[id number] DOB: July 15, 1960 DOA: 06/28/2021 PCP: Neale Burly, MD   Brief Narrative/Hospital Course: Nicholas Oconnell, 61 y.o. male with medical history significant for schizoaffective disorder, CKD stage III, hypertension, Ogilvie syndrome, history of partial colectomy, now presenting to the emergency department with lethargy and hematemesis.  CT scan was concerning for enterocolitis.  GI was consulted.  He's been started on PPI and continued on his home bowel regimen.  S/p NG tube.  Currently working on gradually advancing with support of GI. X-ray shows persistent bowel dilatation but patient continued to have bowel movement, diet is being advanced slowly.  At this time patient diet is being advanced as per GI tolerating well.  Has been ambulating.  PT OT has recommended home health PT OT.   Subjective: Seen this morning.  Appears pleasant.  Not in distress. BM x1 yesterday.  Assessment and Plan: * Acute pseudo-obstruction of bowel- (present on admission) History of Ogilvie's syndrome and partial colectomy.  Off NGT.  Being managed conservatively. x-ray abdomen 2/1 unchanged dilatation of the colon mostly due to ileus.  GI following closely on FLD-having bowel movement once daily.He is ambulating.  Await GI to advance diet further hopefully can tolerate soft diet prior to discharge next 24 hours    Aspiration pneumonitis [facility]) Patient completed antibiotics.     Acute encephalopathy- (present on admission) Was lethargic, but resolved. W/ cognitive impairment and psychiatric illness at baseline, work-up unyielding with slightly elevated ammonia, normal level-TSH B12, RPR, lithium level, valproic acid level.  Continue home Seroquel Depakote lithium.     Pneumonia- (present on admission) Imaging showed nodular airspace disease in RLL up to 90 mm suspected pneumonia managing antibiotics x5 days will need CT follow-up in 2 to 3 weeks to ensure  resolution   GI bleeding- (present on admission) Episode of hematemesis likely from esophagitis/pseudoobstruction.  Seen by GI no evidence of active bleeding.CT abd/pelvis fluid filled bowel.  Hb normalized. Recent Labs  Lab 07/01/21 0534 07/02/21 0525 07/03/21 0502 07/04/21 0809 07/06/21 0529  HGB 12.3* 12.0* 11.9* 12.8* 13.5  HCT 38.6* 37.5* 36.3* 39.1 41.1    Angiomyolipoma of left kidney Outpatient follow-up.    Schizoaffective disorder, bipolar type ([place])- (present on admission) Mood stable.  Continue Seroquel and nightly Ambien.    Cardiomegaly- (present on admission) Seen in chest x-ray, echo with EF 70 to 75%,no RWMA, mild LVH   CKD (chronic kidney disease), stage III ([place])- (present on admission) Renal function overall remains stable as below Recent Labs  Lab 07/01/21 0534 07/02/21 0525 07/03/21 0502 07/04/21 0809 07/06/21 0529  BUN 18 18 16 18 15   CREATININE 1.50* 1.46* 1.51* 1.42* 1.33*     DVT prophylaxis: heparin injection 5,000 Units Start: 07/04/21 2200 Place and maintain sequential compression device Start: 07/03/21 2011 SCDs Start: 06/28/21 2236 Code Status:   Code Status: Full Code Family Communication: plan of care discussed with patient at bedside. Disposition: Currently not medically stable for discharge. Status is: Inpatient Remains inpatient appropriate because: For ongoing management of pseudoobstruction and evaluation for diet tolerance Planned Discharge Destination: Home with Home Health tomorrow if able to tolerate diet, awai GI clearance.  Objective: Vitals last 24 hrs: Vitals:   07/05/21 1427 07/05/21 2051 07/06/21 0418 07/06/21 0420  BP: (!) 148/100 (!) 165/98 (!) 158/89   Pulse: 84 71 74   Resp: 13 16 16    Temp: 98.3 F (36.8 C) 98.7 F (37.1 C) (!) 97.5 F (36.4 C)   TempSrc:  Oral Oral   SpO2: 99% 97% 100%   Weight:    68.4 kg  Height:       Weight change: -0.4 kg  Physical Examination: General exam: AA, oriented  follows some commands older than stated age, weak appearing. HEENT:Oral mucosa moist, Ear/Nose WNL grossly, dentition normal. Respiratory system: bilaterally diminished, no use of accessory muscle Cardiovascular system: S1 & S2 +, No JVD,. Gastrointestinal system: Abdomen soft, moderately distended, nontender. , BS+ Nervous System:Alert, awake, moving extremities and grossly nonfocal Extremities: LE ankle edema NEG, distal peripheral pulses palpable.  Skin: No rashes,no icterus. MSK: Normal muscle bulk,tone, power    Medications reviewed:  Scheduled Meds:  chlorhexidine  15 mL Mouth Rinse BID   divalproex  1,000 mg Oral QHS   haloperidol  20 mg Oral TID   heparin injection (subcutaneous)  5,000 Units Subcutaneous Q8H   lithium carbonate  150 mg Oral Daily   lithium carbonate  300 mg Oral QHS   mouth rinse  15 mL Mouth Rinse q12n4p   pantoprazole  40 mg Oral Daily   polyethylene glycol  17 g Oral TID   propranolol  10 mg Oral TID   QUEtiapine  400 mg Oral BID   senna-docusate  2 tablet Oral BID   sodium chloride flush  3 mL Intravenous Q12H   zolpidem  10 mg Oral QHS   Continuous Infusions:  lactated ringers with kcl 75 mL/hr at 07/06/21 1021      Diet Order             Diet full liquid Room service appropriate? Yes; Fluid consistency: Thin  Diet effective now                            Intake/Output Summary (Last 24 hours) at 07/06/2021 1130 Last data filed at 07/06/2021 0914 Gross per 24 hour  Intake 2413.74 ml  Output 425 ml  Net 1988.74 ml   Net IO Since Admission: 363.32 mL [07/06/21 1130]  Wt Readings from Last 3 Encounters:  07/06/21 68.4 kg  04/28/21 64 kg  04/20/21 64 kg     Unresulted Labs (From admission, onward)    None     Data Reviewed: I have personally reviewed following labs and imaging studies CBC: Recent Labs  Lab 06/30/21 0512 07/01/21 0534 07/02/21 0525 07/03/21 0502 07/04/21 0809 07/06/21 0529  WBC 10.6* 7.2 6.7 6.9 5.7  6.2  NEUTROABS 7.7 4.7 4.0 3.6 3.3  --   HGB 12.6* 12.3* 12.0* 11.9* 12.8* 13.5  HCT 39.9 38.6* 37.5* 36.3* 39.1 41.1  MCV 93.4 92.3 93.3 91.2 90.1 90.9  PLT 205 176 175 PLATELET CLUMPS NOTED ON SMEAR, UNABLE TO ESTIMATE 276 010   Basic Metabolic Panel: Recent Labs  Lab 06/30/21 0512 07/01/21 0534 07/02/21 0525 07/03/21 0502 07/04/21 0809 07/06/21 0529  NA 141 141 140 143 141 137  K 4.2 3.9 4.1 4.6 4.6 4.6  CL 104 106 107 109 108 105  CO2 27 25 23 25 23 25   GLUCOSE 111* 93 86 78 80 85  BUN 15 18 18 16 18 15   CREATININE 1.44* 1.50* 1.46* 1.51* 1.42* 1.33*  CALCIUM 8.8* 8.7* 8.6* 8.7* 9.0 8.9  MG 2.5* 2.4 2.3 2.3  --   --   PHOS 4.3 4.7* 4.2 4.9*  --   --    GFR: Estimated Creatinine Clearance: 52 mL/min (A) (by C-G formula based on SCr of 1.33 mg/dL (H)). Liver  Function Tests: Recent Labs  Lab 06/30/21 0512 07/01/21 0534 07/02/21 0525 07/03/21 0502 07/04/21 0809  AST 20 16 17 20 25   ALT 13 12 11 11 14   ALKPHOS 112 92 89 85 91  BILITOT 0.6 0.6 0.5 0.6 0.6  PROT 7.3 6.6 6.5 6.6 7.1  ALBUMIN 3.6 3.2* 3.2* 2.9* 3.3*   No results for input(s): LIPASE, AMYLASE in the last 168 hours.  Recent Labs  Lab 07/03/21 0502  AMMONIA 40*   Coagulation Profile: No results for input(s): INR, PROTIME in the last 168 hours.  Cardiac Enzymes: No results for input(s): CKTOTAL, CKMB, CKMBINDEX, TROPONINI in the last 168 hours. BNP (last 3 results) No results for input(s): PROBNP in the last 8760 hours. HbA1C: No results for input(s): HGBA1C in the last 72 hours. CBG: Recent Labs  Lab 07/02/21 2344 07/03/21 0557  GLUCAP 90 71   Lipid Profile: No results for input(s): CHOL, HDL, LDLCALC, TRIG, CHOLHDL, LDLDIRECT in the last 72 hours. Thyroid Function Tests: No results for input(s): TSH, T4TOTAL, FREET4, T3FREE, THYROIDAB in the last 72 hours. Anemia Panel: No results for input(s): VITAMINB12, FOLATE, FERRITIN, TIBC, IRON, RETICCTPCT in the last 72 hours. Sepsis Labs: No  results for input(s): PROCALCITON, LATICACIDVEN in the last 168 hours.   Recent Results (from the past 240 hour(s))  Resp Panel by RT-PCR (Flu A&B, Covid) Nasopharyngeal Swab     Status: None   Collection Time: 06/28/21  8:03 PM   Specimen: Nasopharyngeal Swab; Nasopharyngeal(NP) swabs in vial transport medium  Result Value Ref Range Status   SARS Coronavirus 2 by RT PCR NEGATIVE NEGATIVE Final    Comment: (NOTE) SARS-CoV-2 target nucleic acids are NOT DETECTED.  The SARS-CoV-2 RNA is generally detectable in upper respiratory specimens during the acute phase of infection. The lowest concentration of SARS-CoV-2 viral copies this assay can detect is 138 copies/mL. A negative result does not preclude SARS-Cov-2 infection and should not be used as the sole basis for treatment or other patient management decisions. A negative result may occur with  improper specimen collection/handling, submission of specimen other than nasopharyngeal swab, presence of viral mutation(s) within the areas targeted by this assay, and inadequate number of viral copies(<138 copies/mL). A negative result must be combined with clinical observations, patient history, and epidemiological information. The expected result is Negative.  Fact Sheet for Patients:  EntrepreneurPulse.com.au  Fact Sheet for Healthcare Providers:  IncredibleEmployment.be  This test is no t yet approved or cleared by the [place] FDA and  has been authorized for detection and/or diagnosis of SARS-CoV-2 by FDA under an Emergency Use Authorization (EUA). This EUA will remain  in effect (meaning this test can be used) for the duration of the COVID-19 declaration under Section 564(b)(1) of the Act, 21 U.S.C.section 360bbb-3(b)(1), unless the authorization is terminated  or revoked sooner.       Influenza A by PCR NEGATIVE NEGATIVE Final   Influenza B by PCR NEGATIVE NEGATIVE Final    Comment:  (NOTE) The Xpert Xpress SARS-CoV-2/FLU/RSV plus assay is intended as an aid in the diagnosis of influenza from Nasopharyngeal swab specimens and should not be used as a sole basis for treatment. Nasal washings and aspirates are unacceptable for Xpert Xpress SARS-CoV-2/FLU/RSV testing.  Fact Sheet for Patients: EntrepreneurPulse.com.au  Fact Sheet for Healthcare Providers: IncredibleEmployment.be  This test is not yet approved or cleared by the [place] FDA and has been authorized for detection and/or diagnosis of SARS-CoV-2 by FDA under an Emergency Use Authorization (  EUA). This EUA will remain in effect (meaning this test can be used) for the duration of the COVID-19 declaration under Section 564(b)(1) of the Act, 21 U.S.C. section 360bbb-3(b)(1), unless the authorization is terminated or revoked.  Performed at [facility], [place] [name], [place], [place] [address]     Antimicrobials: Anti-infectives (From admission, onward)    Start     Dose/Rate Route Frequency Ordered Stop   06/28/21 2245  cefTRIAXone (ROCEPHIN) 2 g in sodium chloride 0.9 % 100 mL IVPB        2 g 200 mL/hr over 30 Minutes Intravenous Every 24 hours 06/28/21 2238 07/02/21 2323   06/28/21 2245  azithromycin (ZITHROMAX) 500 mg in sodium chloride 0.9 % 250 mL IVPB        500 mg 250 mL/hr over 60 Minutes Intravenous Every 24 hours 06/28/21 2238 07/03/21 0034      Culture/Microbiology No results found for: SDES, SPECREQUEST, CULT, REPTSTATUS  Other culture-see note  Radiology Studies: DG Abd 1 View  Result Date: 07/05/2021 CLINICAL DATA:  Abdominal distension this morning EXAM: ABDOMEN - 1 VIEW COMPARISON:  07/02/2021 FINDINGS: Diffuse gaseous distension of the colon is similar to prior examinations. Multiple surgical clips again seen throughout the left abdomen. Nasogastric tube is no longer identified. IMPRESSION: Unchanged dilatation of the colon  most likely due to ileus. Electronically Signed   By: Miachel Roux M.D.   On: 07/05/2021 08:15     LOS: 8 days   Antonieta Pert, MD [facility]  07/06/2021, 11:30 AM

## 2021-07-06 NOTE — Progress Notes (Signed)
Physical Therapy Treatment Patient Details Name: Nicholas Oconnell MRN: [id number] DOB: 1960/07/08 Today's Date: 07/06/2021   History of Present Illness Pt admitted from group home on 06/28/21 2* lethargy, AMS, and comitting and dx with AMS, PNA, GIB, and acute pseudo-obstruction of bowel.  Pt with hx of bipolar, seizure, CKD, cardiomegaly, schizoaffective disorder, and Ogilvie syndrome    PT Comments    General Comments: Hx cognitive impairements, pleasant, flirty, sweet.  Following all commands.  Some speech difficult to understand.  Assisted OOB to amb in hallway. General bed mobility comments: improved cognition to follow commands and self assist.  Some rigidity and freezing.  Delayed motor control. General transfer comment: Min Assist needed for rigidity, ataxia and delayed motor responce.  Unsteady.  HIGH FALL RISK. General Gait Details: unsteady, rigid, poor forward flex posture with tendancy to push walker too far front.  Assistance to navigate walker around obsticles and thru [place].  Some festination.  HIGH FALL RISK.  Would NOT rec he walk on his own. Pt resides at a [facility].  Not sure what his prior level of mobility was.  Pt plans to return.     Recommendations for follow up therapy are one component of a multi-disciplinary discharge planning process, led by the attending physician.  Recommendations may be updated based on patient status, additional functional criteria and insurance authorization.  Follow Up Recommendations  Home health PT     Assistance Recommended at Discharge Frequent or constant Supervision/Assistance  Patient can return home with the following A little help with walking and/or transfers;A little help with bathing/dressing/bathroom;Assistance with cooking/housework;Assist for transportation;Help with stairs or ramp for entrance;Direct supervision/assist for medications management;Direct supervision/assist for financial management   Equipment Recommendations   None recommended by PT    Recommendations for Other Services       Precautions / Restrictions Precautions Precautions: Fall Precaution Comments: Hx Cognitive deficits     Mobility  Bed Mobility Overal bed mobility: Needs Assistance Bed Mobility: Supine to Sit, Sit to Supine     Supine to sit: Min assist Sit to supine: Min assist   General bed mobility comments: improved cognition to follow commands and self assist.  Some rigidity and freezing.  Delayed motor control.    Transfers Overall transfer level: Needs assistance Equipment used: Rolling walker (2 wheels) Transfers: Sit to/from Stand Sit to Stand: Min assist           General transfer comment: Min Assist needed for rigidity, ataxia and delayed motor responce.  Unsteady.  HIGH FALL RISK.    Ambulation/Gait Ambulation/Gait assistance: Min assist, Mod assist Gait Distance (Feet): 185 Feet Assistive device: Rolling walker (2 wheels) Gait Pattern/deviations: Step-to pattern, Decreased stride length, Shuffle Gait velocity: decreased     General Gait Details: unsteady, rigid, poor forward flex posture with tendancy to push walker too far front.  Assistance to navigate walker around obsticles and thru [place].  Some festination.  HIGH FALL RISK.  Would NOT rec he walk on his own.   Stairs             Wheelchair Mobility    Modified Rankin (Stroke Patients Only)       Balance                                            Cognition Arousal/Alertness: Awake/alert Behavior During Therapy: Impulsive Overall Cognitive Status: Within  Functional Limits for tasks assessed                                 General Comments: Hx cognitive impairements, pleasant, flirty, sweet        Exercises      General Comments        Pertinent Vitals/Pain Pain Assessment Pain Assessment: No/denies pain    Home Living                          Prior Function             PT Goals (current goals can now be found in the care plan section) Progress towards PT goals: Progressing toward goals    Frequency    Min 3X/week      PT Plan Current plan remains appropriate    Co-evaluation              AM-PAC PT "6 Clicks" Mobility   Outcome Measure  Help needed turning from your back to your side while in a flat bed without using bedrails?: A Little Help needed moving from lying on your back to sitting on the side of a flat bed without using bedrails?: A Little Help needed moving to and from a bed to a chair (including a wheelchair)?: A Little Help needed standing up from a chair using your arms (e.g., wheelchair or bedside chair)?: A Little Help needed to walk in hospital room?: A Lot Help needed climbing 3-5 steps with a railing? : A Lot 6 Click Score: 16    End of Session Equipment Utilized During Treatment: Gait belt Activity Tolerance: Patient tolerated treatment well Patient left: in bed;with call bell/phone within reach;with bed alarm set Nurse Communication: Mobility status PT Visit Diagnosis: Unsteadiness on feet (R26.81);Difficulty in walking, not elsewhere classified (R26.2)     Time: 8756-4332 PT Time Calculation (min) (ACUTE ONLY): 13 min  Charges:  $Gait Training: 8-22 mins                     {Yocheved Depner  PTA Acute  Rehabilitation Services Pager      [phone] Office      [phone]

## 2021-07-07 MED ORDER — BISACODYL 10 MG RE SUPP
10.0000 mg | Freq: Once | RECTAL | Status: AC
  Administered 2021-07-07: 10 mg via RECTAL
  Filled 2021-07-07: qty 1

## 2021-07-07 NOTE — Progress Notes (Signed)
Speech Language Pathology Treatment: Dysphagia  Patient Details Name: Nicholas Oconnell MRN: [id number] DOB: Aug 07, 1960 Today's Date: 07/07/2021 Time: 4098-1191 SLP Time Calculation (min) (ACUTE ONLY): 10 min  Assessment / Plan / Recommendation Clinical Impression  Today follow up to address dysphagia goals - pt required assist for bed repositioning upright - He was able to self feed ice cream, graham cracker and drink liquids without oral retention nor delays in swallowing.  No overt /s of aspiration - continued occasional wet voice with po however.  At this time, given pt has tolerating po without negative respiratory changes - recommend advance diet per GI to soft as indicated. No SLP follow up indicated as pt has met his dysphagia goals and manages po well at this time without specific strategies.  Advise pt to continue to remain upright after po intake given his h/o fluid-filled esophagus on prior CT imaging.  Ordered pt's dinner for him and posted new swallow precautions.  No SLP follow up. Thanks for allowing this SLP to help with pt's care plan.    HPI HPI: Pt admitted from group home on 06/28/21 2* lethargy, AMS, and comitting and dx with AMS, PNA, GIB, and acute pseudo-obstruction of bowel.  Pt with hx of bipolar, seizure, CKD, cardiomegaly, schizoaffective disorder, and Ogilvie syndrome.  He required NG tube placement - which he pulled out.  GI following - Order for swallow eval received from hospitalist.  GI following pt and advised advance diet to soft/thin.  Follow up x1 to assure advancement tolerance given pt's wet vocal quality.      SLP Plan  Discharge SLP treatment due to (comment);All goals met      Recommendations for follow up therapy are one component of a multi-disciplinary discharge planning process, led by the attending physician.  Recommendations may be updated based on patient status, additional functional criteria and insurance authorization.    Recommendations  Diet  recommendations: Thin liquid;Regular (soft foods given edentulous) Liquids provided via: Cup;Straw Medication Administration: Other (Comment) (as tolerated) Supervision: Patient able to self feed Compensations: Slow rate;Small sips/bites Postural Changes and/or Swallow Maneuvers: Seated upright 90 degrees;Upright 30-60 min after meal                Oral Care Recommendations: Oral care BID Follow Up Recommendations: No SLP follow up SLP Visit Diagnosis: Dysphagia, oral phase (R13.11) Plan: Discharge SLP treatment due to (comment);All goals met        Kathleen Lime, MS [facility] SLP Acute Rehab Services Office [phone] Cell [place], [place]  07/07/2021, 5:58 PM

## 2021-07-07 NOTE — Progress Notes (Signed)
°   07/07/21 1400  Mobility  Activity Transferred from bed to chair  Level of Assistance Minimal assist, patient does 75% or more  Assistive Device None  Activity Response Tolerated well  $Mobility charge 1 Mobility   RN stated pt just had full clean after fecal incident. Asked pt if he would like to get to the chair. Pt agreeable. Assisted pt to chair. Left pt in chair with call bell at side and RN present.   [place] Specialist Acute Rehab Services Office: [phone]

## 2021-07-07 NOTE — Progress Notes (Signed)
PROGRESS NOTE Zaron Zwiefelhofer  IWL:[id number] DOB: [birth year] DOA: 06/28/2021 PCP: Neale Burly, MD   Brief Narrative/Hospital Course: Darryl Nestle, 61 y.o. male 61 year old male with medical history significant for schizoaffective disorder, CKD stage III, hypertension, Ogilvie syndrome, history of partial colectomy, now presenting to the emergency department with lethargy and hematemesis.  CT scan was concerning for enterocolitis.  GI was consulted.  He's been started on PPI and continued on his home bowel regimen.  S/p NG tube.  Currently working on gradually advancing with support of GI. X-ray shows persistent bowel dilatation but patient continued to have bowel movement, diet is being advanced slowly.  At this time patient diet is being advanced as per GI tolerating well.  Has been ambulating.  PT OT has recommended home health PT OT.  Diet advanced to soft diet 2/3, nursing reports he is having small amount of loose stool    Subjective: Seen and examined.  Alert awake with baseline cognitive mental status impairment Nurses report he had liquidy stool no actual BM. Giving suppository this morning Abdomen remains distended but unchanged  Assessment and Plan: * Acute pseudo-obstruction of bowel- (present on admission) History of Ogilvie's syndrome and partial colectomy.  Off NGT.  Being managed conservatively. x-ray abdomen 2/1 unchanged dilatation of the colon mostly due to ileus.  GI signed off.  added dysphagia 3 diet 2/3.  Having loose stool per nursing without actual BM.  Add rectal suppository x1 continue on aggressive MiraLAX 3 times daily, stool softener.  Once he has a good bowel movement we can discharge him to group home.   Aspiration pneumonitis [facility]) Patient completed antibiotics.     Acute encephalopathy- (present on admission) Was lethargic on admission, but now resolved. W/ cognitive impairment and psychiatric illness at baseline, work-up unyielding with slightly  elevated ammonia, normal level-TSH B12, RPR, lithium level, valproic acid level.  Continue home Seroquel Depakote lithium.     Pneumonia- (present on admission) Imaging showed nodular airspace disease in RLL up to 90 mm suspected pneumonia managing antibiotics x5 days will need CT follow-up in 2 to 3 weeks to ensure resolution   GI bleeding- (present on admission) Episode of hematemesis likely from esophagitis/pseudoobstruction.  Seen by GI no evidence of active bleeding.CT abd/pelvis 1/25-fluid filled bowel, dilated.Hb normalized. Recent Labs  Lab 07/01/21 0534 07/02/21 0525 07/03/21 0502 07/04/21 0809 07/06/21 0529  HGB 12.3* 12.0* 11.9* 12.8* 13.5  HCT 38.6* 37.5* 36.3* 39.1 41.1    Angiomyolipoma of left kidney Outpatient follow-up.    Schizoaffective disorder, bipolar type ([place])- (present on admission) Mood stable.  Continue Seroquel and nightly Ambien.    Cardiomegaly- (present on admission) Seen in chest x-ray, echo with EF 70 to 75%,no RWMA, mild LVH   CKD (chronic kidney disease), stage III ([place])- (present on admission) Renal function overall stable as below Recent Labs  Lab 07/01/21 0534 07/02/21 0525 07/03/21 0502 07/04/21 0809 07/06/21 0529  BUN 18 18 16 18 15   CREATININE 1.50* 1.46* 1.51* 1.42* 1.33*    DVT prophylaxis: heparin injection 5,000 Units Start: 07/04/21 2200 Place and maintain sequential compression device Start: 07/03/21 2011 SCDs Start: 06/28/21 2236 Code Status:   Code Status: Full Code Family Communication: plan of care discussed with patient and RN, TOC  Disposition: Currently not medically stable for discharge. Status is: Inpatient Remains inpatient appropriate because: For ongoing management of pseudoobstruction and evaluation for diet tolerance Planned Discharge Destination: Group home with Home with Home Health once he has a bowel movement and  tolerating his dysphagia 3 diet  Objective: Vitals last 24 hrs: Vitals:   07/06/21  1530 07/06/21 1928 07/07/21 0418 07/07/21 0509  BP: (!) 167/99 (!) 119/92 140/89   Pulse: 81 73 72   Resp: 14 18 18    Temp: 97.9 F (36.6 C) 97.7 F (36.5 C) 97.7 F (36.5 C)   TempSrc: Oral Oral Oral   SpO2: 100% 100% 100%   Weight:    68.3 kg  Height:       Weight change: -0.1 kg  Physical Examination: General exam: Aa0x1-2, baseline currently impairment, pleasant HEENT:Oral mucosa moist, Ear/Nose WNL grossly, dentition normal. Respiratory system: bilaterally diminished, no use of accessory muscle Cardiovascular system: S1 & S2 +, No JVD,. Gastrointestinal system: Abdomen soft, moderate distended, nontender,BS+ Nervous System:Alert, awake, moving extremities and grossly nonfocal Extremities: LE ankle edema none, distal peripheral pulses palpable.  Skin: No rashes,no icterus. MSK: Normal muscle bulk,tone, power   Medications reviewed:  Scheduled Meds:  bisacodyl  10 mg Rectal Once   chlorhexidine  15 mL Mouth Rinse BID   divalproex  1,000 mg Oral QHS   haloperidol  20 mg Oral TID   heparin injection (subcutaneous)  5,000 Units Subcutaneous Q8H   lithium carbonate  150 mg Oral Daily   lithium carbonate  300 mg Oral QHS   mouth rinse  15 mL Mouth Rinse q12n4p   pantoprazole  40 mg Oral Daily   polyethylene glycol  17 g Oral TID   propranolol  10 mg Oral TID   QUEtiapine  400 mg Oral BID   senna-docusate  2 tablet Oral BID   sodium chloride flush  3 mL Intravenous Q12H   zolpidem  10 mg Oral QHS   Continuous Infusions:  lactated ringers with kcl 75 mL/hr at 07/06/21 2339      Diet Order             DIET DYS 3 Room service appropriate? Yes; Fluid consistency: Thin  Diet effective now                   Intake/Output Summary (Last 24 hours) at 07/07/2021 1049 Last data filed at 07/07/2021 0300 Gross per 24 hour  Intake 1223 ml  Output 1750 ml  Net -527 ml   Net IO Since Admission: -163.68 mL [07/07/21 1049]  Wt Readings from Last 3 Encounters:  07/07/21  68.3 kg  04/28/21 64 kg  04/20/21 64 kg     Unresulted Labs (From admission, onward)    None     Data Reviewed: I have personally reviewed following labs and imaging studies CBC: Recent Labs  Lab 07/01/21 0534 07/02/21 0525 07/03/21 0502 07/04/21 0809 07/06/21 0529  WBC 7.2 6.7 6.9 5.7 6.2  NEUTROABS 4.7 4.0 3.6 3.3  --   HGB 12.3* 12.0* 11.9* 12.8* 13.5  HCT 38.6* 37.5* 36.3* 39.1 41.1  MCV 92.3 93.3 91.2 90.1 90.9  PLT 176 175 PLATELET CLUMPS NOTED ON SMEAR, UNABLE TO ESTIMATE 276 696   Basic Metabolic Panel: Recent Labs  Lab 07/01/21 0534 07/02/21 0525 07/03/21 0502 07/04/21 0809 07/06/21 0529  NA 141 140 143 141 137  K 3.9 4.1 4.6 4.6 4.6  CL 106 107 109 108 105  CO2 25 23 25 23 25   GLUCOSE 93 86 78 80 85  BUN 18 18 16 18 15   CREATININE 1.50* 1.46* 1.51* 1.42* 1.33*  CALCIUM 8.7* 8.6* 8.7* 9.0 8.9  MG 2.4 2.3 2.3  --   --  PHOS 4.7* 4.2 4.9*  --   --    GFR: Estimated Creatinine Clearance: 52 mL/min (A) (by C-G formula based on SCr of 1.33 mg/dL (H)). Liver Function Tests: Recent Labs  Lab 07/01/21 0534 07/02/21 0525 07/03/21 0502 07/04/21 0809  AST 16 17 20 25   ALT 12 11 11 14   ALKPHOS 92 89 85 91  BILITOT 0.6 0.5 0.6 0.6  PROT 6.6 6.5 6.6 7.1  ALBUMIN 3.2* 3.2* 2.9* 3.3*   No results for input(s): LIPASE, AMYLASE in the last 168 hours.  Recent Labs  Lab 07/03/21 0502  AMMONIA 40*   Coagulation Profile: No results for input(s): INR, PROTIME in the last 168 hours.  Cardiac Enzymes: No results for input(s): CKTOTAL, CKMB, CKMBINDEX, TROPONINI in the last 168 hours. BNP (last 3 results) No results for input(s): PROBNP in the last 8760 hours. HbA1C: No results for input(s): HGBA1C in the last 72 hours. CBG: Recent Labs  Lab 07/02/21 2344 07/03/21 0557  GLUCAP 90 71   Lipid Profile: No results for input(s): CHOL, HDL, LDLCALC, TRIG, CHOLHDL, LDLDIRECT in the last 72 hours. Thyroid Function Tests: No results for input(s): TSH,  T4TOTAL, FREET4, T3FREE, THYROIDAB in the last 72 hours. Anemia Panel: No results for input(s): VITAMINB12, FOLATE, FERRITIN, TIBC, IRON, RETICCTPCT in the last 72 hours. Sepsis Labs: No results for input(s): PROCALCITON, LATICACIDVEN in the last 168 hours.   Recent Results (from the past 240 hour(s))  Resp Panel by RT-PCR (Flu A&B, Covid) Nasopharyngeal Swab     Status: None   Collection Time: 06/28/21  8:03 PM   Specimen: Nasopharyngeal Swab; Nasopharyngeal(NP) swabs in vial transport medium  Result Value Ref Range Status   SARS Coronavirus 2 by RT PCR NEGATIVE NEGATIVE Final    Comment: (NOTE) SARS-CoV-2 target nucleic acids are NOT DETECTED.  The SARS-CoV-2 RNA is generally detectable in upper respiratory specimens during the acute phase of infection. The lowest concentration of SARS-CoV-2 viral copies this assay can detect is 138 copies/mL. A negative result does not preclude SARS-Cov-2 infection and should not be used as the sole basis for treatment or other patient management decisions. A negative result may occur with  improper specimen collection/handling, submission of specimen other than nasopharyngeal swab, presence of viral mutation(s) within the areas targeted by this assay, and inadequate number of viral copies(<138 copies/mL). A negative result must be combined with clinical observations, patient history, and epidemiological information. The expected result is Negative.  Fact Sheet for Patients:  EntrepreneurPulse.com.au  Fact Sheet for Healthcare Providers:  IncredibleEmployment.be  This test is no t yet approved or cleared by the [place] FDA and  has been authorized for detection and/or diagnosis of SARS-CoV-2 by FDA under an Emergency Use Authorization (EUA). This EUA will remain  in effect (meaning this test can be used) for the duration of the COVID-19 declaration under Section 564(b)(1) of the Act, 21 U.S.C.section  360bbb-3(b)(1), unless the authorization is terminated  or revoked sooner.       Influenza A by PCR NEGATIVE NEGATIVE Final   Influenza B by PCR NEGATIVE NEGATIVE Final    Comment: (NOTE) The Xpert Xpress SARS-CoV-2/FLU/RSV plus assay is intended as an aid in the diagnosis of influenza from Nasopharyngeal swab specimens and should not be used as a sole basis for treatment. Nasal washings and aspirates are unacceptable for Xpert Xpress SARS-CoV-2/FLU/RSV testing.  Fact Sheet for Patients: EntrepreneurPulse.com.au  Fact Sheet for Healthcare Providers: IncredibleEmployment.be  This test is not yet approved or cleared  by the [place] and has been authorized for detection and/or diagnosis of SARS-CoV-2 by FDA under an Emergency Use Authorization (EUA). This EUA will remain in effect (meaning this test can be used) for the duration of the COVID-19 declaration under Section 564(b)(1) of the Act, 21 U.S.C. section 360bbb-3(b)(1), unless the authorization is terminated or revoked.  Performed at [facility], [place] [address] [place], [place] [address]     Antimicrobials: Anti-infectives (From admission, onward)    Start     Dose/Rate Route Frequency Ordered Stop   06/28/21 2245  cefTRIAXone (ROCEPHIN) 2 g in sodium chloride 0.9 % 100 mL IVPB        2 g 200 mL/hr over 30 Minutes Intravenous Every 24 hours 06/28/21 2238 07/02/21 2323   06/28/21 2245  azithromycin (ZITHROMAX) 500 mg in sodium chloride 0.9 % 250 mL IVPB        500 mg 250 mL/hr over 60 Minutes Intravenous Every 24 hours 06/28/21 2238 07/03/21 0034      Culture/Microbiology No results found for: SDES, SPECREQUEST, CULT, REPTSTATUS  Other culture-see note  Radiology Studies: No results found.   LOS: 9 days   Antonieta Pert, MD [facility]  07/07/2021, 10:49 AM

## 2021-07-08 NOTE — Discharge Summary (Signed)
Physician Discharge Summary   Patient: Nicholas Oconnell MRN: [id number] DOB: May 03, 1961  Admit date:     06/28/2021  Discharge date: 07/08/21  Discharge Physician: Mckinley Jewel   PCP: Neale Burly, MD   Recommendations at discharge:  Follow-up with PCP in 1 week Follow-up with GI as needed GI soft diet  Discharge Diagnoses: -Acute pseudoobstruction of bowel-resolved -Aspiration pneumonitis-resolved -Acute encephalopathy -Pneumonia-resolved -GI bleeding -Angiomyolipoma of left kidney -Schizoaffective disorder, bipolar type -Cardiomegaly -Chronic kidney disease stage III   Hospital Course: 61 year old male with medical history significant for schizoaffective disorder, CKD stage III, hypertension, Ogilvie syndrome, history of partial colectomy, now presenting to the emergency department with lethargy and hematemesis.  CT scan was concerning for enterocolitis.  GI was consulted.  He's been started on PPI and continued on his home bowel regimen.  S/p NG tube.  Currently working on gradually advancing with support of GI. X-ray shows persistent bowel dilatation but patient continued to have bowel movement, diet is being advanced slowly. Has been ambulating.  PT OT has recommended home health PT OT.  Diet advanced to soft diet 2/3 out any issues.  Patient had large bowel movement x3 since last night.  Stable for discharge today.  Assessment and Plan:  Acute pseudo-obstruction of bowel- (present on admission) -History of Ogilvie's syndrome and partial colectomy.  Off NGT.  Being managed conservatively. x-ray abdomen 2/1 unchanged dilatation of the colon mostly due to ileus.  GI signed off.  added dysphagia 3 diet 2/3.   -continued on aggressive MiraLAX 3 times daily, stool softener.   -He had large 3 bowel movement last night -Discharged to group home in stable condition.    Aspiration pneumonitis ([place]) -Patient completed antibiotics.  Maintaining oxygen saturation on room air.     Acute encephalopathy- (present on admission) -Was lethargic on admission, but now resolved. W/ cognitive impairment and psychiatric illness at baseline, work-up unyielding with slightly elevated ammonia, normal level-TSH B12, RPR, lithium level, valproic acid level.  Continue dhome Seroquel, Depakote, lithium.     Pneumonia- (present on admission) -Imaging showed nodular airspace disease in RLL up to 90 mm suspected pneumonia -received IV antibiotics for 5 days.   GI bleeding- (present on admission) -Episode of hematemesis likely from esophagitis/pseudoobstruction.  Seen by GI no evidence of active bleeding.CT abd/pelvis 1/25-fluid filled bowel, dilated.Hb normalized.  Angiomyolipoma of left kidney -Function within normal limits.  Outpatient follow-up.     Schizoaffective disorder, bipolar type ([place])- (present on admission) -Mood remained stable.  Continued home medicine-Seroquel and nightly Ambien.    Cardiomegaly- (present on admission) -Seen in chest x-ray, echo with EF 70 to 75%,no RWMA, mild LVH -Euvolemic on exam.  No signs of fluid overload.   CKD (chronic kidney disease), stage III ([place])- (present on admission) -Renal function improved.  Last creatinine: 1.33, GFR more than 60.  Consultants: GI Procedures performed: None Disposition: Group home Diet recommendation: GI soft diet  DISCHARGE MEDICATION: Allergies as of 07/08/2021       Reactions   Benzodiazepines Other (See Comments)   disinhibition   Clozapine Other (See Comments)   constipation - bowel obstruction        Medication List     TAKE these medications    divalproex 500 MG 24 hr tablet Commonly known as: Depakote ER Take 2 tablets (1,000 mg total) by mouth at bedtime.   Ensure Take by mouth 2 (two) times daily as needed (if 50% of meal not consumed).   Gas Relief Extra  Strength 125 MG chewable tablet Generic drug: simethicone Chew 125 mg by mouth every 6 (six) hours as needed for flatulence.    haloperidol 20 MG tablet Commonly known as: HALDOL Take 20 mg by mouth 3 (three) times daily.   lactulose 10 GM/15ML solution Commonly known as: CHRONULAC Take 30 mLs by mouth 3 (three) times daily as needed for constipation.   lithium carbonate 150 MG capsule Take 150-300 mg by mouth See admin instructions. Take one capsule (150 mg) by mouth every morning and 2 capsules (300 mg) at bedtime   Melatonin 10 MG Caps Take 10 mg by mouth at bedtime.   metoprolol succinate 25 MG 24 hr tablet Commonly known as: TOPROL-XL Take 25 mg by mouth every morning.   pantoprazole 40 MG tablet Commonly known as: PROTONIX Take 40 mg by mouth 2 (two) times daily.   polyethylene glycol 17 g packet Commonly known as: MIRALAX / GLYCOLAX Take 17 g by mouth 2 (two) times daily.   propranolol 10 MG tablet Commonly known as: INDERAL Take 10 mg by mouth 3 (three) times daily.   QUEtiapine 400 MG tablet Commonly known as: SEROQUEL Take 400 mg by mouth 2 (two) times daily.   senna-docusate 8.6-50 MG tablet Commonly known as: Senokot-S Take 2 tablets by mouth every morning.   zolpidem 10 MG tablet Commonly known as: AMBIEN Take 10 mg by mouth at bedtime.        Follow-up Information     Neale Burly, MD Follow up in 1 week(s).   Specialty: Family Medicine Contact information: [place] [address] [phone]                 Discharge Exam: Danley Danker Weights   07/05/21 0443 07/06/21 0420 07/07/21 0509  Weight: 68.8 kg 68.4 kg 68.3 kg    Condition at discharge: stable  The results of significant diagnostics from this hospitalization (including imaging, microbiology, ancillary and laboratory) are listed below for reference.   Imaging Studies: DG Abd 1 View  Result Date: 07/05/2021 CLINICAL DATA:  Abdominal distension this morning EXAM: ABDOMEN - 1 VIEW COMPARISON:  07/02/2021 FINDINGS: Diffuse gaseous distension of the colon is similar to prior  examinations. Multiple surgical clips again seen throughout the left abdomen. Nasogastric tube is no longer identified. IMPRESSION: Unchanged dilatation of the colon most likely due to ileus. Electronically Signed   By: Miachel Roux M.D.   On: 07/05/2021 08:15   DG Abd 1 View  Result Date: 07/02/2021 CLINICAL DATA:  Adynamic ileus EXAM: ABDOMEN - 1 VIEW COMPARISON:  Multiple priors including most recent radiograph June 21, 2021. FINDINGS: Nasogastric tube with tip and side port overlying the left upper quadrant. Left abdominal surgical clips. Increased gaseous dilation of large bowel with the colonic loop of bowel projecting over the mid abdomen now measuring 12 cm previously 9.5 cm. No gas in the rectal vault. Pelvic phleboliths. IMPRESSION: 1. Increased gaseous dilation of large bowel consistent with worsening ileus/obstruction. 2. Stable nasogastric tube. Electronically Signed   By: Dahlia Bailiff M.D.   On: 07/02/2021 09:46   DG Abd 1 View  Result Date: 07/01/2021 CLINICAL DATA:  Follow-up ileus EXAM: ABDOMEN - 1 VIEW COMPARISON:  June 30, 2021. FINDINGS: Nasogastric tube with tip and side port overlying the left upper quadrant. Left-sided abdominal surgical clips. Similar gaseous dilation large bowel with the transverse colon measuring up to 9.5 cm and no gas in the rectal vault. IMPRESSION: Similar gaseous dilation of large bowel  with no gas in the rectal wall. Stable nasogastric tube Electronically Signed   By: Dahlia Bailiff M.D.   On: 07/01/2021 08:25   DG Abd 1 View  Result Date: 06/30/2021 CLINICAL DATA:  Encounter for abdominal distention. EXAM: ABDOMEN - 1 VIEW COMPARISON:  KUB 04/20/2021 CT abdomen and pelvis 06/28/2021 FINDINGS: There is moderate stool within the rectum. Interval clearance of the stool previously seen within the descending colon on 04/20/2021 KUB. There is moderate distention of the ascending and transverse colon measuring up to approximately 10 cm in caliber, mildly  decreased from 11 cm on 04/20/2021. On prior CT there appeared to be postsurgical change of left hemicolectomy. There is again mild distention of loops of bowel within the left hemiabdomen, measuring up to approximately 4.3 cm. No portal venous gas or pneumatosis, noting the superior aspect of the liver is not imaged. No definite free air is seen. No acute skeletal abnormality. IMPRESSION: Similar to recent 06/28/2021 CT, air is seen within the ascending and transverse colon and mildly distended loops of small bowel within the left hemiabdomen. This may be secondary to ileus. Electronically Signed   By: Yvonne Kendall M.D.   On: 06/30/2021 11:24   CT Head Wo Contrast  Result Date: 06/28/2021 CLINICAL DATA:  Mental status change, unknown cause EXAM: CT HEAD WITHOUT CONTRAST TECHNIQUE: Contiguous axial images were obtained from the base of the skull through the vertex without intravenous contrast. RADIATION DOSE REDUCTION: This exam was performed according to the departmental dose-optimization program which includes automated exposure control, adjustment of the mA and/or kV according to patient size and/or use of iterative reconstruction technique. COMPARISON:  None. FINDINGS: Brain: No acute intracranial abnormality. Specifically, no hemorrhage, hydrocephalus, mass lesion, acute infarction, or significant intracranial injury. Vascular: No hyperdense vessel or unexpected calcification. Skull: No acute calvarial abnormality. Sinuses/Orbits: No acute findings Other: None IMPRESSION: No acute intracranial abnormality. Electronically Signed   By: Rolm Baptise M.D.   On: 06/28/2021 21:37   CT Abdomen Pelvis W Contrast  Result Date: 06/28/2021 CLINICAL DATA:  Acute abdominal pain. Lethargy. Episode of vomiting. EXAM: CT ABDOMEN AND PELVIS WITH CONTRAST TECHNIQUE: Multidetector CT imaging of the abdomen and pelvis was performed using the standard protocol following bolus administration of intravenous contrast.  RADIATION DOSE REDUCTION: This exam was performed according to the departmental dose-optimization program which includes automated exposure control, adjustment of the mA and/or kV according to patient size and/or use of iterative reconstruction technique. CONTRAST:  57mL OMNIPAQUE IOHEXOL 350 MG/ML SOLN COMPARISON:  None. FINDINGS: Lower chest: Dilated fluid-filled distal esophagus. Nodular airspace disease in the right lower lobe measures 19 mm, series 4, image 21. Hepatobiliary: No focal liver abnormality is seen. No gallstones, gallbladder wall thickening, or biliary dilatation. Pancreas: No pancreatic inflammation or evidence of mass. Slight prominence of the proximal pancreatic duct at 3 mm. Spleen: Normal in size without focal abnormality. Adrenals/Urinary Tract: Normal adrenal glands. Mild right renal scarring, query postsurgical change in the mid right kidney. No hydronephrosis. 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 No hydronephrosis. No visualized renal calculi. Mild anterior bladder wall thickening. Stomach/Bowel: Detailed bowel assessment is limited in the absence of enteric contrast and paucity of intra-abdominal fat. Dilated fluid-filled distal esophagus. The stomach is distended with fluid/ingested contents. Fluid-filled duodenum and small bowel. Proximal small bowel is mildly dilated. The distal small bowel is less dilated but also fluid-filled. Enteric sutures in the sigmoid colon with suspected left hemicolectomy.  There also enteric sutures about cecum. The ascending and transverse colon are fluid-filled, transverse colon is also gaseous distended. Mild diffuse bowel enhancement. Distal sigmoid colon and rectum are nondistended, equivocal wall thickening. There is no other significant small or large bowel wall thickening or inflammatory change. Appendix not visualized, presumably surgically absent. Scattered mesenteric surgical clips.  Vascular/Lymphatic: Normal caliber abdominal aorta. The portal vein is patent. There is no portal venous or mesenteric gas. 7 mm left external iliac node, not enlarged by size criteria. There are no enlarged lymph nodes in the abdomen or pelvis. Reproductive: Prostate is unremarkable. Other: No free air or ascites.  No abdominal wall hernia. Musculoskeletal: There are no acute or suspicious osseous abnormalities. Mild L5-S1 degenerative disc disease. Lower lumbar facet hypertrophy. IMPRESSION: 1. Diffusely fluid-filled bowel,. There is occasional fluid distension of the GI track including the distal esophagus and stomach, proximal small bowel, ascending and transverse colon. Additional areas of bowel are fluid-filled but less dilated. No discrete transition point to suggest obstruction. Overall findings are suspicious for generalized enterocolitis. 2. Nodular airspace disease in the right lower lobe measuring 19 mm. This is likely pneumonia, although CT follow-up is recommended after course of treatment to ensure resolution. This is not amenable to radiographic follow-up, as it was not seen on chest radiograph earlier today. 3. Mild anterior bladder wall thickening, recommend correlation with urinalysis to exclude urinary tract infection. 4. 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 Mild right renal scarring, query postsurgical change. Electronically Signed   By: Keith Rake M.D.   On: 06/28/2021 21:45   DG CHEST PORT 1 VIEW  Result Date: 07/03/2021 CLINICAL DATA:  Cough EXAM: PORTABLE CHEST 1 VIEW COMPARISON:  06/28/2021 chest radiograph. FINDINGS: Surgical clips in the left upper quadrant of the abdomen. Stable cardiomediastinal silhouette with normal heart size. No pneumothorax. No pleural effusion. Streaky consolidation at the right lung base is new. No pulmonary edema. IMPRESSION: New streaky right lung base consolidation, suspicious for pneumonia. Recommend follow-up post treatment chest  radiographs to resolution. Electronically Signed   By: Ilona Sorrel M.D.   On: 07/03/2021 14:07   DG Chest Port 1 View  Result Date: 06/28/2021 CLINICAL DATA:  Altered mental status EXAM: PORTABLE CHEST 1 VIEW COMPARISON:  04/20/2021 FINDINGS: Interval moderate to marked cardiomegaly. No pleural effusion, consolidation, or pneumothorax. Hazy gas collection in the left upper quadrant probably reflects dilated stomach. Probable air distension of esophagus. IMPRESSION: 1. Interval cardiomegaly which may be due to chamber enlargement or pericardial effusion 2. Hazy gas structure in the left upper quadrant suspected to represent air distended stomach, there is possible air distension of esophagus. Recommend dedicated abdominal radiographs Electronically Signed   By: Donavan Foil M.D.   On: 06/28/2021 20:50   DG Abd Portable 1V  Result Date: 06/30/2021 CLINICAL DATA:  NG tube placement. EXAM: PORTABLE ABDOMEN - 1 VIEW COMPARISON:  Abdominal x-ray 06/30/2021. FINDINGS: The tip of the nasogastric tube is in the mid/proximal stomach. Dilatation of the colon has not significantly changed. There surgical clips in the left abdomen. Lung bases are grossly clear. No acute fractures. IMPRESSION: 1. Nasogastric tube tip in the mid/proximal stomach. 2. Stable dilated colon. Electronically Signed   By: Ronney Asters M.D.   On: 06/30/2021 18:26   ECHOCARDIOGRAM COMPLETE  Result Date: 06/29/2021    ECHOCARDIOGRAM REPORT   Patient Name:   HARLEE PURSIFULL Date of Exam: 06/29/2021 Medical Rec #:  [id number]    Height:  66.0 in Accession #:    [id number]   Weight:       141.0 lb Date of Birth:  04/19/1961   BSA:          1.724 m Patient Age:    62 years     BP:           137/117 mmHg Patient Gender: M            HR:           85 bpm. Exam Location:  Inpatient Procedure: 2D Echo Indications:    Cardiomegaly  History:        Patient has no prior history of Echocardiogram examinations.                 Risk Factors:Hypertension.   Sonographer:    Arlyss Gandy Referring Phys: [id number] TIMOTHY S OPYD  Sonographer Comments: PLAX measurements at end of study due to patient condition. IMPRESSIONS  1. Left ventricular ejection fraction, by estimation, is 70 to 75%. The left ventricle has hyperdynamic function. The left ventricle has no regional wall motion abnormalities. There is mild left ventricular hypertrophy of the basal-septal segment. Left ventricular diastolic parameters were normal.  2. Right ventricular systolic function is normal. The right ventricular size is normal.  3. The mitral valve is normal in structure. Trivial mitral valve regurgitation. No evidence of mitral stenosis.  4. The aortic valve is normal in structure. Aortic valve regurgitation is not visualized. No aortic stenosis is present. Aortic valve area, by VTI measures 2.84 cm. Aortic valve mean gradient measures 6.0 mmHg. Aortic valve Vmax measures 1.62 m/s. FINDINGS  Left Ventricle: Left ventricular ejection fraction, by estimation, is 70 to 75%. The left ventricle has hyperdynamic function. The left ventricle has no regional wall motion abnormalities. The left ventricular internal cavity size was normal in size. There is mild left ventricular hypertrophy of the basal-septal segment. Left ventricular diastolic parameters were normal. Normal left ventricular filling pressure. Right Ventricle: The right ventricular size is normal. No increase in right ventricular wall thickness. Right ventricular systolic function is normal. Left Atrium: Left atrial size was normal in size. Right Atrium: Right atrial size was normal in size. Pericardium: There is no evidence of pericardial effusion. Mitral Valve: The mitral valve is normal in structure. Trivial mitral valve regurgitation. No evidence of mitral valve stenosis. Tricuspid Valve: The tricuspid valve is normal in structure. Tricuspid valve regurgitation is trivial. No evidence of tricuspid stenosis. Aortic Valve: The aortic  valve is normal in structure. Aortic valve regurgitation is not visualized. No aortic stenosis is present. Aortic valve mean gradient measures 6.0 mmHg. Aortic valve peak gradient measures 10.5 mmHg. Aortic valve area, by VTI measures 2.84 cm. Pulmonic Valve: The pulmonic valve was normal in structure. Pulmonic valve regurgitation is trivial. No evidence of pulmonic stenosis. Aorta: The aortic root is normal in size and structure. Venous: The inferior vena cava was not well visualized. IAS/Shunts: No atrial level shunt detected by color flow Doppler.  LEFT VENTRICLE PLAX 2D LVIDd:         3.40 cm   Diastology LVIDs:         2.10 cm   LV e' medial:    9.36 cm/s LV PW:         1.10 cm   LV E/e' medial:  7.9 LV IVS:        1.20 cm   LV e' lateral:   8.92 cm/s LVOT diam:  2.00 cm   LV E/e' lateral: 8.3 LV SV:         79 LV SV Index:   46 LVOT Area:     3.14 cm  RIGHT VENTRICLE RV Basal diam:  3.40 cm RV Mid diam:    3.50 cm RV S prime:     17.00 cm/s TAPSE (M-mode): 2.5 cm LEFT ATRIUM             Index        RIGHT ATRIUM           Index LA diam:        3.50 cm 2.03 cm/m   RA Area:     13.60 cm LA Vol (A2C):   56.7 ml 32.89 ml/m  RA Volume:   32.10 ml  18.62 ml/m LA Vol (A4C):   31.2 ml 18.10 ml/m LA Biplane Vol: 42.9 ml 24.89 ml/m  AORTIC VALVE AV Area (Vmax):    2.79 cm AV Area (Vmean):   2.62 cm AV Area (VTI):     2.84 cm AV Vmax:           162.00 cm/s AV Vmean:          113.000 cm/s AV VTI:            0.280 m AV Peak Grad:      10.5 mmHg AV Mean Grad:      6.0 mmHg LVOT Vmax:         144.00 cm/s LVOT Vmean:        94.300 cm/s LVOT VTI:          0.253 m LVOT/AV VTI ratio: 0.90  AORTA Ao Root diam: 2.80 cm Ao Asc diam:  3.20 cm MITRAL VALVE               TRICUSPID VALVE MV Area (PHT): 4.10 cm    TR Peak grad:   24.6 mmHg MV Decel Time: 185 msec    TR Vmax:        248.00 cm/s MV E velocity: 73.80 cm/s MV A velocity: 81.00 cm/s  SHUNTS MV E/A ratio:  0.91        Systemic VTI:  0.25 m                             Systemic Diam: 2.00 cm Fransico Him MD Electronically signed by Fransico Him MD Signature Date/Time: 06/29/2021/3:12:16 PM    Final     Microbiology: Results for orders placed or performed during the hospital encounter of 06/28/21  Resp Panel by RT-PCR (Flu A&B, Covid) Nasopharyngeal Swab     Status: None   Collection Time: 06/28/21  8:03 PM   Specimen: Nasopharyngeal Swab; Nasopharyngeal(NP) swabs in vial transport medium  Result Value Ref Range Status   SARS Coronavirus 2 by RT PCR NEGATIVE NEGATIVE Final    Comment: (NOTE) SARS-CoV-2 target nucleic acids are NOT DETECTED.  The SARS-CoV-2 RNA is generally detectable in upper respiratory specimens during the acute phase of infection. The lowest concentration of SARS-CoV-2 viral copies this assay can detect is 138 copies/mL. A negative result does not preclude SARS-Cov-2 infection and should not be used as the sole basis for treatment or other patient management decisions. A negative result may occur with  improper specimen collection/handling, submission of specimen other than nasopharyngeal swab, presence of viral mutation(s) within the areas targeted by this assay, and inadequate number of viral copies(<138 copies/mL). A  negative result must be combined with clinical observations, patient history, and epidemiological information. The expected result is Negative.  Fact Sheet for Patients:  EntrepreneurPulse.com.au  Fact Sheet for Healthcare Providers:  IncredibleEmployment.be  This test is no t yet approved or cleared by the [place] FDA and  has been authorized for detection and/or diagnosis of SARS-CoV-2 by FDA under an Emergency Use Authorization (EUA). This EUA will remain  in effect (meaning this test can be used) for the duration of the COVID-19 declaration under Section 564(b)(1) of the Act, 21 U.S.C.section 360bbb-3(b)(1), unless the authorization is terminated  or revoked  sooner.       Influenza A by PCR NEGATIVE NEGATIVE Final   Influenza B by PCR NEGATIVE NEGATIVE Final    Comment: (NOTE) The Xpert Xpress SARS-CoV-2/FLU/RSV plus assay is intended as an aid in the diagnosis of influenza from Nasopharyngeal swab specimens and should not be used as a sole basis for treatment. Nasal washings and aspirates are unacceptable for Xpert Xpress SARS-CoV-2/FLU/RSV testing.  Fact Sheet for Patients: EntrepreneurPulse.com.au  Fact Sheet for Healthcare Providers: IncredibleEmployment.be  This test is not yet approved or cleared by the [place] FDA and has been authorized for detection and/or diagnosis of SARS-CoV-2 by FDA under an Emergency Use Authorization (EUA). This EUA will remain in effect (meaning this test can be used) for the duration of the COVID-19 declaration under Section 564(b)(1) of the Act, 21 U.S.C. section 360bbb-3(b)(1), unless the authorization is terminated or revoked.  Performed at [facility], [place] [address], [place],  [address]     Labs: CBC: Recent Labs  Lab 07/02/21 0525 07/03/21 0502 07/04/21 0809 07/06/21 0529  WBC 6.7 6.9 5.7 6.2  NEUTROABS 4.0 3.6 3.3  --   HGB 12.0* 11.9* 12.8* 13.5  HCT 37.5* 36.3* 39.1 41.1  MCV 93.3 91.2 90.1 90.9  PLT 175 PLATELET CLUMPS NOTED ON SMEAR, UNABLE TO ESTIMATE 276 539   Basic Metabolic Panel: Recent Labs  Lab 07/02/21 0525 07/03/21 0502 07/04/21 0809 07/06/21 0529  NA 140 143 141 137  K 4.1 4.6 4.6 4.6  CL 107 109 108 105  CO2 23 25 23 25   GLUCOSE 86 78 80 85  BUN 18 16 18 15   CREATININE 1.46* 1.51* 1.42* 1.33*  CALCIUM 8.6* 8.7* 9.0 8.9  MG 2.3 2.3  --   --   PHOS 4.2 4.9*  --   --    Liver Function Tests: Recent Labs  Lab 07/02/21 0525 07/03/21 0502 07/04/21 0809  AST 17 20 25   ALT 11 11 14   ALKPHOS 89 85 91  BILITOT 0.5 0.6 0.6  PROT 6.5 6.6 7.1  ALBUMIN 3.2* 2.9* 3.3*   CBG: Recent  Labs  Lab 07/02/21 2344 07/03/21 0557  GLUCAP 90 71    Discharge time spent: greater than 30 minutes.  Signed: Mckinley Jewel, MD [facility] 07/08/2021

## 2021-07-08 NOTE — TOC Progression Note (Signed)
Transition of Care [facility]) - Progression Note    Patient Details  Name: Nicholas Oconnell MRN: [id number] Date of Birth: 10-07-1960  Transition of Care [facility] CM/SW Contact  Purcell Mouton, RN Phone Number: 07/08/2021, 9:48 AM  Clinical Narrative:    Spoke with Ms Glennon Mac at Group home pt may return and will transport pt when he is ready to discharge. RN will need to call MS Glennon Mac when pt is ready.    Expected Discharge Plan: Home/Self Care Barriers to Discharge: No Barriers Identified  Expected Discharge Plan and Services Expected Discharge Plan: Home/Self Care   Discharge Planning Services: CM Consult Post Acute Care Choice: Resumption of Svcs/PTA Provider Living arrangements for the past 2 months: Group Home                                       Social Determinants of Health (SDOH) Interventions    Readmission Risk Interventions No flowsheet data found.

## 2021-07-27 ENCOUNTER — Encounter (HOSPITAL_COMMUNITY): Payer: Self-pay | Admitting: Radiology

## 2021-07-31 ENCOUNTER — Encounter (HOSPITAL_COMMUNITY): Payer: Self-pay | Admitting: Radiology

## 2021-09-07 ENCOUNTER — Emergency Department (HOSPITAL_COMMUNITY): Payer: Medicaid Other

## 2021-09-07 ENCOUNTER — Other Ambulatory Visit: Payer: Self-pay

## 2021-09-07 ENCOUNTER — Emergency Department (HOSPITAL_COMMUNITY)
Admission: EM | Admit: 2021-09-07 | Discharge: 2021-09-07 | Disposition: A | Discharge status: Home / Self Care | Payer: Medicaid Other | Attending: Emergency Medicine | Admitting: Emergency Medicine

## 2021-09-07 ENCOUNTER — Encounter (HOSPITAL_COMMUNITY): Payer: Self-pay

## 2021-09-07 DIAGNOSIS — I129 Hypertensive chronic kidney disease with stage 1 through stage 4 chronic kidney disease, or unspecified chronic kidney disease: Secondary | ICD-10-CM | POA: Insufficient documentation

## 2021-09-07 DIAGNOSIS — R059 Cough, unspecified: Secondary | ICD-10-CM | POA: Diagnosis not present

## 2021-09-07 DIAGNOSIS — Z85528 Personal history of other malignant neoplasm of kidney: Secondary | ICD-10-CM | POA: Diagnosis not present

## 2021-09-07 DIAGNOSIS — W1839XA Other fall on same level, initial encounter: Secondary | ICD-10-CM | POA: Insufficient documentation

## 2021-09-07 DIAGNOSIS — R262 Difficulty in walking, not elsewhere classified: Secondary | ICD-10-CM | POA: Diagnosis present

## 2021-09-07 DIAGNOSIS — Z79899 Other long term (current) drug therapy: Secondary | ICD-10-CM | POA: Insufficient documentation

## 2021-09-07 DIAGNOSIS — R251 Tremor, unspecified: Secondary | ICD-10-CM | POA: Diagnosis not present

## 2021-09-07 DIAGNOSIS — R770 Abnormality of albumin: Secondary | ICD-10-CM | POA: Diagnosis not present

## 2021-09-07 DIAGNOSIS — R197 Diarrhea, unspecified: Secondary | ICD-10-CM | POA: Diagnosis not present

## 2021-09-07 DIAGNOSIS — N189 Chronic kidney disease, unspecified: Secondary | ICD-10-CM | POA: Diagnosis not present

## 2021-09-07 DIAGNOSIS — R7989 Other specified abnormal findings of blood chemistry: Secondary | ICD-10-CM | POA: Insufficient documentation

## 2021-09-07 DIAGNOSIS — R270 Ataxia, unspecified: Secondary | ICD-10-CM | POA: Insufficient documentation

## 2021-09-07 DIAGNOSIS — W19XXXA Unspecified fall, initial encounter: Secondary | ICD-10-CM

## 2021-09-07 LAB — CBC WITH DIFFERENTIAL/PLATELET
Abs Immature Granulocytes: 0.03 10*3/uL (ref 0.00–0.07)
Basophils Absolute: 0 10*3/uL (ref 0.0–0.1)
Basophils Relative: 0 %
Eosinophils Absolute: 0.1 10*3/uL (ref 0.0–0.5)
Eosinophils Relative: 2 %
HCT: 42.4 % (ref 39.0–52.0)
Hemoglobin: 13.6 g/dL (ref 13.0–17.0)
Immature Granulocytes: 1 %
Lymphocytes Relative: 33 %
Lymphs Abs: 1.6 10*3/uL (ref 0.7–4.0)
MCH: 30 pg (ref 26.0–34.0)
MCHC: 32.1 g/dL (ref 30.0–36.0)
MCV: 93.6 fL (ref 80.0–100.0)
Monocytes Absolute: 0.6 10*3/uL (ref 0.1–1.0)
Monocytes Relative: 12 %
Neutro Abs: 2.5 10*3/uL (ref 1.7–7.7)
Neutrophils Relative %: 52 %
Platelets: 216 10*3/uL (ref 150–400)
RBC: 4.53 MIL/uL (ref 4.22–5.81)
RDW: 14 % (ref 11.5–15.5)
WBC: 4.8 10*3/uL (ref 4.0–10.5)
nRBC: 0 % (ref 0.0–0.2)

## 2021-09-07 LAB — COMPREHENSIVE METABOLIC PANEL
ALT: 14 U/L (ref 0–44)
AST: 20 U/L (ref 15–41)
Albumin: 2.8 g/dL — ABNORMAL LOW (ref 3.5–5.0)
Alkaline Phosphatase: 100 U/L (ref 38–126)
Anion gap: 5 (ref 5–15)
BUN: 20 mg/dL (ref 8–23)
CO2: 22 mmol/L (ref 22–32)
Calcium: 7 mg/dL — ABNORMAL LOW (ref 8.9–10.3)
Chloride: 114 mmol/L — ABNORMAL HIGH (ref 98–111)
Creatinine, Ser: 1.47 mg/dL — ABNORMAL HIGH (ref 0.61–1.24)
GFR, Estimated: 54 mL/min — ABNORMAL LOW (ref >60)
Glucose, Bld: 78 mg/dL (ref 70–99)
Potassium: 4.9 mmol/L (ref 3.5–5.1)
Sodium: 141 mmol/L (ref 135–145)
Total Bilirubin: 0.4 mg/dL (ref 0.3–1.2)
Total Protein: 5.4 g/dL — ABNORMAL LOW (ref 6.5–8.1)

## 2021-09-07 LAB — URINALYSIS, ROUTINE W REFLEX MICROSCOPIC
Bilirubin Urine: NEGATIVE
Glucose, UA: NEGATIVE mg/dL
Hgb urine dipstick: NEGATIVE
Ketones, ur: NEGATIVE mg/dL
Leukocytes,Ua: NEGATIVE
Nitrite: NEGATIVE
Protein, ur: NEGATIVE mg/dL
Specific Gravity, Urine: 1.01 (ref 1.005–1.030)
pH: 7 (ref 5.0–8.0)

## 2021-09-07 LAB — VALPROIC ACID LEVEL: Valproic Acid Lvl: 60 ug/mL (ref 50.0–100.0)

## 2021-09-07 LAB — LITHIUM LEVEL: Lithium Lvl: 1 mmol/L (ref 0.60–1.20)

## 2021-09-07 MED ORDER — CALCIUM CARBONATE ANTACID 500 MG PO CHEW
1.0000 | CHEWABLE_TABLET | Freq: Once | ORAL | Status: AC
  Administered 2021-09-07: 200 mg via ORAL
  Filled 2021-09-07: qty 1

## 2021-09-07 MED ORDER — LORAZEPAM 2 MG/ML IJ SOLN
1.0000 mg | Freq: Once | INTRAMUSCULAR | Status: AC
Start: 2021-09-07 — End: 2021-09-07
  Administered 2021-09-07: 1 mg via INTRAVENOUS
  Filled 2021-09-07: qty 1

## 2021-09-07 NOTE — ED Triage Notes (Addendum)
Patient lives in a group home [name]. Group home staff member reports that the patient has low levels of Depakote and lithium. ? ? ?Group home staff member states that the patient has been falling a lot recently/ ?

## 2021-09-07 NOTE — ED Notes (Signed)
Patient transported to MRI 

## 2021-09-07 NOTE — ED Provider Notes (Signed)
?[place] DEPT ?Provider Note ? ? ?CSN: [id number] ?Arrival date & time: 09/07/21  1432 ? ?  ? ?History ? ?No chief complaint on file. ? ? ?Nicholas Oconnell is a 61 y.o. male with pertinent past medical history of bipolar affective disorder, seizure, hypertension, anxiety, CKD, cognitive deficits, renal cell carcinoma.  Patient has cognitive deficit.  Caregiver reports that at baseline he is alert to person only.  Level 5 caveat applies. ? ?Brought to the emergency department by group home caregiver with complaint of difficulty walking and frequent falls.  Caregiver reports that over the last 1 to 2 weeks patient has had difficulty walking and has been falling more frequently.  Patient normally is able to stand and ambulate without difficulty.  Patient does have a tremor at baseline.  Reports that patient has been sliding out of his chair landing on the floor and falling from standing position.  Patient has not hit his head or lost consciousness.  Caregiver reports that recent lab testing showed that patient's Depakote and lithium levels were low.  She reports that they have reached out to his psychiatric provider but have not heard back from them yet. ? ?Additionally caregiver reports that patient has been having diarrhea and a nonproductive cough.  She denies patient having any fevers, complains of pain with urination, hematuria, melena, blood in stool, nausea, vomiting. ? ?HPI ? ?  ? ?Home Medications ?Prior to Admission medications   ?Medication Sig Start Date End Date Taking? Authorizing Provider  ?divalproex (DEPAKOTE ER) 500 MG 24 hr tablet Take 2 tablets (1,000 mg total) by mouth at bedtime. 04/28/21   Isla Pence, MD  ?Ensure (ENSURE) Take by mouth 2 (two) times daily as needed (if 50% of meal not consumed).    [provider]  ?haloperidol (HALDOL) 20 MG tablet Take 20 mg by mouth 3 (three) times daily.    [provider]  ?lactulose (CHRONULAC) 10 GM/15ML  solution Take 30 mLs by mouth 3 (three) times daily as needed for constipation. 06/12/21   [provider]  ?lithium carbonate 150 MG capsule Take 150-300 mg by mouth See admin instructions. Take one capsule (150 mg) by mouth every morning and 2 capsules (300 mg) at bedtime    [provider]  ?Melatonin 10 MG CAPS Take 10 mg by mouth at bedtime.    [provider]  ?metoprolol succinate (TOPROL-XL) 25 MG 24 hr tablet Take 25 mg by mouth every morning.    [provider]  ?pantoprazole (PROTONIX) 40 MG tablet Take 40 mg by mouth 2 (two) times daily.    [provider]  ?polyethylene glycol (MIRALAX / GLYCOLAX) 17 g packet Take 17 g by mouth 2 (two) times daily.    [provider]  ?propranolol (INDERAL) 10 MG tablet Take 10 mg by mouth 3 (three) times daily.    [provider]  ?QUEtiapine (SEROQUEL) 400 MG tablet Take 400 mg by mouth 2 (two) times daily. 05/18/20   [provider]  ?senna-docusate (SENOKOT-S) 8.6-50 MG tablet Take 2 tablets by mouth every morning. 11/29/20 12/06/21  [provider]  ?simethicone (GAS RELIEF EXTRA STRENGTH) 125 MG chewable tablet Chew 125 mg by mouth every 6 (six) hours as needed for flatulence.    [provider]  ?zolpidem (AMBIEN) 10 MG tablet Take 10 mg by mouth at bedtime. 06/21/20   [provider]  ?   ? ?Allergies    ?Benzodiazepines and Clozapine   ? ?  Review of Systems   ?Review of Systems  ?Unable to perform ROS: Other (Cognitive disability)  ? ?Physical Exam ?Updated Vital Signs ?BP (!) 148/76 (BP Location: Left Arm)   Pulse 68   Temp 98.9 ?F (37.2 ?C) (Oral)   Resp 16   Ht '5\' 10"'$  (1.778 m)   Wt 83.9 kg   SpO2 99%   BMI 26.54 kg/m?  ?Physical Exam ?Vitals and nursing note reviewed.  ?Constitutional:   ?   General: He is not in acute distress. ?   Appearance: He is not ill-appearing, toxic-appearing or diaphoretic.  ?HENT:  ?   Head: Normocephalic and atraumatic. No raccoon  eyes, Battle's sign, abrasion, contusion, masses or laceration.  ?Eyes:  ?   General:     ?   Right eye: No discharge.     ?   Left eye: No discharge.  ?   Extraocular Movements: Extraocular movements intact.  ?   Conjunctiva/sclera: Conjunctivae normal.  ?   Pupils: Pupils are equal, round, and reactive to light.  ?Cardiovascular:  ?   Rate and Rhythm: Normal rate.  ?Pulmonary:  ?   Effort: Pulmonary effort is normal. No tachypnea, bradypnea or respiratory distress.  ?   Breath sounds: Normal breath sounds. No stridor.  ?Musculoskeletal:  ?   Cervical back: Normal range of motion and neck supple. No rigidity.  ?   Comments: Midline tenderness or deformity to cervical, thoracic, or lumbar spine.  ?Skin: ?   General: Skin is warm and dry.  ?Neurological:  ?   General: No focal deficit present.  ?   Mental Status: He is alert.  ?   GCS: GCS eye subscore is 4. GCS verbal subscore is 5. GCS motor subscore is 6.  ?   Cranial Nerves: No cranial nerve deficit, dysarthria or facial asymmetry.  ?   Sensory: Sensation is intact.  ?   Motor: Tremor present. No weakness, seizure activity or pronator drift.  ?   Coordination: Romberg sign positive. Finger-Nose-Finger Test abnormal.  ?   Comments: CN II-XII intact; performed in supine position, +5 strength to bilateral upper extremities, +5 strength to dorsiflexion and plantarflexion, patient able to lift both legs against gravity and hold each there without difficulty, sensation to light touch grossly intact to bilateral upper and lower extremities.  Patient extremity bilateral upper extremities, this impairs his finger-nose testing.  Patient able to perform heel-to-shin however tremor is noted.  Negative Romberg.  Patient has difficulty ambulating with imbalance requiring him to hold onto provider for stability.  ?Psychiatric:     ?   Behavior: Behavior is cooperative.  ? ? ?ED Results / Procedures / Treatments   ?Labs ?(all labs ordered are listed, but only abnormal results  are displayed) ?Labs Reviewed - No data to display ? ?EKG ?None ? ?Radiology ?No results found. ? ?Procedures ?Procedures  ? ? ?Medications Ordered in ED ?Medications - No data to display ? ?ED Course/ Medical Decision Making/ A&P ?  ?                        ?Medical Decision Making ?Amount and/or Complexity of Data Reviewed ?Labs: ordered. ?Radiology: ordered. ? ?Risk ?OTC drugs. ?Prescription drug management. ? ? ?Alert 61 year old male in no acute distress, nontoxic-appearing.  Patient has intellectual and developmental delay, level 5 caveat applies.  Brought to the emergency department by group home caregiver due to reports of balance issues and frequent falls.  Per  group home provider patient is at baseline mental status.  Caregiver also reports that lithium and Depakote levels were found to be low on testing obtained on 09/04/2021. ? ?Due to reports of frequent falls and patient with intellectual developmental delay will obtain noncontrast head and cervical spine CT to evaluate for acute osseous abnormality.  Will obtain valproic acid level, lithium level, CBC, CMP, and urinalysis. ? ?I personally viewed and interpreted patient's lab results.  Pertinent findings include: ?-Creatinine 1.47, appears to be patient's baseline ?-Calcium 7.0 when corrected for hypoalbuminemia corrected calcium 8.0 ?-Valproic acid and lithium level within normal limits ?-UA shows no signs of infection ? ?I personally viewed and interpreted patient's CT imaging.  Agree with radiology interpretation of no evidence of acute intracranial abnormality, no evidence of acute fracture to cervical spine. ? ?Due to patient's difficulty performing cerebellar function tests and reports of imbalance will obtain MRI imaging to evaluate for CVA.  Patient is outside the stroke window at this time as symptoms have been present over the last 1 to 2 weeks.  MRI imaging shows no acute intracranial abnormality. ? ?As patient has group home to go home to  with caregivers present will discharge home at this time.  Ambulatory referral to neurology placed for patient.  Patient to follow-up with PCP for recheck of calcium levels.  Discussed that patient is a increased f

## 2021-09-07 NOTE — Discharge Instructions (Addendum)
You brought Nicholas Oconnell to the emergency department today to be evaluated for his unsteady gait and frequent falls.  The MRI of his brain did not show any acute abnormalities or signs of stroke.  Due to his increased tremor activity I have placed a referral to neurology.  If you do not hear from them in 3 business days please use the information on this paperwork to call and schedule follow-up appointment. ? ?His lab testing showed that his potassium was slightly below normal.  Please have him follow-up with his primary care doctor to review check this. ? ?Patient should be considered a fall risk and given extra resources when he needs to ambulate.  I have placed a referral for physical therapy to come and evaluate him at the group home. ? ?Get help right away if: ?You have unsteadiness that suddenly worsens. ?You have any of these: ?Severe headaches. ?Chest pain. ?Abdominal pain. ?Weakness or numbness on one side of your body. ?Vision problems. ?Difficulty speaking. ?An irregular heartbeat. ?A very fast pulse. ?You feel confused. ?

## 2021-11-08 ENCOUNTER — Other Ambulatory Visit: Payer: Self-pay

## 2021-11-08 ENCOUNTER — Emergency Department (HOSPITAL_COMMUNITY)
Admission: EM | Admit: 2021-11-08 | Discharge: 2021-11-09 | Disposition: A | Discharge status: Home / Self Care | Payer: Medicaid Other | Attending: Emergency Medicine | Admitting: Emergency Medicine

## 2021-11-08 ENCOUNTER — Encounter (HOSPITAL_COMMUNITY): Payer: Self-pay

## 2021-11-08 ENCOUNTER — Emergency Department (HOSPITAL_COMMUNITY): Payer: Medicaid Other

## 2021-11-08 DIAGNOSIS — I129 Hypertensive chronic kidney disease with stage 1 through stage 4 chronic kidney disease, or unspecified chronic kidney disease: Secondary | ICD-10-CM | POA: Insufficient documentation

## 2021-11-08 DIAGNOSIS — R030 Elevated blood-pressure reading, without diagnosis of hypertension: Secondary | ICD-10-CM | POA: Diagnosis present

## 2021-11-08 DIAGNOSIS — I10 Essential (primary) hypertension: Secondary | ICD-10-CM

## 2021-11-08 DIAGNOSIS — N189 Chronic kidney disease, unspecified: Secondary | ICD-10-CM | POA: Insufficient documentation

## 2021-11-08 LAB — CBC WITH DIFFERENTIAL/PLATELET
Abs Immature Granulocytes: 0.01 10*3/uL (ref 0.00–0.07)
Basophils Absolute: 0 10*3/uL (ref 0.0–0.1)
Basophils Relative: 0 %
Eosinophils Absolute: 0.2 10*3/uL (ref 0.0–0.5)
Eosinophils Relative: 2 %
HCT: 39.8 % (ref 39.0–52.0)
Hemoglobin: 12.9 g/dL — ABNORMAL LOW (ref 13.0–17.0)
Immature Granulocytes: 0 %
Lymphocytes Relative: 30 %
Lymphs Abs: 2.1 10*3/uL (ref 0.7–4.0)
MCH: 29.8 pg (ref 26.0–34.0)
MCHC: 32.4 g/dL (ref 30.0–36.0)
MCV: 91.9 fL (ref 80.0–100.0)
Monocytes Absolute: 0.8 10*3/uL (ref 0.1–1.0)
Monocytes Relative: 12 %
Neutro Abs: 3.9 10*3/uL (ref 1.7–7.7)
Neutrophils Relative %: 56 %
Platelets: 275 10*3/uL (ref 150–400)
RBC: 4.33 MIL/uL (ref 4.22–5.81)
RDW: 14.3 % (ref 11.5–15.5)
WBC: 7 10*3/uL (ref 4.0–10.5)
nRBC: 0 % (ref 0.0–0.2)

## 2021-11-08 LAB — BASIC METABOLIC PANEL
Anion gap: 5 (ref 5–15)
BUN: 22 mg/dL (ref 8–23)
CO2: 24 mmol/L (ref 22–32)
Calcium: 9.1 mg/dL (ref 8.9–10.3)
Chloride: 109 mmol/L (ref 98–111)
Creatinine, Ser: 1.73 mg/dL — ABNORMAL HIGH (ref 0.61–1.24)
GFR, Estimated: 44 mL/min — ABNORMAL LOW (ref >60)
Glucose, Bld: 101 mg/dL — ABNORMAL HIGH (ref 70–99)
Potassium: 4.5 mmol/L (ref 3.5–5.1)
Sodium: 138 mmol/L (ref 135–145)

## 2021-11-08 MED ORDER — PROPRANOLOL HCL 20 MG PO TABS
10.0000 mg | ORAL_TABLET | Freq: Three times a day (TID) | ORAL | Status: DC
  Administered 2021-11-08: 10 mg via ORAL
  Filled 2021-11-08: qty 1

## 2021-11-08 MED ORDER — LABETALOL HCL 5 MG/ML IV SOLN
20.0000 mg | Freq: Once | INTRAVENOUS | Status: DC
Start: 2021-11-08 — End: 2021-11-08

## 2021-11-08 MED ORDER — DIVALPROEX SODIUM ER 500 MG PO TB24
1000.0000 mg | ORAL_TABLET | Freq: Every day | ORAL | Status: DC
  Administered 2021-11-08: 1000 mg via ORAL
  Filled 2021-11-08: qty 2

## 2021-11-08 MED ORDER — ZOLPIDEM TARTRATE 10 MG PO TABS
10.0000 mg | ORAL_TABLET | Freq: Every day | ORAL | Status: DC
  Administered 2021-11-08: 10 mg via ORAL
  Filled 2021-11-08: qty 1

## 2021-11-08 MED ORDER — LITHIUM CARBONATE 150 MG PO CAPS: 150.0000 mg | ORAL_CAPSULE | ORAL | Status: DC

## 2021-11-08 MED ORDER — QUETIAPINE FUMARATE 100 MG PO TABS
400.0000 mg | ORAL_TABLET | Freq: Two times a day (BID) | ORAL | Status: DC
Start: 2021-11-08 — End: 2021-11-09
  Administered 2021-11-08: 400 mg via ORAL
  Filled 2021-11-08: qty 1

## 2021-11-08 NOTE — ED Provider Notes (Signed)
[facility] DEPT Provider Note   CSN: [id number] Arrival date & time: 11/08/21  1754     History  Chief Complaint  Patient presents with   Hypertension    Nicholas Oconnell is a 61 y.o. male.   Hypertension    61 year old male with medical history significant for hypertension, bipolar disorder, seizures, CKD, cognitive deficits who presents to the emergency department with a chief complaint of hypertension at his group home.  History is augmented by group home staff present bedside.  During daily meds today they noticed that the patient was hypertensive with a blood pressure with ranges in the 160s to 140s.  The patient has had no seizure-like activity.  He has been compliant with his medications.  He denies any blurry vision or vision changes, chest pain, shortness of breath, abdominal pain, nausea or vomiting.  Home Medications Prior to Admission medications   Medication Sig Start Date End Date Taking? Authorizing Provider  amLODipine (NORVASC) 10 MG tablet Take 10 mg by mouth daily.   Yes [provider]  divalproex (DEPAKOTE ER) 500 MG 24 hr tablet Take 2 tablets (1,000 mg total) by mouth at bedtime. 04/28/21  Yes Isla Pence, MD  Ensure (ENSURE) Take by mouth 2 (two) times daily as needed (if 50% of meal not consumed).   Yes [provider]  haloperidol (HALDOL) 20 MG tablet Take 20 mg by mouth 3 (three) times daily.   Yes [provider]  lactulose (CHRONULAC) 10 GM/15ML solution Take 30 mLs by mouth 3 (three) times daily as needed for constipation. 06/12/21  Yes [provider]  lithium carbonate 150 MG capsule Take 150-300 mg by mouth See admin instructions. Take one capsule (150 mg) by mouth every morning and 2 capsules (300 mg) at bedtime   Yes [provider]  Melatonin 10 MG CAPS Take 10 mg by mouth at bedtime.   Yes [provider]  mineral oil enema Place 1 enema rectally daily as needed  for severe constipation.   Yes [provider]  pantoprazole (PROTONIX) 40 MG tablet Take 40 mg by mouth 2 (two) times daily.   Yes [provider]  polyethylene glycol (MIRALAX / GLYCOLAX) 17 g packet Take 17 g by mouth 2 (two) times daily.   Yes [provider]  propranolol (INDERAL) 10 MG tablet Take 10 mg by mouth 3 (three) times daily.   Yes [provider]  QUEtiapine (SEROQUEL) 400 MG tablet Take 400 mg by mouth 2 (two) times daily. 05/18/20  Yes [provider]  senna-docusate (SENOKOT-S) 8.6-50 MG tablet Take 2 tablets by mouth every morning. 11/29/20 12/06/21 Yes [provider]  simethicone (MYLICON) 932 MG chewable tablet Chew 125 mg by mouth every 6 (six) hours as needed for flatulence.   Yes [provider]  zolpidem (AMBIEN) 10 MG tablet Take 10 mg by mouth at bedtime. 06/21/20  Yes [provider]      Allergies    Benzodiazepines and Clozapine    Review of Systems   Review of Systems  All other systems reviewed and are negative.   Physical Exam Updated Vital Signs BP (!) 142/93 (BP Location: Left Arm)   Pulse 83   Temp 98.9 F (37.2 C) (Oral)   Resp 16   SpO2 99%  Physical Exam Vitals and nursing note reviewed.  Constitutional:      General: He is not in acute distress.    Appearance: He is well-developed.  HENT:  Head: Normocephalic and atraumatic.  Eyes:     Conjunctiva/sclera: Conjunctivae normal.  Cardiovascular:     Rate and Rhythm: Normal rate and regular rhythm.  Pulmonary:     Effort: Pulmonary effort is normal. No respiratory distress.     Breath sounds: Normal breath sounds.  Abdominal:     Palpations: Abdomen is soft.     Tenderness: There is no abdominal tenderness.  Musculoskeletal:        General: No swelling.     Cervical back: Neck supple.  Skin:    General: Skin is warm and dry.     Capillary Refill: Capillary refill takes less than 2 seconds.  Neurological:      General: No focal deficit present.     Mental Status: He is alert and oriented to person, place, and time. Mental status is at baseline.  Psychiatric:        Mood and Affect: Mood normal.     ED Results / Procedures / Treatments   Labs (all labs ordered are listed, but only abnormal results are displayed) Labs Reviewed  BASIC METABOLIC PANEL - Abnormal; Notable for the following components:      Result Value   Glucose, Bld 101 (*)    Creatinine, Ser 1.73 (*)    GFR, Estimated 44 (*)    All other components within normal limits  CBC WITH DIFFERENTIAL/PLATELET - Abnormal; Notable for the following components:   Hemoglobin 12.9 (*)    All other components within normal limits  VALPROIC ACID LEVEL - Abnormal; Notable for the following components:   Valproic Acid Lvl <10 (*)    All other components within normal limits  TROPONIN I (HIGH SENSITIVITY)    EKG EKG Interpretation  Date/Time:  Wednesday November 08 2021 22:14:57 EDT Ventricular Rate:  86 PR Interval:    QRS Duration: 83 QT Interval:  383 QTC Calculation: 459 R Axis:   34 Text Interpretation: Atrial flutter with varied AV block, Borderline T wave abnormalities Borderline ST elevation, anterior leads Artifact in lead(s) I III aVL aVF V3 Confirmed by Regan Lemming ([id number]) on 11/08/2021 10:33:09 PM  Radiology No results found.  Procedures Procedures    Medications Ordered in ED Medications - No data to display  ED Course/ Medical Decision Making/ A&P                           Medical Decision Making Amount and/or Complexity of Data Reviewed Labs: ordered. Radiology: ordered.    61 year old male with medical history significant for hypertension, bipolar disorder, seizures, CKD, cognitive deficits who presents to the emergency department with a chief complaint of hypertension at his group home.  History is augmented by group home staff present bedside.  During daily meds today they noticed that the patient was  hypertensive with a blood pressure with ranges in the 160s to 140s.  The patient has had no seizure-like activity.  He has been compliant with his medications.  He denies any blurry vision or vision changes, chest pain, shortness of breath, abdominal pain, nausea or vomiting.  On arrival, the patient was afebrile, hemodynamically stable, hypertensive BP 146/132, subsequently improved to 114/89 without intervention.  Currently asymptomatic at this time.  The patient presents with an EKG that shows atrial flutter that is rate controlled at this time.  Cardiac troponins were collected and pending.  BMP revealed a creatinine at baseline for the patient's CKD, CBC was without a leukocytosis, stable anemia  to 12.9.  A Depakote level was sent and pending.  Plan at time of signout to follow-up on the patient's troponin, likely discharge home with a diagnosis of asymptomatic hypertension.   Final Clinical Impression(s) / ED Diagnoses Final diagnoses:  Hypertension, unspecified type    Rx / DC Orders ED Discharge Orders     None         Regan Lemming, MD 11/11/21 [phone]

## 2021-11-08 NOTE — ED Triage Notes (Signed)
Pt BIB group home staff member for recent HTN and shakiness. Group home staff states that he is being evaluated for Parkinsons by neurology.

## 2021-11-08 NOTE — ED Provider Triage Note (Signed)
Emergency Medicine Provider Triage Evaluation Note  Nicholas Oconnell , a 61 y.o. male  was evaluated in triage.  Pt complains of hypertension.  Patient is accompanied by group home staff who states that during daily meds today they noticed the patient was hypertensive with a blood pressure somewhere in the range of 160/140.  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  Patient has no complaints when asked.  Blood pressure was 146/132 in triage  Review of Systems  Positive: Tremors Negative: Chest pain, shortness of breath, headache, urinary symptoms  Physical Exam  BP (!) 146/132 (BP Location: Left Arm)   Pulse 96   Temp 98.2 F (36.8 C) (Oral)   Resp 18   SpO2 97%  Gen:   Awake, no distress   Resp:  Normal effort  MSK:   Moves extremities without difficulty  Other:    Medical Decision Making  Medically screening exam initiated at 6:23 PM.  Appropriate orders placed.  Nicholas Oconnell was informed that the remainder of the evaluation will be completed by another provider, this initial triage assessment does not replace that evaluation, and the importance of remaining in the ED until their evaluation is complete.     Dorothyann Peng, PA-C 11/08/21 1827

## 2021-11-09 LAB — TROPONIN I (HIGH SENSITIVITY): Troponin I (High Sensitivity): 10 ng/L (ref <18)

## 2021-11-09 LAB — VALPROIC ACID LEVEL: Valproic Acid Lvl: <10 ug/mL — ABNORMAL LOW (ref 50.0–100.0)

## 2021-11-09 MED ORDER — LITHIUM CARBONATE 300 MG PO CAPS: 300.0000 mg | ORAL_CAPSULE | Freq: Every day | ORAL | Status: DC

## 2021-11-09 MED ORDER — LITHIUM CARBONATE 150 MG PO CAPS
150.0000 mg | ORAL_CAPSULE | Freq: Every day | ORAL | Status: DC
  Filled 2021-11-09: qty 1

## 2021-11-09 NOTE — ED Notes (Signed)
Patient verbalizes understanding of discharge instructions. Opportunity for questioning and answers were provided. Armband removed by staff, pt discharged from ED to group home with caregivers. Assisted to dress and get into wheelchair.

## 2021-11-28 ENCOUNTER — Other Ambulatory Visit: Payer: Self-pay

## 2021-11-28 ENCOUNTER — Encounter (HOSPITAL_COMMUNITY): Payer: Self-pay | Admitting: Emergency Medicine

## 2021-11-28 ENCOUNTER — Emergency Department (HOSPITAL_COMMUNITY)
Admission: EM | Admit: 2021-11-28 | Discharge: 2021-11-28 | Disposition: A | Discharge status: Home / Self Care | Payer: Medicaid Other | Attending: Emergency Medicine | Admitting: Emergency Medicine

## 2021-11-28 ENCOUNTER — Emergency Department (HOSPITAL_COMMUNITY): Payer: Medicaid Other

## 2021-11-28 DIAGNOSIS — G20 Parkinson's disease: Secondary | ICD-10-CM | POA: Diagnosis not present

## 2021-11-28 DIAGNOSIS — S8001XA Contusion of right knee, initial encounter: Secondary | ICD-10-CM | POA: Insufficient documentation

## 2021-11-28 DIAGNOSIS — S8991XA Unspecified injury of right lower leg, initial encounter: Secondary | ICD-10-CM | POA: Diagnosis present

## 2021-11-28 DIAGNOSIS — R799 Abnormal finding of blood chemistry, unspecified: Secondary | ICD-10-CM | POA: Insufficient documentation

## 2021-11-28 DIAGNOSIS — N189 Chronic kidney disease, unspecified: Secondary | ICD-10-CM | POA: Insufficient documentation

## 2021-11-28 DIAGNOSIS — W108XXA Fall (on) (from) other stairs and steps, initial encounter: Secondary | ICD-10-CM | POA: Diagnosis not present

## 2021-11-28 DIAGNOSIS — Y92009 Unspecified place in unspecified non-institutional (private) residence as the place of occurrence of the external cause: Secondary | ICD-10-CM | POA: Diagnosis not present

## 2021-11-28 DIAGNOSIS — S0990XA Unspecified injury of head, initial encounter: Secondary | ICD-10-CM | POA: Insufficient documentation

## 2021-11-28 DIAGNOSIS — F0390 Unspecified dementia without behavioral disturbance: Secondary | ICD-10-CM | POA: Insufficient documentation

## 2021-11-28 DIAGNOSIS — W19XXXA Unspecified fall, initial encounter: Secondary | ICD-10-CM

## 2021-11-28 LAB — CBG MONITORING, ED: Glucose-Capillary: 118 mg/dL — ABNORMAL HIGH (ref 70–99)

## 2021-11-28 MED ORDER — ACETAMINOPHEN 500 MG PO TABS
1000.0000 mg | ORAL_TABLET | Freq: Once | ORAL | Status: AC
  Administered 2021-11-28: 1000 mg via ORAL
  Filled 2021-11-28: qty 2

## 2021-11-28 NOTE — ED Provider Notes (Signed)
[facility] DEPT Provider Note   CSN: [id number] Arrival date & time: 11/28/21  1855     History  Chief Complaint  Patient presents with   Nicholas Oconnell is a 61 y.o. male.   Fall  Patient is a 61 year old male with past medical history significant for schizoaffective disorder, Parkinson disease followed by neurology, dementia, CKD, cognitive deficits, anxiety, seizures  Patient is presented emergency room today with caregiver from group home [facility].  Seems that patient lives in the group home and has long-term.  Caregiver who is with the patient provides history given patient is unable to provide history which is his baseline.  Per caregiver patient fell down several steps of a staircase today.  Seems that he appeared to lose balance while he was walking on the stairs.  Caregiver states that he scraped his face on the stairs while sliding down them.  He did not lose consciousness per caregiver.  Patient shakes his head no when asked if he has any pain however he is not a very reliable historian.  Caregiver says that he continues to be at his mental baseline.  This fall occurred approximately an hour before arrival in the emergency room.     Home Medications Prior to Admission medications   Medication Sig Start Date End Date Taking? Authorizing Provider  amLODipine (NORVASC) 10 MG tablet Take 10 mg by mouth daily.    [provider]  divalproex (DEPAKOTE ER) 500 MG 24 hr tablet Take 2 tablets (1,000 mg total) by mouth at bedtime. 04/28/21   Isla Pence, MD  Ensure (ENSURE) Take by mouth 2 (two) times daily as needed (if 50% of meal not consumed).    [provider]  haloperidol (HALDOL) 20 MG tablet Take 20 mg by mouth 3 (three) times daily.    [provider]  lactulose (CHRONULAC) 10 GM/15ML solution Take 30 mLs by mouth 3 (three) times daily as needed for constipation. 06/12/21   [provider]   lithium carbonate 150 MG capsule Take 150-300 mg by mouth See admin instructions. Take one capsule (150 mg) by mouth every morning and 2 capsules (300 mg) at bedtime    [provider]  Melatonin 10 MG CAPS Take 10 mg by mouth at bedtime.    [provider]  mineral oil enema Place 1 enema rectally daily as needed for severe constipation.    [provider]  pantoprazole (PROTONIX) 40 MG tablet Take 40 mg by mouth 2 (two) times daily.    [provider]  polyethylene glycol (MIRALAX / GLYCOLAX) 17 g packet Take 17 g by mouth 2 (two) times daily.    [provider]  propranolol (INDERAL) 10 MG tablet Take 10 mg by mouth 3 (three) times daily.    [provider]  QUEtiapine (SEROQUEL) 400 MG tablet Take 400 mg by mouth 2 (two) times daily. 05/18/20   [provider]  senna-docusate (SENOKOT-S) 8.6-50 MG tablet Take 2 tablets by mouth every morning. 11/29/20 12/06/21  [provider]  simethicone (MYLICON) 425 MG chewable tablet Chew 125 mg by mouth every 6 (six) hours as needed for flatulence.    [provider]  zolpidem (AMBIEN) 10 MG tablet Take 10 mg by mouth at bedtime. 06/21/20   [provider]      Allergies    Benzodiazepines and Clozapine    Review of Systems   Review of Systems  Physical Exam Updated Vital  Signs BP 125/89   Pulse 89   Temp 98.3 F (36.8 C) (Oral)   Resp 16   Ht '5\' 8"'$  (1.727 m)   Wt 70.3 kg   SpO2 98%   BMI 23.57 kg/m  Physical Exam Vitals and nursing note reviewed.  Constitutional:      General: He is not in acute distress. HENT:     Head: Normocephalic and atraumatic.     Nose: Nose normal.  Eyes:     General: No scleral icterus. Cardiovascular:     Rate and Rhythm: Normal rate and regular rhythm.     Pulses: Normal pulses.     Heart sounds: Normal heart sounds.  Pulmonary:     Effort: Pulmonary effort is normal. No respiratory distress.     Breath sounds: No  wheezing.  Abdominal:     Palpations: Abdomen is soft.     Tenderness: There is no abdominal tenderness.  Musculoskeletal:     Cervical back: Normal range of motion.     Right lower leg: No edema.     Left lower leg: No edema.     Comments: No bony tenderness over joints or long bones of the upper and lower extremities.    No neck or back midline tenderness, step-off, deformity, or bruising. Able to turn head left and right 45 degrees without difficulty.  Full range of motion of upper and lower extremity joints shown after palpation was conducted; with 5/5 symmetrical strength in upper and lower extremities. No chest wall tenderness, no facial or cranial tenderness.   Patient has intact sensation grossly in lower and upper extremities. Intact patellar and ankle reflexes. Patient able to ambulate without difficulty.  Radial and DP pulses palpated BL.    Skin:    General: Skin is warm and dry.     Capillary Refill: Capillary refill takes less than 2 seconds.     Comments: There is a superficial scrape to the left cheek, superficial scrape to right knee. No bleeding or lacerations  Neurological:     Mental Status: He is alert. Mental status is at baseline.  Psychiatric:        Mood and Affect: Mood normal.        Behavior: Behavior normal.     ED Results / Procedures / Treatments   Labs (all labs ordered are listed, but only abnormal results are displayed) Labs Reviewed  CBG MONITORING, ED - Abnormal; Notable for the following components:      Result Value   Glucose-Capillary 118 (*)    All other components within normal limits    EKG None  Radiology DG Knee Complete 4 Views Right  Result Date: 11/28/2021 CLINICAL DATA:  Fall, right knee pain EXAM: RIGHT KNEE - COMPLETE 4+ VIEW COMPARISON:  None Available. FINDINGS: Normal alignment. No acute fracture or dislocation. Mild medial compartment degenerative arthritis. No effusion. Soft tissues are unremarkable. IMPRESSION: Mild  medial compartment degenerative arthritis. Electronically Signed   By: Fidela Salisbury M.D.   On: 11/28/2021 20:41   CT HEAD WO CONTRAST (5MM)  Result Date: 11/28/2021 CLINICAL DATA:  Head trauma, moderate-severe parkinson's dementia - head injury. Fall. EXAM: CT HEAD WITHOUT CONTRAST TECHNIQUE: Contiguous axial images were obtained from the base of the skull through the vertex without intravenous contrast. RADIATION DOSE REDUCTION: This exam was performed according to the departmental dose-optimization program which includes automated exposure control, adjustment of the mA and/or kV according to patient size and/or use of iterative reconstruction technique. COMPARISON:  09/07/2021 FINDINGS: Brain: No acute intracranial abnormality. Specifically, no hemorrhage, hydrocephalus, mass lesion, acute infarction, or significant intracranial injury. There is atrophy and chronic small vessel disease changes. Vascular: No hyperdense vessel or unexpected calcification. Skull: No acute calvarial abnormality. Sinuses/Orbits: No acute findings Other: None IMPRESSION: Atrophy, chronic microvascular disease. No acute intracranial abnormality. Electronically Signed   By: Rolm Baptise M.D.   On: 11/28/2021 20:21    Procedures Procedures    Medications Ordered in ED Medications  acetaminophen (TYLENOL) tablet 1,000 mg (1,000 mg Oral Given 11/28/21 2016)    ED Course/ Medical Decision Making/ A&P Clinical Course as of 11/28/21 2237  Tue Nov 28, 2021  2052 ED EKG EKG w likely artifact. Discussed with Dr. Johnney Killian.   [WF]    Clinical Course User Index [WF] Tedd Sias, PA                           Medical Decision Making Amount and/or Complexity of Data Reviewed Radiology: ordered. ECG/medicine tests:  Decision-making details documented in ED Course.  Risk OTC drugs.   Patient here after mechanical fall per caregiver.  Small superficial scrape on left forehead and small abrasion to knee.  Patient  is pleasantly demented.  Smiling throughout evaluation.  Trauma exam reassuring.  Abrasions are clean.  Has a history of Parkinson's disease  Is being followed by neurology for this.  X-ray right knee and CT head unremarkable.  There are some chronic microvascular changes consistent with patient's age and disease present on CT scan.  No acute findings.  Discussed with Dr. Vallery Ridge as his EKG has some artifact on it.  No acute abnormalities on EKG no ischemia or arrhythmia. On prior EKG earlier this month it seems that there was evidence of a flutter however this is most likely artifact  We will discharge home at this time.  Return precautions discussed with caregiver.  Patient smiling and requesting candy at time of discharge.  Is tolerating p.o.  All questions answered to the best my ability.  Vital signs within normal limits at time of discharge  Final Clinical Impression(s) / ED Diagnoses Final diagnoses:  Fall, initial encounter  Injury of head, initial encounter  Contusion of right knee, initial encounter    Rx / DC Orders ED Discharge Orders     None         Tedd Sias, [place] 11/28/21 2239    Charlesetta Shanks, MD 12/01/21 1232

## 2021-12-11 ENCOUNTER — Emergency Department (HOSPITAL_COMMUNITY)
Admission: EM | Admit: 2021-12-11 | Discharge: 2021-12-11 | Disposition: A | Discharge status: Home / Self Care | Payer: Medicaid Other | Attending: Emergency Medicine | Admitting: Emergency Medicine

## 2021-12-11 ENCOUNTER — Encounter (HOSPITAL_COMMUNITY): Payer: Self-pay

## 2021-12-11 ENCOUNTER — Emergency Department (HOSPITAL_COMMUNITY): Payer: Medicaid Other

## 2021-12-11 DIAGNOSIS — W01198A Fall on same level from slipping, tripping and stumbling with subsequent striking against other object, initial encounter: Secondary | ICD-10-CM | POA: Diagnosis not present

## 2021-12-11 DIAGNOSIS — S0591XA Unspecified injury of right eye and orbit, initial encounter: Secondary | ICD-10-CM | POA: Diagnosis present

## 2021-12-11 DIAGNOSIS — Y92009 Unspecified place in unspecified non-institutional (private) residence as the place of occurrence of the external cause: Secondary | ICD-10-CM | POA: Diagnosis not present

## 2021-12-11 DIAGNOSIS — S01111A Laceration without foreign body of right eyelid and periocular area, initial encounter: Secondary | ICD-10-CM | POA: Insufficient documentation

## 2021-12-11 DIAGNOSIS — W19XXXA Unspecified fall, initial encounter: Secondary | ICD-10-CM

## 2021-12-11 DIAGNOSIS — S0990XA Unspecified injury of head, initial encounter: Secondary | ICD-10-CM

## 2021-12-11 DIAGNOSIS — S0181XA Laceration without foreign body of other part of head, initial encounter: Secondary | ICD-10-CM | POA: Insufficient documentation

## 2021-12-11 NOTE — ED Notes (Signed)
Pt's caregiver states understanding of dc instructions, importance of follow up. Pt's caregiver denies questions or concerns upon dc. Pt assisted into wheelchair and wheeled out of ed. No belongings left in room upon dc.

## 2021-12-11 NOTE — ED Provider Notes (Signed)
[facility] DEPT Provider Note   CSN: [id number] Arrival date & time: 12/11/21  4665     History  Chief Complaint  Patient presents with   Lytle Michaels    Nicholas Oconnell is a 61 y.o. male.  61 year old male with prior medical history as detailed below presents with his group home staff from his group home.  Patient had a trip and fall this morning.  Patient apparently tried to use the bathroom with a light off and then lost his balance and fell.  He struck his right eyebrow against the floor the bathroom.  He has a small laceration of the right eyebrow.  Staff brought him to the ED for evaluation of the head injury and laceration.  Patient is up-to-date on his tetanus.  Patient without other injury.  Patient without apparent LOC.  Patient is at his normal mental status baseline.   The history is provided by the patient, medical records and a caregiver.  Fall This is a new problem. The current episode started less than 1 hour ago. The problem occurs every several days. The problem has not changed since onset.Pertinent negatives include no chest pain and no abdominal pain. Nothing aggravates the symptoms. Nothing relieves the symptoms.       Home Medications Prior to Admission medications   Medication Sig Start Date End Date Taking? Authorizing Provider  amLODipine (NORVASC) 10 MG tablet Take 10 mg by mouth daily.    [provider]  divalproex (DEPAKOTE ER) 500 MG 24 hr tablet Take 2 tablets (1,000 mg total) by mouth at bedtime. 04/28/21   Isla Pence, MD  Ensure (ENSURE) Take by mouth 2 (two) times daily as needed (if 50% of meal not consumed).    [provider]  haloperidol (HALDOL) 20 MG tablet Take 20 mg by mouth 3 (three) times daily.    [provider]  lactulose (CHRONULAC) 10 GM/15ML solution Take 30 mLs by mouth 3 (three) times daily as needed for constipation. 06/12/21   [provider]  lithium carbonate 150  MG capsule Take 150-300 mg by mouth See admin instructions. Take one capsule (150 mg) by mouth every morning and 2 capsules (300 mg) at bedtime    [provider]  Melatonin 10 MG CAPS Take 10 mg by mouth at bedtime.    [provider]  mineral oil enema Place 1 enema rectally daily as needed for severe constipation.    [provider]  pantoprazole (PROTONIX) 40 MG tablet Take 40 mg by mouth 2 (two) times daily.    [provider]  polyethylene glycol (MIRALAX / GLYCOLAX) 17 g packet Take 17 g by mouth 2 (two) times daily.    [provider]  propranolol (INDERAL) 10 MG tablet Take 10 mg by mouth 3 (three) times daily.    [provider]  QUEtiapine (SEROQUEL) 400 MG tablet Take 400 mg by mouth 2 (two) times daily. 05/18/20   [provider]  simethicone (MYLICON) 993 MG chewable tablet Chew 125 mg by mouth every 6 (six) hours as needed for flatulence.    [provider]  zolpidem (AMBIEN) 10 MG tablet Take 10 mg by mouth at bedtime. 06/21/20   [provider]      Allergies    Benzodiazepines and Clozapine    Review of Systems   Review of Systems  Cardiovascular:  Negative for chest pain.  Gastrointestinal:  Negative for abdominal pain.  All other systems reviewed and are  negative.   Physical Exam Updated Vital Signs BP (!) 131/92 (BP Location: Right Arm)   Pulse 80   Temp 97.9 F (36.6 C) (Oral)   Resp 16   SpO2 98%  Physical Exam Vitals and nursing note reviewed.  Constitutional:      General: He is not in acute distress.    Appearance: Normal appearance. He is well-developed.  HENT:     Head: Normocephalic.     Comments: Linear 1 cm laceration to the area just inferior to the right eyebrow.  No active bleeding. Eyes:     Conjunctiva/sclera: Conjunctivae normal.     Pupils: Pupils are equal, round, and reactive to light.  Cardiovascular:     Rate and Rhythm: Normal rate and regular rhythm.      Heart sounds: Normal heart sounds.  Pulmonary:     Effort: Pulmonary effort is normal. No respiratory distress.     Breath sounds: Normal breath sounds.  Abdominal:     General: There is no distension.     Palpations: Abdomen is soft.     Tenderness: There is no abdominal tenderness.  Musculoskeletal:        General: No deformity. Normal range of motion.     Cervical back: Normal range of motion and neck supple.  Skin:    General: Skin is warm and dry.  Neurological:     General: No focal deficit present.     Mental Status: He is alert and oriented to person, place, and time.     ED Results / Procedures / Treatments   Labs (all labs ordered are listed, but only abnormal results are displayed) Labs Reviewed - No data to display  EKG None  Radiology No results found.  Procedures .Marland KitchenLaceration Repair  Date/Time: 12/11/2021 10:31 AM  Performed by: Valarie Merino, MD Authorized by: Valarie Merino, MD   Consent:    Consent obtained:  Verbal   Consent given by:  Patient   Risks, benefits, and alternatives were discussed: yes     Risks discussed:  Infection, need for additional repair, nerve damage, poor wound healing, poor cosmetic result, pain, retained foreign body, tendon damage and vascular damage Universal protocol:    Immediately prior to procedure, a time out was called: yes     Patient identity confirmed:  Verbally with patient Anesthesia:    Anesthesia method:  None Laceration details:    Location:  Face   Face location:  R eyebrow   Length (cm):  1 Pre-procedure details:    Preparation:  Imaging obtained to evaluate for foreign bodies Exploration:    Limited defect created (wound extended): no     Hemostasis achieved with:  Direct pressure   Imaging outcome: foreign body not noted     Contaminated: no   Treatment:    Area cleansed with:  Saline   Amount of cleaning:  Standard Skin repair:    Repair method:  Tissue adhesive Approximation:     Approximation:  Close Repair type:    Repair type:  Simple Post-procedure details:    Dressing:  Open (no dressing)   Procedure completion:  Tolerated     Medications Ordered in ED Medications - No data to display  ED Course/ Medical Decision Making/ A&P                           Medical Decision Making Amount and/or Complexity of Data Reviewed Radiology: ordered.  Medical Screen Complete  This patient presented to the ED with complaint of fall, head injury, laceration.  This complaint involves an extensive number of treatment options. The initial differential diagnosis includes, but is not limited to, trauma related to fall  This presentation is: Acute, Self-Limited, Previously Undiagnosed, Uncertain Prognosis, Complicated, Systemic Symptoms, and Threat to Life/Bodily Function  Patient presented after mechanical fall with mild head injury.  Imaging obtained is without significant abnormality.  Laceration repaired with Dermabond easily.  Patient does understand need for close outpatient follow-up.  Patient care provider at bedside also understands need for close outpatient follow-up.  Strict return precautions given and understood.   Additional history obtained:  Additional history obtained from Caregiver External records from outside sources obtained and reviewed including prior ED visits and prior Inpatient records.    Imaging Studies ordered:  I ordered imaging studies including CT head, CT C-spine I independently visualized and interpreted obtained imaging which showed nad I agree with the radiologist interpretation. :  Problem List / ED Course:  Head injury, laceration   Reevaluation:  After the interventions noted above, I reevaluated the patient and found that they have: improved   Disposition:  After consideration of the diagnostic results and the patients response to treatment, I feel that the patent would benefit from close outpatient  follow-up.          Final Clinical Impression(s) / ED Diagnoses Final diagnoses:  Fall, initial encounter  Injury of head, initial encounter  Facial laceration, initial encounter    Rx / DC Orders ED Discharge Orders     None         Valarie Merino, MD 12/15/21 [phone]

## 2021-12-11 NOTE — Discharge Instructions (Addendum)
Return for any problem.  Your laceration was closed with skin adhesive.  This will fall off on its own over the next week.  Imaging obtained of your head and neck is without evidence of significant internal injury.

## 2021-12-11 NOTE — ED Triage Notes (Signed)
Pt arrived via POV, with group home staff. Staff states pt tripped and fell in bathroom today. Laceration above right eye. Bleeding controlled. Staff states this is second fall within the week.   [name] notified that patient is here.

## 2022-01-03 ENCOUNTER — Emergency Department (HOSPITAL_COMMUNITY): Payer: Medicaid Other

## 2022-01-03 ENCOUNTER — Inpatient Hospital Stay (HOSPITAL_COMMUNITY)
Admission: EM | Admit: 2022-01-03 | Discharge: 2022-01-16 | DRG: 389 | Disposition: A | Discharge status: Home Health | Payer: Medicaid Other | Attending: Internal Medicine | Admitting: Internal Medicine

## 2022-01-03 ENCOUNTER — Other Ambulatory Visit: Payer: Self-pay

## 2022-01-03 ENCOUNTER — Encounter (HOSPITAL_COMMUNITY): Payer: Self-pay

## 2022-01-03 DIAGNOSIS — R748 Abnormal levels of other serum enzymes: Secondary | ICD-10-CM | POA: Diagnosis present

## 2022-01-03 DIAGNOSIS — K5981 Ogilvie syndrome: Secondary | ICD-10-CM | POA: Diagnosis present

## 2022-01-03 DIAGNOSIS — K5900 Constipation, unspecified: Secondary | ICD-10-CM

## 2022-01-03 DIAGNOSIS — K567 Ileus, unspecified: Secondary | ICD-10-CM | POA: Diagnosis present

## 2022-01-03 DIAGNOSIS — G40909 Epilepsy, unspecified, not intractable, without status epilepticus: Secondary | ICD-10-CM | POA: Diagnosis present

## 2022-01-03 DIAGNOSIS — N1831 Chronic kidney disease, stage 3a: Secondary | ICD-10-CM | POA: Diagnosis present

## 2022-01-03 DIAGNOSIS — N183 Chronic kidney disease, stage 3 unspecified: Secondary | ICD-10-CM | POA: Diagnosis present

## 2022-01-03 DIAGNOSIS — F250 Schizoaffective disorder, bipolar type: Secondary | ICD-10-CM | POA: Diagnosis present

## 2022-01-03 DIAGNOSIS — G2401 Drug induced subacute dyskinesia: Secondary | ICD-10-CM | POA: Diagnosis present

## 2022-01-03 DIAGNOSIS — R531 Weakness: Secondary | ICD-10-CM

## 2022-01-03 DIAGNOSIS — R4189 Other symptoms and signs involving cognitive functions and awareness: Secondary | ICD-10-CM | POA: Diagnosis present

## 2022-01-03 DIAGNOSIS — Z888 Allergy status to other drugs, medicaments and biological substances status: Secondary | ICD-10-CM

## 2022-01-03 DIAGNOSIS — R140 Abdominal distension (gaseous): Secondary | ICD-10-CM | POA: Diagnosis present

## 2022-01-03 DIAGNOSIS — Z905 Acquired absence of kidney: Secondary | ICD-10-CM

## 2022-01-03 DIAGNOSIS — G2119 Other drug induced secondary parkinsonism: Secondary | ICD-10-CM | POA: Diagnosis present

## 2022-01-03 DIAGNOSIS — Z79899 Other long term (current) drug therapy: Secondary | ICD-10-CM

## 2022-01-03 DIAGNOSIS — N179 Acute kidney failure, unspecified: Secondary | ICD-10-CM | POA: Diagnosis present

## 2022-01-03 DIAGNOSIS — Z9049 Acquired absence of other specified parts of digestive tract: Secondary | ICD-10-CM

## 2022-01-03 DIAGNOSIS — T43595A Adverse effect of other antipsychotics and neuroleptics, initial encounter: Secondary | ICD-10-CM | POA: Diagnosis present

## 2022-01-03 DIAGNOSIS — Z8669 Personal history of other diseases of the nervous system and sense organs: Secondary | ICD-10-CM

## 2022-01-03 DIAGNOSIS — Z85528 Personal history of other malignant neoplasm of kidney: Secondary | ICD-10-CM

## 2022-01-03 DIAGNOSIS — K5641 Fecal impaction: Principal | ICD-10-CM | POA: Diagnosis present

## 2022-01-03 DIAGNOSIS — I129 Hypertensive chronic kidney disease with stage 1 through stage 4 chronic kidney disease, or unspecified chronic kidney disease: Secondary | ICD-10-CM | POA: Diagnosis present

## 2022-01-03 DIAGNOSIS — F419 Anxiety disorder, unspecified: Secondary | ICD-10-CM | POA: Diagnosis present

## 2022-01-03 DIAGNOSIS — I10 Essential (primary) hypertension: Secondary | ICD-10-CM

## 2022-01-03 DIAGNOSIS — K30 Functional dyspepsia: Secondary | ICD-10-CM | POA: Diagnosis present

## 2022-01-03 DIAGNOSIS — T434X5A Adverse effect of butyrophenone and thiothixene neuroleptics, initial encounter: Secondary | ICD-10-CM | POA: Diagnosis present

## 2022-01-03 DIAGNOSIS — R4183 Borderline intellectual functioning: Secondary | ICD-10-CM | POA: Diagnosis present

## 2022-01-03 LAB — COMPREHENSIVE METABOLIC PANEL
ALT: 14 U/L (ref 0–44)
AST: 20 U/L (ref 15–41)
Albumin: 3.8 g/dL (ref 3.5–5.0)
Alkaline Phosphatase: 170 U/L — ABNORMAL HIGH (ref 38–126)
Anion gap: 7 (ref 5–15)
BUN: 21 mg/dL (ref 8–23)
CO2: 21 mmol/L — ABNORMAL LOW (ref 22–32)
Calcium: 7.9 mg/dL — ABNORMAL LOW (ref 8.9–10.3)
Chloride: 111 mmol/L (ref 98–111)
Creatinine, Ser: 1.64 mg/dL — ABNORMAL HIGH (ref 0.61–1.24)
GFR, Estimated: 47 mL/min — ABNORMAL LOW (ref >60)
Glucose, Bld: 88 mg/dL (ref 70–99)
Potassium: 4.6 mmol/L (ref 3.5–5.1)
Sodium: 139 mmol/L (ref 135–145)
Total Bilirubin: 0.5 mg/dL (ref 0.3–1.2)
Total Protein: 7.6 g/dL (ref 6.5–8.1)

## 2022-01-03 LAB — CBC WITH DIFFERENTIAL/PLATELET
Abs Immature Granulocytes: 0.02 10*3/uL (ref 0.00–0.07)
Basophils Absolute: 0 10*3/uL (ref 0.0–0.1)
Basophils Relative: 0 %
Eosinophils Absolute: 0.1 10*3/uL (ref 0.0–0.5)
Eosinophils Relative: 2 %
HCT: 47.6 % (ref 39.0–52.0)
Hemoglobin: 15.1 g/dL (ref 13.0–17.0)
Immature Granulocytes: 0 %
Lymphocytes Relative: 24 %
Lymphs Abs: 1.1 10*3/uL (ref 0.7–4.0)
MCH: 30.1 pg (ref 26.0–34.0)
MCHC: 31.7 g/dL (ref 30.0–36.0)
MCV: 94.8 fL (ref 80.0–100.0)
Monocytes Absolute: 0.3 10*3/uL (ref 0.1–1.0)
Monocytes Relative: 7 %
Neutro Abs: 3.1 10*3/uL (ref 1.7–7.7)
Neutrophils Relative %: 67 %
Platelets: 211 10*3/uL (ref 150–400)
RBC: 5.02 MIL/uL (ref 4.22–5.81)
RDW: 13.3 % (ref 11.5–15.5)
WBC: 4.6 10*3/uL (ref 4.0–10.5)
nRBC: 0 % (ref 0.0–0.2)

## 2022-01-03 LAB — VITAMIN D 25 HYDROXY (VIT D DEFICIENCY, FRACTURES): Vit D, 25-Hydroxy: 27.89 ng/mL — ABNORMAL LOW (ref 30–100)

## 2022-01-03 LAB — MAGNESIUM: Magnesium: 2.5 mg/dL — ABNORMAL HIGH (ref 1.7–2.4)

## 2022-01-03 LAB — URINALYSIS, ROUTINE W REFLEX MICROSCOPIC
Bilirubin Urine: NEGATIVE
Glucose, UA: NEGATIVE mg/dL
Hgb urine dipstick: NEGATIVE
Ketones, ur: NEGATIVE mg/dL
Leukocytes,Ua: NEGATIVE
Nitrite: NEGATIVE
Protein, ur: NEGATIVE mg/dL
Specific Gravity, Urine: 1.028 (ref 1.005–1.030)
pH: 6 (ref 5.0–8.0)

## 2022-01-03 LAB — TROPONIN I (HIGH SENSITIVITY)
Troponin I (High Sensitivity): 10 ng/L (ref <18)
Troponin I (High Sensitivity): 9 ng/L (ref <18)

## 2022-01-03 LAB — POC OCCULT BLOOD, ED: Fecal Occult Bld: NEGATIVE

## 2022-01-03 LAB — GAMMA GT: GGT: 42 U/L (ref 7–50)

## 2022-01-03 LAB — LITHIUM LEVEL: Lithium Lvl: 1.04 mmol/L (ref 0.60–1.20)

## 2022-01-03 LAB — AMMONIA: Ammonia: 26 umol/L (ref 9–35)

## 2022-01-03 LAB — VALPROIC ACID LEVEL: Valproic Acid Lvl: 34 ug/mL — ABNORMAL LOW (ref 50.0–100.0)

## 2022-01-03 MED ORDER — ENOXAPARIN SODIUM 40 MG/0.4ML IJ SOSY
40.0000 mg | PREFILLED_SYRINGE | INTRAMUSCULAR | Status: DC
  Administered 2022-01-03 – 2022-01-15 (×13): 40 mg via SUBCUTANEOUS
  Filled 2022-01-03 (×12): qty 0.4

## 2022-01-03 MED ORDER — POLYETHYLENE GLYCOL 3350 17 G PO PACK
17.0000 g | PACK | Freq: Every day | ORAL | Status: DC
  Administered 2022-01-03 – 2022-01-10 (×8): 17 g via ORAL
  Filled 2022-01-03 (×9): qty 1

## 2022-01-03 MED ORDER — ONDANSETRON HCL 4 MG/2ML IJ SOLN
4.0000 mg | Freq: Three times a day (TID) | INTRAMUSCULAR | Status: DC | PRN
  Administered 2022-01-11 – 2022-01-14 (×3): 4 mg via INTRAVENOUS
  Filled 2022-01-03 (×4): qty 2

## 2022-01-03 MED ORDER — SODIUM CHLORIDE (PF) 0.9 % IJ SOLN
INTRAMUSCULAR | Status: AC
  Filled 2022-01-03: qty 50

## 2022-01-03 MED ORDER — IOHEXOL 300 MG/ML  SOLN
75.0000 mL | Freq: Once | INTRAMUSCULAR | Status: AC | PRN
  Administered 2022-01-03: 75 mL via INTRAVENOUS

## 2022-01-03 MED ORDER — CALCIUM GLUCONATE-NACL 1-0.675 GM/50ML-% IV SOLN
1.0000 g | Freq: Once | INTRAVENOUS | Status: AC
  Administered 2022-01-03: 1000 mg via INTRAVENOUS
  Filled 2022-01-03: qty 50

## 2022-01-03 MED ORDER — SODIUM CHLORIDE 0.9 % IV BOLUS
1000.0000 mL | Freq: Once | INTRAVENOUS | Status: AC
  Administered 2022-01-03: 1000 mL via INTRAVENOUS

## 2022-01-03 MED ORDER — HYDRALAZINE HCL 20 MG/ML IJ SOLN: 10.0000 mg | Freq: Three times a day (TID) | INTRAMUSCULAR | Status: DC | PRN

## 2022-01-03 MED ORDER — LACTATED RINGERS IV SOLN: INTRAVENOUS | Status: DC

## 2022-01-03 MED ORDER — SORBITOL 70 % SOLN
400.0000 mL | TOPICAL_OIL | Freq: Once | ORAL | Status: AC
  Administered 2022-01-03: 400 mL via RECTAL
  Filled 2022-01-03: qty 120

## 2022-01-03 MED ORDER — SENNOSIDES-DOCUSATE SODIUM 8.6-50 MG PO TABS
1.0000 | ORAL_TABLET | Freq: Two times a day (BID) | ORAL | Status: DC
Start: 2022-01-03 — End: 2022-01-03

## 2022-01-03 MED ORDER — SENNOSIDES-DOCUSATE SODIUM 8.6-50 MG PO TABS
1.0000 | ORAL_TABLET | Freq: Two times a day (BID) | ORAL | Status: DC
  Administered 2022-01-03 – 2022-01-09 (×12): 1 via ORAL
  Filled 2022-01-03 (×12): qty 1

## 2022-01-03 NOTE — ED Provider Notes (Signed)
[facility] DEPT Provider Note   CSN: [id number] Arrival date & time: 01/03/22  7253     History  Chief Complaint  Patient presents with   Fatigue   Weakness   Constipation    Nicholas Oconnell is a 61 y.o. male.  Level 5 caveat for psychiatric illness.  Patient brought in by caregiver with concern for elevated lithium level.  They state he has not been able to walk for the past 3 to 4 days and very unsteady on his feet but not fallen.  He also has had poor p.o. intake and no bowel movement for 3 days as well.  He normally goes every day.  Denies any abdominal pain, fever or vomiting.  No chest pain or shortness of breath.  He is at his baseline mental status and more confused than usual.  Caregiver concerned that his lithium level could be higher he is dehydrated.  Patient denies any dizziness, headache, chest pain, shortness of breath, abdominal pain, vomiting, diarrhea.  No pain with urination or blood in the urine.  Patient does not know why he is here.  The history is provided by the patient and a caregiver.  Weakness Constipation      Home Medications Prior to Admission medications   Medication Sig Start Date End Date Taking? Authorizing Provider  amLODipine (NORVASC) 10 MG tablet Take 10 mg by mouth daily.    [provider]  divalproex (DEPAKOTE ER) 500 MG 24 hr tablet Take 2 tablets (1,000 mg total) by mouth at bedtime. 04/28/21   Isla Pence, MD  Ensure (ENSURE) Take by mouth 2 (two) times daily as needed (if 50% of meal not consumed).    [provider]  haloperidol (HALDOL) 20 MG tablet Take 20 mg by mouth 3 (three) times daily.    [provider]  lactulose (CHRONULAC) 10 GM/15ML solution Take 30 mLs by mouth 3 (three) times daily as needed for constipation. 06/12/21   [provider]  lithium carbonate 150 MG capsule Take 150-300 mg by mouth See admin instructions. Take one capsule (150 mg) by mouth every  morning and 2 capsules (300 mg) at bedtime    [provider]  Melatonin 10 MG CAPS Take 10 mg by mouth at bedtime.    [provider]  mineral oil enema Place 1 enema rectally daily as needed for severe constipation.    [provider]  pantoprazole (PROTONIX) 40 MG tablet Take 40 mg by mouth 2 (two) times daily.    [provider]  polyethylene glycol (MIRALAX / GLYCOLAX) 17 g packet Take 17 g by mouth 2 (two) times daily.    [provider]  propranolol (INDERAL) 10 MG tablet Take 10 mg by mouth 3 (three) times daily.    [provider]  QUEtiapine (SEROQUEL) 400 MG tablet Take 400 mg by mouth 2 (two) times daily. 05/18/20   [provider]  simethicone (MYLICON) 664 MG chewable tablet Chew 125 mg by mouth every 6 (six) hours as needed for flatulence.    [provider]  zolpidem (AMBIEN) 10 MG tablet Take 10 mg by mouth at bedtime. 06/21/20   [provider]      Allergies    Benzodiazepines and Clozapine    Review of Systems   Review of Systems  Gastrointestinal:  Positive for constipation.  Neurological:  Positive for weakness.    Physical Exam Updated Vital Signs BP (!) 146/109 (BP Location: Left Arm)  Pulse (!) 57   Temp 97.6 F (36.4 C) (Oral)   Resp 16   Ht '5\' 8"'$  (1.727 m)   SpO2 98%   BMI 23.57 kg/m  Physical Exam Vitals and nursing note reviewed.  Constitutional:      General: He is not in acute distress.    Appearance: He is well-developed.     Comments: Oriented to person and place  HENT:     Head: Normocephalic and atraumatic.     Mouth/Throat:     Pharynx: No oropharyngeal exudate.  Eyes:     Conjunctiva/sclera: Conjunctivae normal.     Pupils: Pupils are equal, round, and reactive to light.  Neck:     Comments: No meningismus. Cardiovascular:     Rate and Rhythm: Normal rate and regular rhythm.     Heart sounds: Normal heart sounds. No murmur heard. Pulmonary:      Effort: Pulmonary effort is normal. No respiratory distress.     Breath sounds: Normal breath sounds.  Chest:     Chest wall: No tenderness.  Abdominal:     General: There is distension.     Palpations: Abdomen is soft.     Tenderness: There is abdominal tenderness. There is no guarding or rebound.     Comments: Multiple surgical scars. Diffuse tenderness. No guarding or rebound  Genitourinary:    Comments: No fecal impaction Musculoskeletal:        General: No tenderness. Normal range of motion.     Cervical back: Normal range of motion and neck supple.  Skin:    General: Skin is warm.  Neurological:     Mental Status: He is alert.     Cranial Nerves: No cranial nerve deficit.     Motor: No abnormal muscle tone.     Coordination: Coordination normal.     Comments: No facial droop, tongue is midline.  Tremors of arms and legs bilaterally which are at baseline per caregiver.  No clonus. Able to lift arms and legs off the bed without difficulty but appears globally weak.  Psychiatric:        Behavior: Behavior normal.     ED Results / Procedures / Treatments   Labs (all labs ordered are listed, but only abnormal results are displayed) Labs Reviewed  COMPREHENSIVE METABOLIC PANEL - Abnormal; Notable for the following components:      Result Value   CO2 21 (*)    Creatinine, Ser 1.64 (*)    Calcium 7.9 (*)    Alkaline Phosphatase 170 (*)    GFR, Estimated 47 (*)    All other components within normal limits  VALPROIC ACID LEVEL - Abnormal; Notable for the following components:   Valproic Acid Lvl 34 (*)    All other components within normal limits  CBC WITH DIFFERENTIAL/PLATELET  LITHIUM LEVEL  AMMONIA  URINALYSIS, ROUTINE W REFLEX MICROSCOPIC  PTH, INTACT AND CALCIUM  VITAMIN D 25 HYDROXY (VIT D DEFICIENCY, FRACTURES)  GAMMA GT  MAGNESIUM  COMPREHENSIVE METABOLIC PANEL  CBC  POC OCCULT BLOOD, ED  TROPONIN I (HIGH SENSITIVITY)  TROPONIN I (HIGH SENSITIVITY)     EKG EKG Interpretation  Date/Time:  Wednesday January 03 2022 10:59:41 EDT Ventricular Rate:  59 PR Interval:  177 QRS Duration: 82 QT Interval:  428 QTC Calculation: 424 R Axis:   35 Text Interpretation: Sinus rhythm Abnormal T, consider ischemia, lateral leads Borderline ST elevation, anterior leads No significant change was found Confirmed by Ezequiel Essex [phone]) on 01/03/2022 11:23:56  AM  Radiology CT ABDOMEN PELVIS W CONTRAST  Result Date: 01/03/2022 CLINICAL DATA:  Generalized weakness, abdominal pain, decreased appetite, constipation EXAM: CT ABDOMEN AND PELVIS WITH CONTRAST TECHNIQUE: Multidetector CT imaging of the abdomen and pelvis was performed using the standard protocol following bolus administration of intravenous contrast. RADIATION DOSE REDUCTION: This exam was performed according to the departmental dose-optimization program which includes automated exposure control, adjustment of the mA and/or kV according to patient size and/or use of iterative reconstruction technique. CONTRAST:  15m OMNIPAQUE IOHEXOL 300 MG/ML  SOLN COMPARISON:  06/28/2021 FINDINGS: Lower chest: No acute abnormality. Hepatobiliary: No solid liver abnormality is seen. No gallstones, gallbladder wall thickening, or biliary dilatation. Pancreas: Unremarkable. No pancreatic ductal dilatation or surrounding inflammatory changes. Spleen: Normal in size without significant abnormality. Adrenals/Urinary Tract: Adrenal glands are unremarkable. Kidneys are normal, without renal calculi, solid lesion, or hydronephrosis. Bladder is unremarkable. Stomach/Bowel: Stomach is within normal limits. Evidence of partial left colon resection and reanastomosis. Extremely large burden of stool in the distal colon, stool balls in the distal colon and rectum measuring up to 7.2 cm. Vascular/Lymphatic: No significant vascular findings are present. No enlarged abdominal or pelvic lymph nodes. Reproductive: No mass or other  significant abnormality. Other: No abdominal wall hernia or abnormality. No ascites. Musculoskeletal: No acute or significant osseous findings. IMPRESSION: 1. Extremely large burden of stool and large stool balls in the distal colon and rectum. 2. Evidence of partial left colon resection and reanastomosis. Electronically Signed   By: ADelanna AhmadiM.D.   On: 01/03/2022 12:31   CT Head Wo Contrast  Result Date: 01/03/2022 CLINICAL DATA:  Generalized weakness, fatigue, decreased appetite. EXAM: CT HEAD WITHOUT CONTRAST TECHNIQUE: Contiguous axial images were obtained from the base of the skull through the vertex without intravenous contrast. RADIATION DOSE REDUCTION: This exam was performed according to the departmental dose-optimization program which includes automated exposure control, adjustment of the mA and/or kV according to patient size and/or use of iterative reconstruction technique. COMPARISON:  CT head 12/12/2018 FINDINGS: Brain: There is no acute intracranial hemorrhage, extra-axial fluid collection, or acute infarct. Parenchymal volume is normal. The ventricles are stable in size. Gray-white differentiation is preserved There is no mass lesion.  There is no mass effect or midline shift. Vascular: No hyperdense vessel or unexpected calcification. Skull: Normal. Negative for fracture or focal lesion. Sinuses/Orbits: The paranasal sinuses are clear. The globes and orbits are unremarkable. Other: None. IMPRESSION: No acute intracranial pathology. Electronically Signed   By: PValetta MoleM.D.   On: 01/03/2022 10:37   DG ABD ACUTE 2+V W 1V CHEST  Result Date: 01/03/2022 CLINICAL DATA:  Abdominal pain. Generalized weakness, fatigue, and decreased appetite. EXAM: DG ABDOMEN ACUTE WITH 1 VIEW CHEST COMPARISON:  AP chest 11/08/2021; KUB 07/05/2021; CT abdomen and pelvis FINDINGS: Mildly decreased lung volumes. Cardiac silhouette and mediastinal contours are within normal limits. The lungs are clear. No  pleural effusion or pneumothorax. Mild multilevel degenerative disc changes of the thoracic spine. Surgical clips overlie the left upper abdominal quadrant. The sigmoid colon is highly distended with air, however this appears similar to 07/05/2021 frontal radiograph. There is also new high-grade stool within the rectum and sigmoid colon concerning for fecal impaction. High-grade stool is also seen throughout the ascending and transverse colon. Note is made the patient appears to be status post partial colectomy on prior CT. No definite dilated loops of small bowel to indicate bowel obstruction. Vascular phleboliths again overlie the right hemipelvis. No subdiaphragmatic free  air is seen. IMPRESSION: Note is made of postsurgical change of partial colectomy on prior CT. There is air-filled distention of the postsurgical colon which is very similar to multiple prior radiographs and 06/28/2021 CT. There is also high-grade stool seen within the rectum and postsurgical remaining colon. Findings are suspicious for constipation and possible rectal impaction. Electronically Signed   By: Yvonne Kendall M.D.   On: 01/03/2022 10:08    Procedures Procedures    Medications Ordered in ED Medications  sodium chloride 0.9 % bolus 1,000 mL (has no administration in time range)    ED Course/ Medical Decision Making/ A&P                           Medical Decision Making Amount and/or Complexity of Data Reviewed Labs: ordered. Decision-making details documented in ED Course. Radiology: ordered and independent interpretation performed. Decision-making details documented in ED Course. ECG/medicine tests: ordered and independent interpretation performed. Decision-making details documented in ED Course.  Risk Prescription drug management. Decision regarding hospitalization.  4 days of generalized weakness with fatigue, not able to walk, poor appetite and no bowel movement.  Caregiver concern for elevated lithium  level.  Vitals are stable, no distress, no focal neurological deficits  Abdomen is distended but apparently nontender.  No peritoneal signs.  Patient given IV fluids and labs will be obtained.  Ammonia levels normal, Depakote level subtherapeutic.  X-rays concerning for pseudo obstruction with large stool in rectum that is not reachable with a finger.  No free air.  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  Discussed with Dr. Collene Mares of gastroenterology who will consult and agrees with CT scan and possibly NG tube with IV fluids.  Creatinine is at baseline.  Lithium level is within therapeutic range. CT head is stable.  No acute findings.  Results reviewed and interpreted by me.  Admission discussed with Dr. Marylyn Ishihara.  He will admit patient.  CT abdomen pelvis is pending.          Final Clinical Impression(s) / ED Diagnoses Final diagnoses:  Pseudo-obstruction of colon  Generalized weakness    Rx / DC Orders ED Discharge Orders     None         Luisantonio Adinolfi, Annie Main, MD 01/03/22 [phone]

## 2022-01-03 NOTE — ED Triage Notes (Signed)
Pt arrives from a Group home with his caregiver. Caregiver states that he has had generalized weakness, fatigue, decreased appetite, and constipation over the past few days.

## 2022-01-03 NOTE — ED Notes (Signed)
Bladder scan completed with 162m noted in bladder.

## 2022-01-03 NOTE — Consult Note (Signed)
UNASSIGNED PATIENT Reason for Consult: Severe chronic constipation with abnormal CT scan. Referring Physician: [facility] hospitalist.  Nicholas Oconnell is an 60 y.o. male.  HPI: 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 Patient has a schizoaffective disorder and cannot give me any details on his history.  He denies having abdominal pain nausea vomiting at this time. A CT of the abdomen pelvis done in the emergency revealed extremely large stool burden with large stool ball in the distal colon and rectum which could not be disimpacted by the ER physician. There was evidence of left colonic resection with reanastomosis. Patient was hospitalized in February this year with similar symptoms with acute pseudoobstruction which resolved with NG decompression MiraLAX.  His alkaline phosphatase was noted to be elevated 170 but the rest the liver panel was normal  Past Medical History:  Diagnosis Date   Anxiety    Bipolar affective ([place])    Cancer ([place])    Chronic kidney disease (CKD)    Cognitive deficits    Hypertension    Seizure ([place])    History reviewed. No pertinent surgical history.  History reviewed. No pertinent family history.  Social History:  reports that he has never smoked. He has never used smokeless tobacco. He reports that he does not drink alcohol and does not use drugs.  Allergies:  Allergies  Allergen Reactions   Benzodiazepines Other (See Comments)    disinhibition Not on MAR   Clozapine Other (See Comments)    constipation - bowel obstruction Not on MAR   Medications: I have reviewed the patient's current medications. Prior to Admission:  Medications Prior to Admission  Medication Sig Dispense Refill Last Dose   aMILoride (MIDAMOR) 5 MG tablet Take 5 mg by mouth daily.   01/03/2022   divalproex  (DEPAKOTE ER) 500 MG 24 hr tablet Take 2 tablets (1,000 mg total) by mouth at bedtime. 60 tablet 0 01/02/2022   Ensure (ENSURE) Take by mouth 2 (two) times daily as needed (if 50% of meal not consumed).   unk   haloperidol (HALDOL) 20 MG tablet Take 20 mg by mouth 3 (three) times daily.   01/03/2022   lactulose (CHRONULAC) 10 GM/15ML solution Take 20 g by mouth 3 (three) times daily as needed for constipation.   unk   lithium carbonate 150 MG capsule Take 150-300 mg by mouth See admin instructions. Take 150 mg by mouth every morning. Take 300 mg by mouth at bedtime.   01/03/2022   Melatonin 10 MG CAPS Take 10 mg by mouth at bedtime.   01/02/2022   pantoprazole (PROTONIX) 40 MG tablet Take 40 mg by mouth 2 (two) times daily.   01/03/2022   Polyethylene Glycol 3350 POWD Take 17 g by mouth in the morning and at bedtime.   01/03/2022   propranolol (INDERAL) 10 MG tablet Take 10 mg by mouth 3 (three) times daily.   01/03/2022 at 0800   QUEtiapine (SEROQUEL) 400 MG tablet Take 400 mg by mouth 2 (two) times daily.   01/03/2022   senna-docusate (SENNA S) 8.6-50 MG tablet Take 2 tablets by mouth daily.   01/03/2022   simethicone (MYLICON) 580 MG chewable tablet Chew 125 mg by mouth every 6 (six) hours as needed for flatulence.   unk   Sodium Phosphates (ENEMA) ENEM Place 1 Dose rectally daily as needed (constipation).  unk   VELTASSA 8.4 g packet Take 8.4 g by mouth daily.   01/03/2022   zolpidem (AMBIEN) 10 MG tablet Take 10 mg by mouth at bedtime.   01/02/2022   amLODipine (NORVASC) 10 MG tablet Take 10 mg by mouth daily. (Patient not taking: Reported on 01/03/2022)   Not Taking   mineral oil enema Place 1 enema rectally daily as needed for severe constipation. (Patient not taking: Reported on 01/03/2022)   Not Taking   polyethylene glycol (MIRALAX / GLYCOLAX) 17 g packet Take 17 g by mouth 2 (two) times daily. (Patient not taking: Reported on 01/03/2022)   Not Taking   Scheduled:  enoxaparin (LOVENOX) injection  40 mg  Subcutaneous Q24H   senna-docusate  1 tablet Oral BID   Continuous:  calcium gluconate     lactated ringers 100 mL/hr at 01/03/22 1504   LOV:FIEPPIRJJOA, ondansetron (ZOFRAN) IV  Results for orders placed or performed during the hospital encounter of 01/03/22 (from the past 48 hour(s))  Valproic acid level     Status: Abnormal   Collection Time: 01/03/22  8:42 AM  Result Value Ref Range   Valproic Acid Lvl 34 (L) 50.0 - 100.0 ug/mL    Comment: Performed at [facility], [place] [address], [place], [place] [address]  Ammonia     Status: None   Collection Time: 01/03/22  8:42 AM  Result Value Ref Range   Ammonia 26 9 - 35 umol/L    Comment: Performed at [facility], [place] [address] [place], [place] [address]  POC occult blood, ED     Status: None   Collection Time: 01/03/22  9:10 AM  Result Value Ref Range   Fecal Occult Bld NEGATIVE NEGATIVE  CBC with Differential     Status: None   Collection Time: 01/03/22  9:28 AM  Result Value Ref Range   WBC 4.6 4.0 - 10.5 K/uL   RBC 5.02 4.22 - 5.81 MIL/uL   Hemoglobin 15.1 13.0 - 17.0 g/dL   HCT 47.6 39.0 - 52.0 %   MCV 94.8 80.0 - 100.0 fL   MCH 30.1 26.0 - 34.0 pg   MCHC 31.7 30.0 - 36.0 g/dL   RDW 13.3 11.5 - 15.5 %   Platelets 211 150 - 400 K/uL   nRBC 0.0 0.0 - 0.2 %   Neutrophils Relative % 67 %   Neutro Abs 3.1 1.7 - 7.7 K/uL   Lymphocytes Relative 24 %   Lymphs Abs 1.1 0.7 - 4.0 K/uL   Monocytes Relative 7 %   Monocytes Absolute 0.3 0.1 - 1.0 K/uL   Eosinophils Relative 2 %   Eosinophils Absolute 0.1 0.0 - 0.5 K/uL   Basophils Relative 0 %   Basophils Absolute 0.0 0.0 - 0.1 K/uL   Immature Granulocytes 0 %   Abs Immature Granulocytes 0.02 0.00 - 0.07 K/uL    Comment: Performed at [facility], [place] [address], [place], [place] [address]  Comprehensive metabolic panel     Status: Abnormal   Collection Time: 01/03/22  9:28 AM  Result Value Ref Range   Sodium 139 135  - 145 mmol/L   Potassium 4.6 3.5 - 5.1 mmol/L   Chloride 111 98 - 111 mmol/L   CO2 21 (L) 22 - 32 mmol/L   Glucose, Bld 88 70 - 99 mg/dL    Comment: Glucose reference range applies only to samples taken after fasting for at least 8 hours.   BUN  21 8 - 23 mg/dL   Creatinine, Ser 1.64 (H) 0.61 - 1.24 mg/dL   Calcium 7.9 (L) 8.9 - 10.3 mg/dL   Total Protein 7.6 6.5 - 8.1 g/dL   Albumin 3.8 3.5 - 5.0 g/dL   AST 20 15 - 41 U/L   ALT 14 0 - 44 U/L   Alkaline Phosphatase 170 (H) 38 - 126 U/L   Total Bilirubin 0.5 0.3 - 1.2 mg/dL   GFR, Estimated 47 (L) >60 mL/min    Comment: (NOTE) Calculated using the CKD-EPI Creatinine Equation (2021)    Anion gap 7 5 - 15    Comment: Performed at [facility], [place] [address] [place], [place] [address]  Troponin I (High Sensitivity)     Status: None   Collection Time: 01/03/22  9:28 AM  Result Value Ref Range   Troponin I (High Sensitivity) 10 <18 ng/L    Comment: (NOTE) Elevated high sensitivity troponin I (hsTnI) values and significant  changes across serial measurements may suggest ACS but many other  chronic and acute conditions are known to elevate hsTnI results.  Refer to the "Links" section for chest pain algorithms and additional  guidance. Performed at [facility] [place] [address] [place], [place] [address]   Lithium level     Status: None   Collection Time: 01/03/22 10:43 AM  Result Value Ref Range   Lithium Lvl 1.04 0.60 - 1.20 mmol/L    Comment: Performed at [place] [address], [place], [place] [address]    CT ABDOMEN PELVIS W CONTRAST  Result Date: 01/03/2022 CLINICAL DATA:  Generalized weakness, abdominal pain, decreased appetite, constipation EXAM: CT ABDOMEN AND PELVIS WITH CONTRAST TECHNIQUE: Multidetector CT imaging of the abdomen and pelvis was performed using the standard protocol following bolus administration of intravenous contrast. RADIATION DOSE REDUCTION: This exam  was performed according to the departmental dose-optimization program which includes automated exposure control, adjustment of the mA and/or kV according to patient size and/or use of iterative reconstruction technique. CONTRAST:  62m OMNIPAQUE IOHEXOL 300 MG/ML  SOLN COMPARISON:  06/28/2021 FINDINGS: Lower chest: No acute abnormality. Hepatobiliary: No solid liver abnormality is seen. No gallstones, gallbladder wall thickening, or biliary dilatation. Pancreas: Unremarkable. No pancreatic ductal dilatation or surrounding inflammatory changes. Spleen: Normal in size without significant abnormality. Adrenals/Urinary Tract: Adrenal glands are unremarkable. Kidneys are normal, without renal calculi, solid lesion, or hydronephrosis. Bladder is unremarkable. Stomach/Bowel: Stomach is within normal limits. Evidence of partial left colon resection and reanastomosis. Extremely large burden of stool in the distal colon, stool balls in the distal colon and rectum measuring up to 7.2 cm. Vascular/Lymphatic: No significant vascular findings are present. No enlarged abdominal or pelvic lymph nodes. Reproductive: No mass or other significant abnormality. Other: No abdominal wall hernia or abnormality. No ascites. Musculoskeletal: No acute or significant osseous findings. IMPRESSION: 1. Extremely large burden of stool and large stool balls in the distal colon and rectum. 2. Evidence of partial left colon resection and reanastomosis. Electronically Signed   By: ADelanna AhmadiM.D.   On: 01/03/2022 12:31   CT Head Wo Contrast  Result Date: 01/03/2022 CLINICAL DATA:  Generalized weakness, fatigue, decreased appetite. EXAM: CT HEAD WITHOUT CONTRAST TECHNIQUE: Contiguous axial images were obtained from the base of the skull through the vertex without intravenous contrast. RADIATION DOSE REDUCTION: This exam was performed according to the departmental dose-optimization program which includes automated exposure control, adjustment of  the mA and/or kV according  to patient size and/or use of iterative reconstruction technique. COMPARISON:  CT head 12/12/2018 FINDINGS: Brain: There is no acute intracranial hemorrhage, extra-axial fluid collection, or acute infarct. Parenchymal volume is normal. The ventricles are stable in size. Gray-white differentiation is preserved There is no mass lesion.  There is no mass effect or midline shift. Vascular: No hyperdense vessel or unexpected calcification. Skull: Normal. Negative for fracture or focal lesion. Sinuses/Orbits: The paranasal sinuses are clear. The globes and orbits are unremarkable. Other: None. IMPRESSION: No acute intracranial pathology. Electronically Signed   By: Valetta Mole M.D.   On: 01/03/2022 10:37   DG ABD ACUTE 2+V W 1V CHEST  Result Date: 01/03/2022 CLINICAL DATA:  Abdominal pain. Generalized weakness, fatigue, and decreased appetite. EXAM: DG ABDOMEN ACUTE WITH 1 VIEW CHEST COMPARISON:  AP chest 11/08/2021; KUB 07/05/2021; CT abdomen and pelvis FINDINGS: Mildly decreased lung volumes. Cardiac silhouette and mediastinal contours are within normal limits. The lungs are clear. No pleural effusion or pneumothorax. Mild multilevel degenerative disc changes of the thoracic spine. Surgical clips overlie the left upper abdominal quadrant. The sigmoid colon is highly distended with air, however this appears similar to 07/05/2021 frontal radiograph. There is also new high-grade stool within the rectum and sigmoid colon concerning for fecal impaction. High-grade stool is also seen throughout the ascending and transverse colon. Note is made the patient appears to be status post partial colectomy on prior CT. No definite dilated loops of small bowel to indicate bowel obstruction. Vascular phleboliths again overlie the right hemipelvis. No subdiaphragmatic free air is seen. IMPRESSION: Note is made of postsurgical change of partial colectomy on prior CT. There is air-filled distention of the  postsurgical colon which is very similar to multiple prior radiographs and 06/28/2021 CT. There is also high-grade stool seen within the rectum and postsurgical remaining colon. Findings are suspicious for constipation and possible rectal impaction. Electronically Signed   By: Yvonne Kendall M.D.   On: 01/03/2022 10:08    Review of Systems  Unable to perform ROS: Psychiatric disorder   Blood pressure (!) 139/95, pulse 65, temperature 97.8 F (36.6 C), temperature source Oral, resp. rate 14, height '5\' 8"'$  (1.727 m), SpO2 98 %. Physical Exam Constitutional:      General: He is not in acute distress.    Appearance: Normal appearance. He is obese.  HENT:     Head: Atraumatic.     Mouth/Throat:     Mouth: Mucous membranes are moist.  Eyes:     Extraocular Movements: Extraocular movements intact.     Pupils: Pupils are equal, round, and reactive to light.  Cardiovascular:     Rate and Rhythm: Normal rate and regular rhythm.     Pulses: Normal pulses.     Heart sounds: Normal heart sounds.  Pulmonary:     Effort: Pulmonary effort is normal.     Breath sounds: Normal breath sounds.  Abdominal:     General: There is distension.     Tenderness: There is no abdominal tenderness. There is no guarding.  Musculoskeletal:        General: Normal range of motion.     Cervical back: Neck supple.  Skin:    General: Skin is warm and dry.  Neurological:     General: No focal deficit present.   Assessment/Plan: 1) Chronic constipation-abnormal CT scan of the abdomen pelvis revealing multiple stools in the rectum and a large stool burden-patient denies having abdominal pain or nausea at this time and therefore  we will try to give him some MiraLAX orally and see if this helps his symptoms. SMOG rectal enemas might be helpful as well. 2) Stage IIIa chronic kidney disease. 3) Hypertension. 4) Elevated alkaline phosphatase probably secondary to medications. 5) Bipolar/schizoaffective disorder. 6)  Seizure disorder.  Juanita Craver 01/03/2022, 12:54 PM

## 2022-01-03 NOTE — H&P (Addendum)
History and Physical    Patient: Raidyn Wassink JSE:[id number] DOB: 01/22/1961 DOA: 01/03/2022 DOS: the patient was seen and examined on 01/03/2022 PCP: Neale Burly, MD  Patient coming from:  [place]  Chief Complaint:  Chief Complaint  Patient presents with   Fatigue   Weakness   Constipation   HPI: Yousof Alderman is a 61 y.o. male with medical history significant of Ogilvie syndrome, HTN, CKD3a, RCC s/p partial right nephrectomy, HTN, schizoaffective disorder. Presenting with generalized weakness, poor PO intake and constipation. History is from caregiver at bedside. He reports that the patient has 5 days of weakness and poor appetite. During this time, he has not had any fevers, urinary changes, or vomiting noted. He apparently has a history of tremors; and the weaker that he has felt, the more afraid of falling he has become. He has not had any witnessed falls during this time. His caregiver also reports that he has not had a BM in the last 2 days. When his weakness worsened this morning, they decided to bring him to the ED for evaluation.     Review of Systems: As mentioned in the history of present illness. All other systems reviewed and are negative. Past Medical History:  Diagnosis Date   Anxiety    Bipolar affective ([place])    Cancer ([place])    Chronic kidney disease (CKD)    Cognitive deficits    Hypertension    Seizure ([place])    History reviewed. No pertinent surgical history. Social History:  reports that he has never smoked. He has never used smokeless tobacco. He reports that he does not drink alcohol and does not use drugs.  Allergies  Allergen Reactions   Benzodiazepines Other (See Comments)    disinhibition Not on MAR   Clozapine Other (See Comments)    constipation - bowel obstruction Not on MAR    History reviewed. No pertinent family history.  Prior to Admission medications   Medication Sig Start Date End Date Taking? Authorizing Provider  amLODipine  (NORVASC) 10 MG tablet Take 10 mg by mouth daily.    [provider]  divalproex (DEPAKOTE ER) 500 MG 24 hr tablet Take 2 tablets (1,000 mg total) by mouth at bedtime. 04/28/21   Isla Pence, MD  Ensure (ENSURE) Take by mouth 2 (two) times daily as needed (if 50% of meal not consumed).    [provider]  haloperidol (HALDOL) 20 MG tablet Take 20 mg by mouth 3 (three) times daily.    [provider]  lactulose (CHRONULAC) 10 GM/15ML solution Take 30 mLs by mouth 3 (three) times daily as needed for constipation. 06/12/21   [provider]  lithium carbonate 150 MG capsule Take 150-300 mg by mouth See admin instructions. Take one capsule (150 mg) by mouth every morning and 2 capsules (300 mg) at bedtime    [provider]  Melatonin 10 MG CAPS Take 10 mg by mouth at bedtime.    [provider]  mineral oil enema Place 1 enema rectally daily as needed for severe constipation.    [provider]  pantoprazole (PROTONIX) 40 MG tablet Take 40 mg by mouth 2 (two) times daily.    [provider]  polyethylene glycol (MIRALAX / GLYCOLAX) 17 g packet Take 17 g by mouth 2 (two) times daily.    [provider]  propranolol (INDERAL) 10 MG tablet Take 10 mg by mouth 3 (three) times daily.    [provider]  QUEtiapine (SEROQUEL) 400 MG tablet Take 400 mg by mouth 2 (two) times daily. 05/18/20   [provider]  simethicone (MYLICON) 500 MG chewable tablet Chew 125 mg by mouth every 6 (six) hours as needed for flatulence.    [provider]  zolpidem (AMBIEN) 10 MG tablet Take 10 mg by mouth at bedtime. 06/21/20   [provider]    Physical Exam: Vitals:   01/03/22 0813 01/03/22 0818 01/03/22 1030  BP: (!) 146/109  (!) 147/92  Pulse: (!) 57  67  Resp: 16  16  Temp: 97.6 F (36.4 C)    TempSrc: Oral    SpO2: 98%  99%  Height:  _0  (1.727 m)    General: 60 y.o. male resting in bed in  NAD Eyes: PERRL, normal sclera ENMT: Nares patent w/o discharge, orophaynx clear, dentition normal, ears w/o discharge/lesions/ulcers Neck: Supple, trachea midline Cardiovascular: RRR, +S1, S2, no m/g/r, equal pulses throughout Respiratory: CTABL, no w/r/r, normal WOB GI: BS+, distended/tight, NT, no masses noted MSK: No e/c/c Skin: No rashes, bruises, ulcerations noted Neuro: A&O x 3 (name, place, month), no focal deficits Psyc: Appropriate interaction but flat affect, calm/cooperative  Data Reviewed:  Lab Results  Component Value Date   NA 139 01/03/2022   K 4.6 01/03/2022   CO2 21 (L) 01/03/2022   GLUCOSE 88 01/03/2022   BUN 21 01/03/2022   CREATININE 1.64 (H) 01/03/2022   CALCIUM 7.9 (L) 01/03/2022   GFRNONAA 47 (L) 01/03/2022   Albumin  3.8 Alk phos  170  Lab Results  Component Value Date   WBC 4.6 01/03/2022   HGB 15.1 01/03/2022   HCT 47.6 01/03/2022   MCV 94.8 01/03/2022   PLT 211 01/03/2022   XR abdomen IMPRESSION: Note is made of postsurgical change of partial colectomy on prior CT. There is air-filled distention of the postsurgical colon which is very similar to multiple prior radiographs and 06/28/2021 CT. There is also high-grade stool seen within the rectum and postsurgical remaining colon. Findings are suspicious for constipation and possible rectal impaction.  CT ab/pelvis w/ contrast IMPRESSION: 1. Extremely large burden of stool and large stool balls in the distal colon and rectum. 2. Evidence of partial left colon resection and reanastomosis.  EKG: brady, sinus, minimal ST elevation of V2  Assessment and Plan: Abdominal distention Constipation     - place in obs, med-surg     - CT as above     - GI (Dr. Collene Mares) consulted, appreciate assistance     - fluids, NPO except sips /meds for now until eval'd by GI     - add senokot     - check Mg2+ level  Generalized weakness     - likely secondary to above     - will have PT eval while he is  here  CKD3a     - just off baseline     - fluids, follow labs for now  HTN     - NPO until eval'd by GI     - will have PRN hydralazine     - resume home regimen when confirmed  Hypocalcemia     - albumin is 3.8, Ca2+ is 7.9 => corrects to 8.1     - replace Ca2+; check PTH, Vit D  Elevated alk phos     - check gtt, follow  Schizoaffective disorder Cognitive impairment     - continue home regimen when confirmed  Hx of tremor     -  continue home regimen when confirmed     - There is a 11/09/21 note from family medicine that mentions evaluation with neurology. 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 It looks like no action has been taken on this. Will defer to his outpatient psyc and neuro for his regimen going forward  Advance Care Planning:   Code Status: FULL  Consults: GI (Dr. Collene Mares)  Family Communication: w/ caregiver at bedside; attempted call to legal guardian but received VM only  Severity of Illness: The appropriate patient status for this patient is OBSERVATION. Observation status is judged to be reasonable and necessary in order to provide the required intensity of service to ensure the patient's safety. The patient's presenting symptoms, physical exam findings, and initial radiographic and laboratory data in the context of their medical condition is felt to place them at decreased risk for further clinical deterioration. Furthermore, it is anticipated that the patient will be medically stable for discharge from the hospital within 2 midnights of admission.   Author: Jonnie Finner, DO 01/03/2022 12:01 PM  For on call review [email or web address].

## 2022-01-04 DIAGNOSIS — N1831 Chronic kidney disease, stage 3a: Secondary | ICD-10-CM | POA: Diagnosis present

## 2022-01-04 DIAGNOSIS — I10 Essential (primary) hypertension: Secondary | ICD-10-CM | POA: Diagnosis not present

## 2022-01-04 DIAGNOSIS — I129 Hypertensive chronic kidney disease with stage 1 through stage 4 chronic kidney disease, or unspecified chronic kidney disease: Secondary | ICD-10-CM | POA: Diagnosis present

## 2022-01-04 DIAGNOSIS — K5981 Ogilvie syndrome: Secondary | ICD-10-CM | POA: Diagnosis present

## 2022-01-04 DIAGNOSIS — K30 Functional dyspepsia: Secondary | ICD-10-CM | POA: Diagnosis present

## 2022-01-04 DIAGNOSIS — T434X5A Adverse effect of butyrophenone and thiothixene neuroleptics, initial encounter: Secondary | ICD-10-CM | POA: Diagnosis present

## 2022-01-04 DIAGNOSIS — K567 Ileus, unspecified: Secondary | ICD-10-CM | POA: Diagnosis present

## 2022-01-04 DIAGNOSIS — Z8669 Personal history of other diseases of the nervous system and sense organs: Secondary | ICD-10-CM | POA: Diagnosis not present

## 2022-01-04 DIAGNOSIS — K5900 Constipation, unspecified: Secondary | ICD-10-CM | POA: Diagnosis not present

## 2022-01-04 DIAGNOSIS — Z888 Allergy status to other drugs, medicaments and biological substances status: Secondary | ICD-10-CM | POA: Diagnosis not present

## 2022-01-04 DIAGNOSIS — T43595A Adverse effect of other antipsychotics and neuroleptics, initial encounter: Secondary | ICD-10-CM | POA: Diagnosis present

## 2022-01-04 DIAGNOSIS — N183 Chronic kidney disease, stage 3 unspecified: Secondary | ICD-10-CM | POA: Diagnosis not present

## 2022-01-04 DIAGNOSIS — K5939 Other megacolon: Secondary | ICD-10-CM | POA: Diagnosis not present

## 2022-01-04 DIAGNOSIS — Z85528 Personal history of other malignant neoplasm of kidney: Secondary | ICD-10-CM | POA: Diagnosis not present

## 2022-01-04 DIAGNOSIS — F419 Anxiety disorder, unspecified: Secondary | ICD-10-CM | POA: Diagnosis present

## 2022-01-04 DIAGNOSIS — R4189 Other symptoms and signs involving cognitive functions and awareness: Secondary | ICD-10-CM | POA: Diagnosis present

## 2022-01-04 DIAGNOSIS — F250 Schizoaffective disorder, bipolar type: Secondary | ICD-10-CM | POA: Diagnosis present

## 2022-01-04 DIAGNOSIS — Z9049 Acquired absence of other specified parts of digestive tract: Secondary | ICD-10-CM | POA: Diagnosis not present

## 2022-01-04 DIAGNOSIS — Z905 Acquired absence of kidney: Secondary | ICD-10-CM | POA: Diagnosis not present

## 2022-01-04 DIAGNOSIS — K5641 Fecal impaction: Secondary | ICD-10-CM | POA: Diagnosis present

## 2022-01-04 DIAGNOSIS — G2401 Drug induced subacute dyskinesia: Secondary | ICD-10-CM | POA: Diagnosis present

## 2022-01-04 DIAGNOSIS — G2119 Other drug induced secondary parkinsonism: Secondary | ICD-10-CM | POA: Diagnosis present

## 2022-01-04 DIAGNOSIS — G40909 Epilepsy, unspecified, not intractable, without status epilepticus: Secondary | ICD-10-CM | POA: Diagnosis present

## 2022-01-04 DIAGNOSIS — R140 Abdominal distension (gaseous): Secondary | ICD-10-CM | POA: Diagnosis not present

## 2022-01-04 DIAGNOSIS — N179 Acute kidney failure, unspecified: Secondary | ICD-10-CM | POA: Diagnosis present

## 2022-01-04 DIAGNOSIS — R531 Weakness: Secondary | ICD-10-CM | POA: Diagnosis present

## 2022-01-04 DIAGNOSIS — Z79899 Other long term (current) drug therapy: Secondary | ICD-10-CM | POA: Diagnosis not present

## 2022-01-04 DIAGNOSIS — R4183 Borderline intellectual functioning: Secondary | ICD-10-CM | POA: Diagnosis present

## 2022-01-04 DIAGNOSIS — F319 Bipolar disorder, unspecified: Secondary | ICD-10-CM | POA: Diagnosis not present

## 2022-01-04 DIAGNOSIS — R748 Abnormal levels of other serum enzymes: Secondary | ICD-10-CM | POA: Diagnosis present

## 2022-01-04 LAB — CBC
HCT: 40.4 % (ref 39.0–52.0)
Hemoglobin: 13.1 g/dL (ref 13.0–17.0)
MCH: 30.5 pg (ref 26.0–34.0)
MCHC: 32.4 g/dL (ref 30.0–36.0)
MCV: 94 fL (ref 80.0–100.0)
Platelets: 243 10*3/uL (ref 150–400)
RBC: 4.3 MIL/uL (ref 4.22–5.81)
RDW: 13.2 % (ref 11.5–15.5)
WBC: 5.8 10*3/uL (ref 4.0–10.5)
nRBC: 0 % (ref 0.0–0.2)

## 2022-01-04 LAB — COMPREHENSIVE METABOLIC PANEL
ALT: 13 U/L (ref 0–44)
AST: 17 U/L (ref 15–41)
Albumin: 3.1 g/dL — ABNORMAL LOW (ref 3.5–5.0)
Alkaline Phosphatase: 139 U/L — ABNORMAL HIGH (ref 38–126)
Anion gap: 6 (ref 5–15)
BUN: 16 mg/dL (ref 8–23)
CO2: 21 mmol/L — ABNORMAL LOW (ref 22–32)
Calcium: 8.7 mg/dL — ABNORMAL LOW (ref 8.9–10.3)
Chloride: 111 mmol/L (ref 98–111)
Creatinine, Ser: 1.53 mg/dL — ABNORMAL HIGH (ref 0.61–1.24)
GFR, Estimated: 51 mL/min — ABNORMAL LOW (ref >60)
Glucose, Bld: 87 mg/dL (ref 70–99)
Potassium: 4.6 mmol/L (ref 3.5–5.1)
Sodium: 138 mmol/L (ref 135–145)
Total Bilirubin: 0.4 mg/dL (ref 0.3–1.2)
Total Protein: 6.4 g/dL — ABNORMAL LOW (ref 6.5–8.1)

## 2022-01-04 MED ORDER — DIVALPROEX SODIUM ER 500 MG PO TB24
1000.0000 mg | ORAL_TABLET | Freq: Every day | ORAL | Status: DC
  Administered 2022-01-04 – 2022-01-15 (×12): 1000 mg via ORAL
  Filled 2022-01-04 (×12): qty 2

## 2022-01-04 MED ORDER — SORBITOL 70 % SOLN
300.0000 mL | TOPICAL_OIL | Freq: Once | ORAL | Status: AC
  Administered 2022-01-04: 300 mL via RECTAL
  Filled 2022-01-04: qty 90

## 2022-01-04 MED ORDER — HALOPERIDOL 5 MG PO TABS
20.0000 mg | ORAL_TABLET | Freq: Three times a day (TID) | ORAL | Status: DC
  Administered 2022-01-04 – 2022-01-07 (×10): 20 mg via ORAL
  Filled 2022-01-04 (×10): qty 4

## 2022-01-04 MED ORDER — LITHIUM CARBONATE 300 MG PO CAPS
300.0000 mg | ORAL_CAPSULE | Freq: Every day | ORAL | Status: DC
  Administered 2022-01-04 – 2022-01-15 (×12): 300 mg via ORAL
  Filled 2022-01-04 (×12): qty 1

## 2022-01-04 MED ORDER — QUETIAPINE FUMARATE 100 MG PO TABS
400.0000 mg | ORAL_TABLET | Freq: Two times a day (BID) | ORAL | Status: DC
  Administered 2022-01-04 – 2022-01-07 (×7): 400 mg via ORAL
  Filled 2022-01-04 (×7): qty 4

## 2022-01-04 MED ORDER — VITAMIN D (ERGOCALCIFEROL) 1.25 MG (50000 UNIT) PO CAPS
50000.0000 [IU] | ORAL_CAPSULE | ORAL | Status: DC
  Administered 2022-01-04 – 2022-01-11 (×2): 50000 [IU] via ORAL
  Filled 2022-01-04 (×2): qty 1

## 2022-01-04 MED ORDER — LITHIUM CARBONATE 150 MG PO CAPS
150.0000 mg | ORAL_CAPSULE | Freq: Every day | ORAL | Status: DC
  Administered 2022-01-04 – 2022-01-16 (×13): 150 mg via ORAL
  Filled 2022-01-04 (×13): qty 1

## 2022-01-04 MED ORDER — ZOLPIDEM TARTRATE 10 MG PO TABS
10.0000 mg | ORAL_TABLET | Freq: Every day | ORAL | Status: DC
Start: 2022-01-04 — End: 2022-01-16
  Administered 2022-01-04 – 2022-01-15 (×12): 10 mg via ORAL
  Filled 2022-01-04 (×12): qty 1

## 2022-01-04 NOTE — Progress Notes (Signed)
PROGRESS NOTE  Nicholas Oconnell BJS:[id number] DOB: December 05, 1960 DOA: 01/03/2022 PCP: Neale Burly, MD   LOS: 0 days   Brief Narrative / Interim history:  61 y.o. male with medical history significant of Ogilvie syndrome, HTN, CKD3a, RCC s/p partial right nephrectomy, HTN, schizoaffective disorder. Presenting with generalized weakness, poor PO intake and constipation. History is from caregiver at bedside. He reports that the patient has 5 days of weakness and poor appetite. During this time, he has not had any fevers, urinary changes, or vomiting note.  Subjective / 24h Interval events: Patient is still constipated today.  He did have a bowel movement overnight per night RN  Assesement and Plan: Principal Problem:   Abdominal distention Active Problems:   CKD (chronic kidney disease), stage III (HCC)   Schizoaffective disorder, bipolar type (HCC)   HTN (hypertension)   Hypocalcemia   Elevated alkaline phosphatase level   Generalized weakness   History of tremor   Constipation   Ogilvie syndrome   Principal problem Acute on chronic constipation, Ogilvie syndrome, abdominal distention-on admission he was placed on a bowel regimen, he also received an enema with some improvement overnight.  Still remains somewhat tympanic on exam and distended, repeat enema.  GI consulted, appreciate input.  Discussed with Dr. Benson Norway today  Active problems Generalized weakness -likely secondary to above.  PT evaluation pending.   YWV3X -his baseline creatinine is around 1.3-1.5, with most recent 1 2 months ago was 1.7.  Currently at baseline   HTN -normotensive, monitor   Hypocalcemia, vitamin D deficiency-replete vitamin D   Elevated alk phos -isolated, unclear significance   Schizoaffective disorder Cognitive impairment- continue home regimen as below   Hx of tremor - continue home regimen. There is a 11/09/21 note from family medicine that mentions evaluation with neurology. It mentions  that neurology has recommended to psyc considering starting carbidopa/levodopa for drug-induced parkinsonism. It looks like no action has been taken on this. Will defer to his outpatient psyc and neuro for his regimen going forward  Scheduled Meds:  divalproex  1,000 mg Oral QHS   enoxaparin (LOVENOX) injection  40 mg Subcutaneous Q24H   haloperidol  20 mg Oral TID   lithium carbonate  150 mg Oral Daily   lithium carbonate  300 mg Oral QHS   polyethylene glycol  17 g Oral Daily   QUEtiapine  400 mg Oral BID   senna-docusate  1 tablet Oral BID   zolpidem  10 mg Oral QHS   Continuous Infusions:  lactated ringers 100 mL/hr at 01/04/22 0115   PRN Meds:.hydrALAZINE, ondansetron (ZOFRAN) IV  Diet Orders (From admission, onward)     Start     Ordered   01/03/22 1757  Diet clear liquid Room service appropriate? Yes; Fluid consistency: Thin  Diet effective now       Question Answer Comment  Room service appropriate? Yes   Fluid consistency: Thin      01/03/22 1756            DVT prophylaxis: enoxaparin (LOVENOX) injection 40 mg Start: 01/03/22 1800   Lab Results  Component Value Date   PLT 243 01/04/2022      Code Status: Full Code  Family Communication: no family at bedside   Status is: Inpatient  Remains inpatient appropriate because: persistent abdominal distention   Level of care: Med-Surg  Consultants:  GI  Objective: Vitals:   01/03/22 2214 01/04/22 0215 01/04/22 0535 01/04/22 1357  BP: (!) 146/86 138/78 130/78 126/82  Pulse: 80 78 63 65  Resp: '16 16 14 16  ' Temp: 97.8 F (36.6 C) 97.7 F (36.5 C) 98.2 F (36.8 C) 99 F (37.2 C)  TempSrc: Oral Oral Oral Oral  SpO2: 91% 96% 100% 100%  Weight: 70.3 kg     Height: '5\' 8"'  (1.727 m)       Intake/Output Summary (Last 24 hours) at 01/04/2022 1418 Last data filed at 01/03/2022 2300 Gross per 24 hour  Intake 835.63 ml  Output 100 ml  Net 735.63 ml   Wt Readings from Last 3 Encounters:  01/03/22 70.3 kg   11/28/21 70.3 kg  09/07/21 83.9 kg    Examination:  Constitutional: NAD Eyes: no scleral icterus ENMT: Mucous membranes are moist.  Neck: normal, supple Respiratory: clear to auscultation bilaterally, no wheezing, no crackles. Normal respiratory effort. No accessory muscle use.  Cardiovascular: Regular rate and rhythm, no murmurs / rubs / gallops. No LE edema.  Abdomen: Distended, firm but not tender.  Tympanic Musculoskeletal: no clubbing / cyanosis.  Skin: no rashes Neurologic: non focal   Data Reviewed: I have independently reviewed following labs and imaging studies   CBC Recent Labs  Lab 01/03/22 0928 01/04/22 0450  WBC 4.6 5.8  HGB 15.1 13.1  HCT 47.6 40.4  PLT 211 243  MCV 94.8 94.0  MCH 30.1 30.5  MCHC 31.7 32.4  RDW 13.3 13.2  LYMPHSABS 1.1  --   MONOABS 0.3  --   EOSABS 0.1  --   BASOSABS 0.0  --     Recent Labs  Lab 01/03/22 0842 01/03/22 0928 01/03/22 2122 01/04/22 0450  NA  --  139  --  138  K  --  4.6  --  4.6  CL  --  111  --  111  CO2  --  21*  --  21*  GLUCOSE  --  88  --  87  BUN  --  21  --  16  CREATININE  --  1.64*  --  1.53*  CALCIUM  --  7.9*  --  8.7*  AST  --  20  --  17  ALT  --  14  --  13  ALKPHOS  --  170*  --  139*  BILITOT  --  0.5  --  0.4  ALBUMIN  --  3.8  --  3.1*  MG  --   --  2.5*  --   AMMONIA 26  --   --   --     ------------------------------------------------------------------------------------------------------------------ No results for input(s): "CHOL", "HDL", "LDLCALC", "TRIG", "CHOLHDL", "LDLDIRECT" in the last 72 hours.  No results found for: "HGBA1C" ------------------------------------------------------------------------------------------------------------------ No results for input(s): "TSH", "T4TOTAL", "T3FREE", "THYROIDAB" in the last 72 hours.  Invalid input(s): "FREET3"  Cardiac Enzymes No results for input(s): "CKMB", "TROPONINI", "MYOGLOBIN" in the last 168 hours.  Invalid input(s):  "CK" ------------------------------------------------------------------------------------------------------------------ No results found for: "BNP"  CBG: No results for input(s): "GLUCAP" in the last 168 hours.  No results found for this or any previous visit (from the past 240 hour(s)).   Radiology Studies: No results found.   Marzetta Board, MD, PhD [facility]  Between 7 am - 7 pm I am available, please contact me via Amion (for emergencies) or Securechat (non urgent messages)  Between 7 pm - 7 am I am not available, please contact night coverage MD/APP via Amion

## 2022-01-04 NOTE — Plan of Care (Signed)

## 2022-01-04 NOTE — Progress Notes (Signed)
Subjective: No acute changes.  Objective: Vital signs in last 24 hours: Temp:  [97.7 F (36.5 C)-98.7 F (37.1 C)] 98.2 F (36.8 C) (08/03 0535) Pulse Rate:  [63-80] 63 (08/03 0535) Resp:  [14-18] 14 (08/03 0535) BP: (121-147)/(78-101) 130/78 (08/03 0535) SpO2:  [91 %-100 %] 100 % (08/03 0535) Weight:  [70.3 kg] 70.3 kg (08/02 2214) Last BM Date : 01/03/22  Intake/Output from previous day: 08/02 0701 - 08/03 0700 In: 835.6 [I.V.:785.6; IV Piggyback:50] Out: 100 [Urine:100] Intake/Output this shift: No intake/output data recorded.  General appearance: alert and no distress GI: distended, tight, tympanic  Lab Results: Recent Labs    01/03/22 0928 01/04/22 0450  WBC 4.6 5.8  HGB 15.1 13.1  HCT 47.6 40.4  PLT 211 243   BMET Recent Labs    01/03/22 0928 01/04/22 0450  NA 139 138  K 4.6 4.6  CL 111 111  CO2 21* 21*  GLUCOSE 88 87  BUN 21 16  CREATININE 1.64* 1.53*  CALCIUM 7.9* 8.7*   LFT Recent Labs    01/04/22 0450  PROT 6.4*  ALBUMIN 3.1*  AST 17  ALT 13  ALKPHOS 139*  BILITOT 0.4   PT/INR No results for input(s): "LABPROT", "INR" in the last 72 hours. Hepatitis Panel No results for input(s): "HEPBSAG", "HCVAB", "HEPAIGM", "HEPBIGM" in the last 72 hours. C-Diff No results for input(s): "CDIFFTOX" in the last 72 hours. Fecal Lactopherrin No results for input(s): "FECLLACTOFRN" in the last 72 hours.  Studies/Results: CT ABDOMEN PELVIS W CONTRAST  Result Date: 01/03/2022 CLINICAL DATA:  Generalized weakness, abdominal pain, decreased appetite, constipation EXAM: CT ABDOMEN AND PELVIS WITH CONTRAST TECHNIQUE: Multidetector CT imaging of the abdomen and pelvis was performed using the standard protocol following bolus administration of intravenous contrast. RADIATION DOSE REDUCTION: This exam was performed according to the departmental dose-optimization program which includes automated exposure control, adjustment of the mA and/or kV according to patient  size and/or use of iterative reconstruction technique. CONTRAST:  16m OMNIPAQUE IOHEXOL 300 MG/ML  SOLN COMPARISON:  06/28/2021 FINDINGS: Lower chest: No acute abnormality. Hepatobiliary: No solid liver abnormality is seen. No gallstones, gallbladder wall thickening, or biliary dilatation. Pancreas: Unremarkable. No pancreatic ductal dilatation or surrounding inflammatory changes. Spleen: Normal in size without significant abnormality. Adrenals/Urinary Tract: Adrenal glands are unremarkable. Kidneys are normal, without renal calculi, solid lesion, or hydronephrosis. Bladder is unremarkable. Stomach/Bowel: Stomach is within normal limits. Evidence of partial left colon resection and reanastomosis. Extremely large burden of stool in the distal colon, stool balls in the distal colon and rectum measuring up to 7.2 cm. Vascular/Lymphatic: No significant vascular findings are present. No enlarged abdominal or pelvic lymph nodes. Reproductive: No mass or other significant abnormality. Other: No abdominal wall hernia or abnormality. No ascites. Musculoskeletal: No acute or significant osseous findings. IMPRESSION: 1. Extremely large burden of stool and large stool balls in the distal colon and rectum. 2. Evidence of partial left colon resection and reanastomosis. Electronically Signed   By: ADelanna AhmadiM.D.   On: 01/03/2022 12:31   CT Head Wo Contrast  Result Date: 01/03/2022 CLINICAL DATA:  Generalized weakness, fatigue, decreased appetite. EXAM: CT HEAD WITHOUT CONTRAST TECHNIQUE: Contiguous axial images were obtained from the base of the skull through the vertex without intravenous contrast. RADIATION DOSE REDUCTION: This exam was performed according to the departmental dose-optimization program which includes automated exposure control, adjustment of the mA and/or kV according to patient size and/or use of iterative reconstruction technique. COMPARISON:  CT head  12/12/2018 FINDINGS: Brain: There is no acute  intracranial hemorrhage, extra-axial fluid collection, or acute infarct. Parenchymal volume is normal. The ventricles are stable in size. Gray-white differentiation is preserved There is no mass lesion.  There is no mass effect or midline shift. Vascular: No hyperdense vessel or unexpected calcification. Skull: Normal. Negative for fracture or focal lesion. Sinuses/Orbits: The paranasal sinuses are clear. The globes and orbits are unremarkable. Other: None. IMPRESSION: No acute intracranial pathology. Electronically Signed   By: Valetta Mole M.D.   On: 01/03/2022 10:37   DG ABD ACUTE 2+V W 1V CHEST  Result Date: 01/03/2022 CLINICAL DATA:  Abdominal pain. Generalized weakness, fatigue, and decreased appetite. EXAM: DG ABDOMEN ACUTE WITH 1 VIEW CHEST COMPARISON:  AP chest 11/08/2021; KUB 07/05/2021; CT abdomen and pelvis FINDINGS: Mildly decreased lung volumes. Cardiac silhouette and mediastinal contours are within normal limits. The lungs are clear. No pleural effusion or pneumothorax. Mild multilevel degenerative disc changes of the thoracic spine. Surgical clips overlie the left upper abdominal quadrant. The sigmoid colon is highly distended with air, however this appears similar to 07/05/2021 frontal radiograph. There is also new high-grade stool within the rectum and sigmoid colon concerning for fecal impaction. High-grade stool is also seen throughout the ascending and transverse colon. Note is made the patient appears to be status post partial colectomy on prior CT. No definite dilated loops of small bowel to indicate bowel obstruction. Vascular phleboliths again overlie the right hemipelvis. No subdiaphragmatic free air is seen. IMPRESSION: Note is made of postsurgical change of partial colectomy on prior CT. There is air-filled distention of the postsurgical colon which is very similar to multiple prior radiographs and 06/28/2021 CT. There is also high-grade stool seen within the rectum and postsurgical  remaining colon. Findings are suspicious for constipation and possible rectal impaction. Electronically Signed   By: Yvonne Kendall M.D.   On: 01/03/2022 10:08    Medications: Scheduled:  divalproex  1,000 mg Oral QHS   enoxaparin (LOVENOX) injection  40 mg Subcutaneous Q24H   haloperidol  20 mg Oral TID   lithium carbonate  150 mg Oral Daily   lithium carbonate  300 mg Oral QHS   polyethylene glycol  17 g Oral Daily   QUEtiapine  400 mg Oral BID   senna-docusate  1 tablet Oral BID   zolpidem  10 mg Oral QHS   Continuous:  lactated ringers 100 mL/hr at 01/04/22 0115    Assessment/Plan: 1) Fecal impaction. 2) History of Ogilvie syndrome.   It was reported that he had a good bowel movement, but he is still distended.  There is no acute abdomen.  Plan: 1) Try SMOG enema.  LOS: 0 days   Jahmil Macleod D 01/04/2022, 1:08 PM

## 2022-01-04 NOTE — Evaluation (Signed)
Physical Therapy Evaluation Patient Details Name: Nicholas Oconnell MRN: [id number] DOB: 07-May-1961 Today's Date: 01/04/2022  History of Present Illness  61 y.o. male presenting from a group home with generalized weakness, poor PO intake and constipation and admitted 01/03/22 for Acute on chronic constipation, Ogilvie syndrome, abdominal distention. Past medical history significant of Ogilvie syndrome, HTN, CKD3a, RCC s/p partial right nephrectomy, HTN, schizoaffective disorder.  Clinical Impression  Pt admitted with above diagnosis.  Pt currently with functional limitations due to the deficits listed below (see PT Problem List). Pt will benefit from skilled PT to increase their independence and safety with mobility to allow discharge to the venue listed below.  Pt poor historian.  Per chart from group home.  Pt unable to stand without significant assist today and utilized stedy to transfer OOB to recliner.  If group home is not able to provide current level of assist then pt may need SNF.        Recommendations for follow up therapy are one component of a multi-disciplinary discharge planning process, led by the attending physician.  Recommendations may be updated based on patient status, additional functional criteria and insurance authorization.  Follow Up Recommendations Skilled nursing-short term rehab (<3 hours/day) Can patient physically be transported by private vehicle: No    Assistance Recommended at Discharge Frequent or constant Supervision/Assistance  Patient can return home with the following  Two people to help with walking and/or transfers;Two people to help with bathing/dressing/bathroom    Equipment Recommendations None recommended by PT  Recommendations for Other Services       Functional Status Assessment Patient has had a recent decline in their functional status and demonstrates the ability to make significant improvements in function in a reasonable and predictable amount of  time.     Precautions / Restrictions Precautions Precautions: Fall      Mobility  Bed Mobility Overal bed mobility: Needs Assistance Bed Mobility: Supine to Sit     Supine to sit: Max assist, HOB elevated     General bed mobility comments: assist for LEs over EOB and trunk upright, pt assisted as able with upper body using bed rail    Transfers Overall transfer level: Needs assistance Equipment used: Rolling walker (2 wheels) Transfers: Sit to/from Stand, Bed to chair/wheelchair/BSC Sit to Stand: Max assist           General transfer comment: attempted with RW and pt unable to stand; provided stedy for safety and pt better able to assist; perform sit to stand x3 and then transferred over to recliner in [place] (RN and NT in room and assisted as pt had BM in bed) Transfer via Lift Equipment: Stedy  Ambulation/Gait                  Stairs            Wheelchair Mobility    Modified Rankin (Stroke Patients Only)       Balance                                             Pertinent Vitals/Pain Pain Assessment Pain Assessment: No/denies pain    Home Living Family/patient expects to be discharged to:: Group home                   Additional Comments: Pt unable to provide reliable information regarding group home.  Prior Function Prior Level of Function : Patient poor historian/Family not available             Mobility Comments: states no to ambulating and yes to using a w/c; was admitted in January 2023 and able to ambulate at that time       Hand Dominance        Extremity/Trunk Assessment        Lower Extremity Assessment Lower Extremity Assessment: Generalized weakness    Cervical / Trunk Assessment Cervical / Trunk Assessment: Normal  Communication   Communication: No difficulties  Cognition Arousal/Alertness: Awake/alert Behavior During Therapy: WFL for tasks assessed/performed Overall Cognitive  Status: History of cognitive impairments - at baseline                                          General Comments      Exercises     Assessment/Plan    PT Assessment Patient needs continued PT services  PT Problem List Decreased strength;Decreased mobility;Decreased balance;Decreased activity tolerance;Decreased knowledge of precautions       PT Treatment Interventions DME instruction;Gait training;Functional mobility training;Therapeutic activities;Patient/family education;Therapeutic exercise;Balance training;Wheelchair mobility training    PT Goals (Current goals can be found in the Care Plan section)  Acute Rehab PT Goals PT Goal Formulation: Patient unable to participate in goal setting Time For Goal Achievement: 01/18/22 Potential to Achieve Goals: Fair    Frequency Min 2X/week     Co-evaluation               AM-PAC PT "6 Clicks" Mobility  Outcome Measure Help needed turning from your back to your side while in a flat bed without using bedrails?: A Lot Help needed moving from lying on your back to sitting on the side of a flat bed without using bedrails?: A Lot Help needed moving to and from a bed to a chair (including a wheelchair)?: A Lot Help needed standing up from a chair using your arms (e.g., wheelchair or bedside chair)?: Total Help needed to walk in hospital room?: Total Help needed climbing 3-5 steps with a railing? : Total 6 Click Score: 9    End of Session   Activity Tolerance: Patient limited by fatigue Patient left: in chair;with call bell/phone within reach;with chair alarm set;with nursing/sitter in room Nurse Communication: Need for lift equipment PT Visit Diagnosis: Muscle weakness (generalized) (M62.81);Other abnormalities of gait and mobility (R26.89)    Time: 8115-7262 PT Time Calculation (min) (ACUTE ONLY): 22 min   Charges:   PT Evaluation $PT Eval Low Complexity: 1 Low        Kati PT, DPT Physical  Therapist Acute Rehabilitation Services Preferred contact method: Secure Chat Weekend Pager Only: [phone] Office: [place] 01/04/2022, 3:23 PM

## 2022-01-05 DIAGNOSIS — R140 Abdominal distension (gaseous): Secondary | ICD-10-CM | POA: Diagnosis not present

## 2022-01-05 MED ORDER — BOOST / RESOURCE BREEZE PO LIQD CUSTOM
1.0000 | Freq: Three times a day (TID) | ORAL | Status: DC
  Administered 2022-01-05 – 2022-01-16 (×24): 1 via ORAL

## 2022-01-05 MED ORDER — SORBITOL 70 % SOLN
300.0000 mL | TOPICAL_OIL | Freq: Once | ORAL | Status: AC
  Administered 2022-01-05: 300 mL via RECTAL
  Filled 2022-01-05: qty 90

## 2022-01-05 NOTE — Plan of Care (Signed)

## 2022-01-05 NOTE — Progress Notes (Signed)
PROGRESS NOTE  Nicholas Oconnell XOV:[id number] DOB: 19-Mar-1961 DOA: 01/03/2022 PCP: Neale Burly, MD   LOS: 1 day   Brief Narrative / Interim history:  61 y.o. male with medical history significant of Ogilvie syndrome, HTN, CKD3a, RCC s/p partial right nephrectomy, HTN, schizoaffective disorder. Presenting with generalized weakness, poor PO intake and constipation. History is from caregiver at bedside. He reports that the patient has 5 days of weakness and poor appetite. During this time, he has not had any fevers, urinary changes, or vomiting note.  Subjective / 24h Interval events: He had couple of BMs overnight. He tells me he feels good this morning.   Assesement and Plan: Principal Problem:   Abdominal distention Active Problems:   CKD (chronic kidney disease), stage III (HCC)   Schizoaffective disorder, bipolar type (HCC)   HTN (hypertension)   Hypocalcemia   Elevated alkaline phosphatase level   Generalized weakness   History of tremor   Constipation   Ogilvie syndrome   Principal problem Acute on chronic constipation, Ogilvie syndrome, abdominal distention-on admission he was placed on a bowel regimen, he also received an enema with some improvement overnight.  His abdomen is softer today.  Continue aggressive bowel regimen, advance diet as he clinically appears improved.  Active problems Generalized weakness -likely secondary to above.  Working with PT   YOK5T -his baseline creatinine is around 1.3-1.5, with most recent 1 2 months ago was 1.7.  Creatinine currently is at baseline   HTN -normotensive, monitor   Hypocalcemia, vitamin D deficiency-replete vitamin D   Elevated alk phos -isolated, unclear significance   Schizoaffective disorder Cognitive impairment- continue home regimen as below   Hx of tremor - continue home regimen. There is a 11/09/21 note from family medicine that mentions evaluation with neurology. It mentions that neurology has recommended to  psyc considering starting carbidopa/levodopa for drug-induced parkinsonism. It looks like no action has been taken on this. Will defer to his outpatient psyc and neuro for his regimen going forward  Scheduled Meds:  divalproex  1,000 mg Oral QHS   enoxaparin (LOVENOX) injection  40 mg Subcutaneous Q24H   feeding supplement  1 Container Oral TID BM   haloperidol  20 mg Oral TID   lithium carbonate  150 mg Oral Daily   lithium carbonate  300 mg Oral QHS   polyethylene glycol  17 g Oral Daily   QUEtiapine  400 mg Oral BID   senna-docusate  1 tablet Oral BID   Vitamin D (Ergocalciferol)  50,000 Units Oral Q7 days   zolpidem  10 mg Oral QHS   Continuous Infusions:   PRN Meds:.hydrALAZINE, ondansetron (ZOFRAN) IV  Diet Orders (From admission, onward)     Start     Ordered   01/05/22 1321  Diet full liquid Room service appropriate? Yes; Fluid consistency: Thin  Diet effective now       Question Answer Comment  Room service appropriate? Yes   Fluid consistency: Thin      01/05/22 1320            DVT prophylaxis: enoxaparin (LOVENOX) injection 40 mg Start: 01/03/22 1800   Lab Results  Component Value Date   PLT 243 01/04/2022      Code Status: Full Code  Family Communication: no family at bedside   Status is: Inpatient  Remains inpatient appropriate because: persistent abdominal distention   Level of care: Med-Surg  Consultants:  GI  Objective: Vitals:   01/04/22 1357 01/04/22 2050 01/05/22 9774  01/05/22 1323  BP: 126/82 137/84 128/74 125/72  Pulse: 65 61 64 64  Resp: '16 16 16 18  ' Temp: 99 F (37.2 C) 98.7 F (37.1 C) 98.4 F (36.9 C) 98.2 F (36.8 C)  TempSrc: Oral Oral Oral Oral  SpO2: 100% 99% 100% 100%  Weight:      Height:        Intake/Output Summary (Last 24 hours) at 01/05/2022 1444 Last data filed at 01/05/2022 1341 Gross per 24 hour  Intake 4200.52 ml  Output 800 ml  Net 3400.52 ml    Wt Readings from Last 3 Encounters:  01/03/22 70.3  kg  11/28/21 70.3 kg  09/07/21 83.9 kg    Examination:  Constitutional: NAD Eyes: lids and conjunctivae normal, no scleral icterus ENMT: mmm Neck: normal, supple Respiratory: clear to auscultation bilaterally, no wheezing, no crackles.  Cardiovascular: Regular rate and rhythm, no murmurs / rubs / gallops. No LE edema. Abdomen: soft, mildly distended, no tenderness Skin: no rashes Neurologic: no focal deficits, equal strength   Data Reviewed: I have independently reviewed following labs and imaging studies   CBC Recent Labs  Lab 01/03/22 0928 01/04/22 0450  WBC 4.6 5.8  HGB 15.1 13.1  HCT 47.6 40.4  PLT 211 243  MCV 94.8 94.0  MCH 30.1 30.5  MCHC 31.7 32.4  RDW 13.3 13.2  LYMPHSABS 1.1  --   MONOABS 0.3  --   EOSABS 0.1  --   BASOSABS 0.0  --      Recent Labs  Lab 01/03/22 0842 01/03/22 0928 01/03/22 2122 01/04/22 0450  NA  --  139  --  138  K  --  4.6  --  4.6  CL  --  111  --  111  CO2  --  21*  --  21*  GLUCOSE  --  88  --  87  BUN  --  21  --  16  CREATININE  --  1.64*  --  1.53*  CALCIUM  --  7.9*  --  8.7*  AST  --  20  --  17  ALT  --  14  --  13  ALKPHOS  --  170*  --  139*  BILITOT  --  0.5  --  0.4  ALBUMIN  --  3.8  --  3.1*  MG  --   --  2.5*  --   AMMONIA 26  --   --   --      ------------------------------------------------------------------------------------------------------------------ No results for input(s): "CHOL", "HDL", "LDLCALC", "TRIG", "CHOLHDL", "LDLDIRECT" in the last 72 hours.  No results found for: "HGBA1C" ------------------------------------------------------------------------------------------------------------------ No results for input(s): "TSH", "T4TOTAL", "T3FREE", "THYROIDAB" in the last 72 hours.  Invalid input(s): "FREET3"  Cardiac Enzymes No results for input(s): "CKMB", "TROPONINI", "MYOGLOBIN" in the last 168 hours.  Invalid input(s):  "CK" ------------------------------------------------------------------------------------------------------------------ No results found for: "BNP"  CBG: No results for input(s): "GLUCAP" in the last 168 hours.  No results found for this or any previous visit (from the past 240 hour(s)).   Radiology Studies: No results found.   Marzetta Board, MD, PhD [facility]  Between 7 am - 7 pm I am available, please contact me via Amion (for emergencies) or Securechat (non urgent messages)  Between 7 pm - 7 am I am not available, please contact night coverage MD/APP via Amion

## 2022-01-05 NOTE — Progress Notes (Signed)
Subjective: There was a report that the patient had a bowel movement.  Objective: Vital signs in last 24 hours: Temp:  [98.4 F (36.9 C)-99 F (37.2 C)] 98.4 F (36.9 C) (08/04 0615) Pulse Rate:  [61-65] 64 (08/04 0615) Resp:  [16] 16 (08/04 0615) BP: (126-137)/(74-84) 128/74 (08/04 0615) SpO2:  [99 %-100 %] 100 % (08/04 0615) Last BM Date : 01/05/22  Intake/Output from previous day: 08/03 0701 - 08/04 0700 In: 2995.5 [P.O.:120; I.V.:2875.5] Out: 800 [Urine:800] Intake/Output this shift: No intake/output data recorded.  General appearance: alert and no distress GI: distended, tympanic, firm  Lab Results: Recent Labs    01/03/22 0928 01/04/22 0450  WBC 4.6 5.8  HGB 15.1 13.1  HCT 47.6 40.4  PLT 211 243   BMET Recent Labs    01/03/22 0928 01/04/22 0450  NA 139 138  K 4.6 4.6  CL 111 111  CO2 21* 21*  GLUCOSE 88 87  BUN 21 16  CREATININE 1.64* 1.53*  CALCIUM 7.9* 8.7*   LFT Recent Labs    01/04/22 0450  PROT 6.4*  ALBUMIN 3.1*  AST 17  ALT 13  ALKPHOS 139*  BILITOT 0.4   PT/INR No results for input(s): "LABPROT", "INR" in the last 72 hours. Hepatitis Panel No results for input(s): "HEPBSAG", "HCVAB", "HEPAIGM", "HEPBIGM" in the last 72 hours. C-Diff No results for input(s): "CDIFFTOX" in the last 72 hours. Fecal Lactopherrin No results for input(s): "FECLLACTOFRN" in the last 72 hours.  Studies/Results: CT ABDOMEN PELVIS W CONTRAST  Result Date: 01/03/2022 CLINICAL DATA:  Generalized weakness, abdominal pain, decreased appetite, constipation EXAM: CT ABDOMEN AND PELVIS WITH CONTRAST TECHNIQUE: Multidetector CT imaging of the abdomen and pelvis was performed using the standard protocol following bolus administration of intravenous contrast. RADIATION DOSE REDUCTION: This exam was performed according to the departmental dose-optimization program which includes automated exposure control, adjustment of the mA and/or kV according to patient size and/or  use of iterative reconstruction technique. CONTRAST:  48m OMNIPAQUE IOHEXOL 300 MG/ML  SOLN COMPARISON:  06/28/2021 FINDINGS: Lower chest: No acute abnormality. Hepatobiliary: No solid liver abnormality is seen. No gallstones, gallbladder wall thickening, or biliary dilatation. Pancreas: Unremarkable. No pancreatic ductal dilatation or surrounding inflammatory changes. Spleen: Normal in size without significant abnormality. Adrenals/Urinary Tract: Adrenal glands are unremarkable. Kidneys are normal, without renal calculi, solid lesion, or hydronephrosis. Bladder is unremarkable. Stomach/Bowel: Stomach is within normal limits. Evidence of partial left colon resection and reanastomosis. Extremely large burden of stool in the distal colon, stool balls in the distal colon and rectum measuring up to 7.2 cm. Vascular/Lymphatic: No significant vascular findings are present. No enlarged abdominal or pelvic lymph nodes. Reproductive: No mass or other significant abnormality. Other: No abdominal wall hernia or abnormality. No ascites. Musculoskeletal: No acute or significant osseous findings. IMPRESSION: 1. Extremely large burden of stool and large stool balls in the distal colon and rectum. 2. Evidence of partial left colon resection and reanastomosis. Electronically Signed   By: ADelanna AhmadiM.D.   On: 01/03/2022 12:31   CT Head Wo Contrast  Result Date: 01/03/2022 CLINICAL DATA:  Generalized weakness, fatigue, decreased appetite. EXAM: CT HEAD WITHOUT CONTRAST TECHNIQUE: Contiguous axial images were obtained from the base of the skull through the vertex without intravenous contrast. RADIATION DOSE REDUCTION: This exam was performed according to the departmental dose-optimization program which includes automated exposure control, adjustment of the mA and/or kV according to patient size and/or use of iterative reconstruction technique. COMPARISON:  CT head 12/12/2018  FINDINGS: Brain: There is no acute intracranial  hemorrhage, extra-axial fluid collection, or acute infarct. Parenchymal volume is normal. The ventricles are stable in size. Gray-white differentiation is preserved There is no mass lesion.  There is no mass effect or midline shift. Vascular: No hyperdense vessel or unexpected calcification. Skull: Normal. Negative for fracture or focal lesion. Sinuses/Orbits: The paranasal sinuses are clear. The globes and orbits are unremarkable. Other: None. IMPRESSION: No acute intracranial pathology. Electronically Signed   By: Valetta Mole M.D.   On: 01/03/2022 10:37   DG ABD ACUTE 2+V W 1V CHEST  Result Date: 01/03/2022 CLINICAL DATA:  Abdominal pain. Generalized weakness, fatigue, and decreased appetite. EXAM: DG ABDOMEN ACUTE WITH 1 VIEW CHEST COMPARISON:  AP chest 11/08/2021; KUB 07/05/2021; CT abdomen and pelvis FINDINGS: Mildly decreased lung volumes. Cardiac silhouette and mediastinal contours are within normal limits. The lungs are clear. No pleural effusion or pneumothorax. Mild multilevel degenerative disc changes of the thoracic spine. Surgical clips overlie the left upper abdominal quadrant. The sigmoid colon is highly distended with air, however this appears similar to 07/05/2021 frontal radiograph. There is also new high-grade stool within the rectum and sigmoid colon concerning for fecal impaction. High-grade stool is also seen throughout the ascending and transverse colon. Note is made the patient appears to be status post partial colectomy on prior CT. No definite dilated loops of small bowel to indicate bowel obstruction. Vascular phleboliths again overlie the right hemipelvis. No subdiaphragmatic free air is seen. IMPRESSION: Note is made of postsurgical change of partial colectomy on prior CT. There is air-filled distention of the postsurgical colon which is very similar to multiple prior radiographs and 06/28/2021 CT. There is also high-grade stool seen within the rectum and postsurgical remaining  colon. Findings are suspicious for constipation and possible rectal impaction. Electronically Signed   By: Yvonne Kendall M.D.   On: 01/03/2022 10:08    Medications: Scheduled:  divalproex  1,000 mg Oral QHS   enoxaparin (LOVENOX) injection  40 mg Subcutaneous Q24H   haloperidol  20 mg Oral TID   lithium carbonate  150 mg Oral Daily   lithium carbonate  300 mg Oral QHS   polyethylene glycol  17 g Oral Daily   QUEtiapine  400 mg Oral BID   senna-docusate  1 tablet Oral BID   Vitamin D (Ergocalciferol)  50,000 Units Oral Q7 days   zolpidem  10 mg Oral QHS   Continuous:  lactated ringers 100 mL/hr at 01/05/22 0554    Assessment/Plan: 1) Fecal impaction. 2) History of Ogilvie's syndrome.   The patient's abdomen is still firm.  He will be tried on another SMOG enema.  Plan: 1) SMOG enema.  LOS: 1 day   Latisha Lasch D 01/05/2022, 8:22 AM

## 2022-01-05 NOTE — Progress Notes (Signed)
Initial Nutrition Assessment  INTERVENTION:   -Boost Breeze po TID, each supplement provides 250 kcal and 9 grams of protein   -Placed order for clear liquid lunch tray  NUTRITION DIAGNOSIS:   Inadequate oral intake related to constipation as evidenced by  (clear liquid diet).  GOAL:   Patient will meet greater than or equal to 90% of their needs  MONITOR:   PO intake, Supplement acceptance, Labs, Weight trends, I & O's  REASON FOR ASSESSMENT:   Malnutrition Screening Tool    ASSESSMENT:   61 y.o. male with medical history significant of Ogilvie syndrome, HTN, CKD3a, RCC s/p partial right nephrectomy, HTN, schizoaffective disorder. Presenting with generalized weakness, poor PO intake and constipation.  Patient in room, no caretakers at bedside. Pt alert/oriented but at times mumbles responses and is hard to understand. Pt states he feels hungry. Would like his lunch tray of clears. States he has mostly had water today. Placed order for clears with coffee. He is agreeable to receiving Boost Breeze supplements as well.  Per chart review, pt had poor PO for 5 days PTA. Pt has been constipated. Has been having BMs today.   Medications: Miralax, Senokot, Vitamin D weekly, Lactated ringers  Labs reviewed:  Elevated Mg Low Vitamin D  NUTRITION - FOCUSED PHYSICAL EXAM:  Flowsheet Row Most Recent Value  Orbital Region No depletion  Upper Arm Region No depletion  Thoracic and Lumbar Region No depletion  Buccal Region Mild depletion  Temple Region Mild depletion  Clavicle Bone Region No depletion  Clavicle and Acromion Bone Region No depletion  Scapular Bone Region No depletion  Dorsal Hand No depletion  Patellar Region No depletion  Anterior Thigh Region No depletion  Posterior Calf Region No depletion  Edema (RD Assessment) None  Hair Reviewed  Eyes Reviewed  Mouth Reviewed  [missing teeth]  Skin Reviewed       Diet Order:   Diet Order             Diet clear  liquid Room service appropriate? Yes; Fluid consistency: Thin  Diet effective now                   EDUCATION NEEDS:   No education needs have been identified at this time  Skin:  Skin Assessment: Reviewed RN Assessment  Last BM:  8/4 -type 6  Height:   Ht Readings from Last 1 Encounters:  01/03/22 '5\' 8"'$  (1.727 m)    Weight:   Wt Readings from Last 1 Encounters:  01/03/22 70.3 kg   BMI:  Body mass index is 23.57 kg/m.  Estimated Nutritional Needs:   Kcal:  1610-9604  Protein:  80-90g  Fluid:  1.9L/day   Clayton Bibles, MS, RD, LDN Inpatient Clinical Dietitian Contact information available via Amion

## 2022-01-05 NOTE — Progress Notes (Signed)
Subjective: Feeling better.  He reports passing solid stool.  Objective: Vital signs in last 24 hours: Temp:  [98.2 F (36.8 C)-98.7 F (37.1 C)] 98.2 F (36.8 C) (08/04 1323) Pulse Rate:  [61-64] 64 (08/04 1323) Resp:  [16-18] 18 (08/04 1323) BP: (125-137)/(72-84) 125/72 (08/04 1323) SpO2:  [99 %-100 %] 100 % (08/04 1323) Last BM Date : 01/05/22  Intake/Output from previous day: 08/03 0701 - 08/04 0700 In: 2995.5 [P.O.:120; I.V.:2875.5] Out: 800 [Urine:800] Intake/Output this shift: Total I/O In: 1205 [P.O.:240; I.V.:965] Out: -   General appearance: alert and no distress GI: distended, tympanic, but less tense today  Lab Results: Recent Labs    01/03/22 0928 01/04/22 0450  WBC 4.6 5.8  HGB 15.1 13.1  HCT 47.6 40.4  PLT 211 243   BMET Recent Labs    01/03/22 0928 01/04/22 0450  NA 139 138  K 4.6 4.6  CL 111 111  CO2 21* 21*  GLUCOSE 88 87  BUN 21 16  CREATININE 1.64* 1.53*  CALCIUM 7.9* 8.7*   LFT Recent Labs    01/04/22 0450  PROT 6.4*  ALBUMIN 3.1*  AST 17  ALT 13  ALKPHOS 139*  BILITOT 0.4   PT/INR No results for input(s): "LABPROT", "INR" in the last 72 hours. Hepatitis Panel No results for input(s): "HEPBSAG", "HCVAB", "HEPAIGM", "HEPBIGM" in the last 72 hours. C-Diff No results for input(s): "CDIFFTOX" in the last 72 hours. Fecal Lactopherrin No results for input(s): "FECLLACTOFRN" in the last 72 hours.  Studies/Results: No results found.  Medications: Scheduled:  divalproex  1,000 mg Oral QHS   enoxaparin (LOVENOX) injection  40 mg Subcutaneous Q24H   feeding supplement  1 Container Oral TID BM   haloperidol  20 mg Oral TID   lithium carbonate  150 mg Oral Daily   lithium carbonate  300 mg Oral QHS   polyethylene glycol  17 g Oral Daily   QUEtiapine  400 mg Oral BID   senna-docusate  1 tablet Oral BID   Vitamin D (Ergocalciferol)  50,000 Units Oral Q7 days   zolpidem  10 mg Oral QHS   Continuous:  Assessment/Plan: 1)  Fecal impaction. 2) History of Ogilvie's syndrome.   The patient appears improved.  He will be placed tried again on a SMOG enema as his abdomen is still distended, however, he may have a chronic distension.    Plan: 1) SMOG enema. 2) Okay with advancing diet.  LOS: 1 day   Nicholas Oconnell D 01/05/2022, 2:57 PM

## 2022-01-05 NOTE — TOC Initial Note (Signed)
Transition of Care [facility]) - Initial/Assessment Note    Patient Details  Name: Nicholas Oconnell MRN: [id number] Date of Birth: Dec 31, 1960  [facility] CM/SW Contact:    Vassie Moselle, LCSW Phone Number: 01/05/2022, 2:55 PM  Clinical Narrative:                 Pt is able to return to [facility] group home at discharge. CSW spoke with administrator and RN who state that they have PT/OT that regularly come out to their group home. She was unsure of [facility] agency used however, states as long as she has HHPT/OT orders placed she will be able to arrange the [facility] services. RN also requested bowel regimen/medications be ordered for this pt to continue at discharge for ongoing needs. Printed copies of orders will need to be placed with pt's discharge paperwork. Group home staff will be able to provide transportation and will just need to know time of discharge as early as possible.    Expected Discharge Plan: Group Home Barriers to Discharge: No Barriers Identified   Patient Goals and CMS Choice Patient states their goals for this hospitalization and ongoing recovery are:: To return home   Choice offered to / list presented to : Patient, HC POA / Guardian  Expected Discharge Plan and Services Expected Discharge Plan: Group Home In-house Referral: Clinical Social Work Discharge Planning Services: CM Consult Post Acute Care Choice: [place] arrangements for the past 2 months: Group Home                 DME Arranged: N/A DME Agency: NA                  Prior Living Arrangements/Services Living arrangements for the past 2 months: Group Home Lives with:: Facility Resident Patient language and need for interpreter reviewed:: Yes Do you feel safe going back to the place where you live?: Yes      Need for Family Participation in Patient Care: No (Comment) Care giver support system in place?: No (comment) Current home services: DME Criminal Activity/Legal Involvement  Pertinent to Current Situation/Hospitalization: No - Comment as needed  Activities of Daily Living Home Assistive Devices/Equipment: Other (Comment) (pt unable to verbalize what equipment he uses at group home) ADL Screening (condition at time of admission) Patient's cognitive ability adequate to safely complete daily activities?: No Is the patient deaf or have difficulty hearing?: No Does the patient have difficulty seeing, even when wearing glasses/contacts?: No Does the patient have difficulty concentrating, remembering, or making decisions?: Yes Patient able to express need for assistance with ADLs?: Yes Does the patient have difficulty dressing or bathing?: Yes Independently performs ADLs?: No Communication: Independent Dressing (OT): Needs assistance Is this a change from baseline?: Change from baseline, expected to last <3days Grooming: Independent Feeding: Independent Bathing: Needs assistance Is this a change from baseline?: Change from baseline, expected to last <3 days Toileting: Needs assistance Is this a change from baseline?: Change from baseline, expected to last <3 days In/Out Bed: Needs assistance Is this a change from baseline?: Change from baseline, expected to last <3 days Walks in Home: Needs assistance Is this a change from baseline?: Change from baseline, expected to last <3 days Does the patient have difficulty walking or climbing stairs?: Yes Weakness of Legs: Both Weakness of Arms/Hands: Both  Permission Sought/Granted Permission sought to share information with : Customer service manager, Guardian  Emotional Assessment         Alcohol / Substance Use: Not Applicable Psych Involvement: No (comment)  Admission diagnosis:  Abdominal distention [R14.0] Pseudo-obstruction of colon [K59.81] Generalized weakness [R53.1] Ogilvie syndrome [K59.81] Patient Active Problem List   Diagnosis Date Noted   Ogilvie syndrome 01/04/2022    Abdominal distention 01/03/2022   HTN (hypertension) 01/03/2022   Hypocalcemia 01/03/2022   Elevated alkaline phosphatase level 01/03/2022   Generalized weakness 01/03/2022   History of tremor 01/03/2022   Constipation 01/03/2022   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

## 2022-01-06 DIAGNOSIS — R140 Abdominal distension (gaseous): Secondary | ICD-10-CM | POA: Diagnosis not present

## 2022-01-06 LAB — PTH, INTACT AND CALCIUM
Calcium, Total (PTH): 9.5 mg/dL (ref 8.6–10.2)
PTH: 21 pg/mL (ref 15–65)

## 2022-01-06 MED ORDER — LACTULOSE 10 GM/15ML PO SOLN: 20.0000 g | Freq: Two times a day (BID) | ORAL | 0 refills | Status: DC

## 2022-01-06 MED ORDER — VITAMIN D (ERGOCALCIFEROL) 1.25 MG (50000 UNIT) PO CAPS: 50000.0000 [IU] | ORAL_CAPSULE | ORAL | 0 refills | Status: AC

## 2022-01-06 MED ORDER — SENNOSIDES-DOCUSATE SODIUM 8.6-50 MG PO TABS: 2.0000 | ORAL_TABLET | Freq: Two times a day (BID) | ORAL | 0 refills | Status: AC

## 2022-01-06 MED ORDER — POLYETHYLENE GLYCOL 3350 17 G PO PACK: 17.0000 g | PACK | Freq: Two times a day (BID) | ORAL | 0 refills | Status: AC

## 2022-01-06 NOTE — Discharge Summary (Addendum)
Physician Discharge Summary  Nicholas Oconnell FEO:[id number] DOB: 1961/03/04 DOA: 01/03/2022  PCP: Neale Burly, MD  Admit date: 01/03/2022 Discharge date: 01/07/2022  Admitted From: group home  Disposition:  group home  Recommendations for Outpatient Follow-up:  Follow up with PCP in 1-2 weeks  Home Health: PT Equipment/Devices: none  Discharge Condition: stable CODE STATUS: Full code  HPI: Per admitting MD, Nicholas Oconnell is a 61 y.o. male with medical history significant of Ogilvie syndrome, HTN, CKD3a, RCC s/p partial right nephrectomy, HTN, schizoaffective disorder. Presenting with generalized weakness, poor PO intake and constipation. History is from caregiver at bedside. He reports that the patient has 5 days of weakness and poor appetite. During this time, he has not had any fevers, urinary changes, or vomiting noted. He apparently has a history of tremors; and the weaker that he has felt, the more afraid of falling he has become. He has not had any witnessed falls during this time. His caregiver also reports that he has not had a BM in the last 2 days. When his weakness worsened this morning, they decided to bring him to the ED for evaluation.    Hospital Course / Discharge diagnoses: Principal Problem:   Abdominal distention Active Problems:   CKD (chronic kidney disease), stage III (HCC)   Schizoaffective disorder, bipolar type (HCC)   HTN (hypertension)   Hypocalcemia   Elevated alkaline phosphatase level   Generalized weakness   History of tremor   Constipation   Ogilvie syndrome   Principal problem Acute on chronic constipation, Ogilvie syndrome, abdominal distention-patient was admitted to the hospital with acute on chronic constipation, distended abdomen, nausea and vomiting.  Gastroenterology consulted and followed patient while hospitalized.  He received serial enemas and was placed on aggressive bowel regimen with improvement, has subsequently had significant  BMs with improvement in his abdominal distention.  All his GI symptoms have resolved, he is tolerating a regular diet, and will be discharged back in stable condition with a slightly different bowel regimen at home.  Continue to closely monitor on a daily basis, he will need to have at least 1-2 BMs per day, if not, please do PRN enemas   Active problems Generalized weakness -likely secondary to above.  Working with PT TGP4D -his baseline creatinine is around 1.3-1.5, with most recent 1 2 months ago was 1.7.  Creatinine currently is at baseline HTN Hypocalcemia, vitamin D deficiency-replete vitamin D Elevated alk phos -isolated, unclear significance Schizoaffective disorder Cognitive impairment- continue home regimen as below Hx of tremor - continue home regimen. There is a 11/09/21 note from family medicine that mentions evaluation with neurology. 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 It looks like no action has been taken on this. Will defer to his outpatient psyc and neuro for his regimen going forward  Sepsis ruled out   Discharge Instructions   Allergies as of 01/07/2022       Reactions   Benzodiazepines Other (See Comments)   disinhibition Not on MAR   Clozapine Other (See Comments)   constipation - bowel obstruction Not on MAR        Medication List     STOP taking these medications    mineral oil enema   Polyethylene Glycol 3350 Powd       TAKE these medications    aMILoride 5 MG tablet Commonly known as: MIDAMOR Take 5 mg by mouth daily.   amLODipine 10 MG tablet Commonly known  as: NORVASC Take 10 mg by mouth daily.   divalproex 500 MG 24 hr tablet Commonly known as: Depakote ER Take 2 tablets (1,000 mg total) by mouth at bedtime.   Enema Enem Place 1 Dose rectally daily as needed (constipation).   Ensure Take by mouth 2 (two) times daily as needed (if 50% of meal not  consumed).   haloperidol 20 MG tablet Commonly known as: HALDOL Take 20 mg by mouth 3 (three) times daily.   lactulose 10 GM/15ML solution Commonly known as: CHRONULAC Take 30 mLs (20 g total) by mouth 2 (two) times daily. What changed:  when to take this reasons to take this   lithium carbonate 150 MG capsule Take 150-300 mg by mouth See admin instructions. Take 150 mg by mouth every morning. Take 300 mg by mouth at bedtime.   Melatonin 10 MG Caps Take 10 mg by mouth at bedtime.   pantoprazole 40 MG tablet Commonly known as: PROTONIX Take 40 mg by mouth 2 (two) times daily.   polyethylene glycol 17 g packet Commonly known as: MIRALAX / GLYCOLAX Take 17 g by mouth 2 (two) times daily.   propranolol 10 MG tablet Commonly known as: INDERAL Take 10 mg by mouth 3 (three) times daily.   QUEtiapine 400 MG tablet Commonly known as: SEROQUEL Take 400 mg by mouth 2 (two) times daily.   senna-docusate 8.6-50 MG tablet Commonly known as: Senna S Take 2 tablets by mouth 2 (two) times daily. What changed: when to take this   simethicone 125 MG chewable tablet Commonly known as: MYLICON Chew 854 mg by mouth every 6 (six) hours as needed for flatulence.   Veltassa 8.4 g packet Generic drug: patiromer Take 8.4 g by mouth daily.   Vitamin D (Ergocalciferol) 1.25 MG (50000 UNIT) Caps capsule Commonly known as: DRISDOL Take 1 capsule (50,000 Units total) by mouth every 7 (seven) days. Start taking on: January 11, 2022   zolpidem 10 MG tablet Commonly known as: AMBIEN Take 10 mg by mouth at bedtime.       Consultations: GI  Procedures/Studies:  CT ABDOMEN PELVIS W CONTRAST  Result Date: 01/03/2022 CLINICAL DATA:  Generalized weakness, abdominal pain, decreased appetite, constipation EXAM: CT ABDOMEN AND PELVIS WITH CONTRAST TECHNIQUE: Multidetector CT imaging of the abdomen and pelvis was performed using the standard protocol following bolus administration of intravenous  contrast. RADIATION DOSE REDUCTION: This exam was performed according to the departmental dose-optimization program which includes automated exposure control, adjustment of the mA and/or kV according to patient size and/or use of iterative reconstruction technique. CONTRAST:  2m OMNIPAQUE IOHEXOL 300 MG/ML  SOLN COMPARISON:  06/28/2021 FINDINGS: Lower chest: No acute abnormality. Hepatobiliary: No solid liver abnormality is seen. No gallstones, gallbladder wall thickening, or biliary dilatation. Pancreas: Unremarkable. No pancreatic ductal dilatation or surrounding inflammatory changes. Spleen: Normal in size without significant abnormality. Adrenals/Urinary Tract: Adrenal glands are unremarkable. Kidneys are normal, without renal calculi, solid lesion, or hydronephrosis. Bladder is unremarkable. Stomach/Bowel: Stomach is within normal limits. Evidence of partial left colon resection and reanastomosis. Extremely large burden of stool in the distal colon, stool balls in the distal colon and rectum measuring up to 7.2 cm. Vascular/Lymphatic: No significant vascular findings are present. No enlarged abdominal or pelvic lymph nodes. Reproductive: No mass or other significant abnormality. Other: No abdominal wall hernia or abnormality. No ascites. Musculoskeletal: No acute or significant osseous findings. IMPRESSION: 1. Extremely large burden of stool and large stool balls in the distal colon  and rectum. 2. Evidence of partial left colon resection and reanastomosis. Electronically Signed   By: Delanna Ahmadi M.D.   On: 01/03/2022 12:31   CT Head Wo Contrast  Result Date: 01/03/2022 CLINICAL DATA:  Generalized weakness, fatigue, decreased appetite. EXAM: CT HEAD WITHOUT CONTRAST TECHNIQUE: Contiguous axial images were obtained from the base of the skull through the vertex without intravenous contrast. RADIATION DOSE REDUCTION: This exam was performed according to the departmental dose-optimization program which  includes automated exposure control, adjustment of the mA and/or kV according to patient size and/or use of iterative reconstruction technique. COMPARISON:  CT head 12/12/2018 FINDINGS: Brain: There is no acute intracranial hemorrhage, extra-axial fluid collection, or acute infarct. Parenchymal volume is normal. The ventricles are stable in size. Gray-white differentiation is preserved There is no mass lesion.  There is no mass effect or midline shift. Vascular: No hyperdense vessel or unexpected calcification. Skull: Normal. Negative for fracture or focal lesion. Sinuses/Orbits: The paranasal sinuses are clear. The globes and orbits are unremarkable. Other: None. IMPRESSION: No acute intracranial pathology. Electronically Signed   By: Valetta Mole M.D.   On: 01/03/2022 10:37   DG ABD ACUTE 2+V W 1V CHEST  Result Date: 01/03/2022 CLINICAL DATA:  Abdominal pain. Generalized weakness, fatigue, and decreased appetite. EXAM: DG ABDOMEN ACUTE WITH 1 VIEW CHEST COMPARISON:  AP chest 11/08/2021; KUB 07/05/2021; CT abdomen and pelvis FINDINGS: Mildly decreased lung volumes. Cardiac silhouette and mediastinal contours are within normal limits. The lungs are clear. No pleural effusion or pneumothorax. Mild multilevel degenerative disc changes of the thoracic spine. Surgical clips overlie the left upper abdominal quadrant. The sigmoid colon is highly distended with air, however this appears similar to 07/05/2021 frontal radiograph. There is also new high-grade stool within the rectum and sigmoid colon concerning for fecal impaction. High-grade stool is also seen throughout the ascending and transverse colon. Note is made the patient appears to be status post partial colectomy on prior CT. No definite dilated loops of small bowel to indicate bowel obstruction. Vascular phleboliths again overlie the right hemipelvis. No subdiaphragmatic free air is seen. IMPRESSION: Note is made of postsurgical change of partial colectomy on  prior CT. There is air-filled distention of the postsurgical colon which is very similar to multiple prior radiographs and 06/28/2021 CT. There is also high-grade stool seen within the rectum and postsurgical remaining colon. Findings are suspicious for constipation and possible rectal impaction. Electronically Signed   By: Yvonne Kendall M.D.   On: 01/03/2022 10:08   CT Head Wo Contrast  Result Date: 12/11/2021 CLINICAL DATA:  Trauma. EXAM: CT HEAD WITHOUT CONTRAST CT CERVICAL SPINE WITHOUT CONTRAST TECHNIQUE: Multidetector CT imaging of the head and cervical spine was performed following the standard protocol without intravenous contrast. Multiplanar CT image reconstructions of the cervical spine were also generated. RADIATION DOSE REDUCTION: This exam was performed according to the departmental dose-optimization program which includes automated exposure control, adjustment of the mA and/or kV according to patient size and/or use of iterative reconstruction technique. COMPARISON:  Head CT dated 11/28/2021 and cervical spine CT dated 09/07/2021. FINDINGS: CT HEAD FINDINGS Brain: Generalized parenchymal volume loss with commensurate dilatation of the ventricles and sulci, stable. Mild chronic small vessel ischemic changes within the deep periventricular and subcortical white matter regions. No mass, hemorrhage, edema or other evidence of acute parenchymal abnormality. No extra-axial hemorrhage. Vascular: No hyperdense vessel or unexpected calcification. Skull: Normal. Negative for fracture or focal lesion. Sinuses/Orbits: No acute finding. Other: None. CT CERVICAL  SPINE FINDINGS Alignment: Stable.  No evidence of acute vertebral body subluxation. Skull base and vertebrae: No fracture line or displaced fracture fragment is seen. Soft tissues and spinal canal: No prevertebral fluid or swelling. No visible canal hematoma. Disc levels: Degenerative spondylosis throughout the cervical spine, mild to moderate in  degree, with moderate central canal stenoses at the C2-C3, C3-C4 and C4-C5 levels. There is additional degenerative facet and uncovertebral joint hypertrophy causing moderate bilateral neural foramen stenoses at C3-4 and C4-5 with possible associated nerve root impingement. Upper chest: Negative. Other: None. IMPRESSION: 1. No acute intracranial abnormality. No intracranial mass, hemorrhage or edema. No skull fracture. 2. No fracture or acute subluxation within the cervical spine. 3. Degenerative changes of the cervical spine, mild to moderate in degree, stable compared to previous study of 09/07/2021, as detailed above. If any chronic upper extremity radiculopathy symptoms, would consider nonemergent cervical spine MRI to exclude associated nerve root impingement. Electronically Signed   By: Franki Cabot M.D.   On: 12/11/2021 10:39   CT Cervical Spine Wo Contrast  Result Date: 12/11/2021 CLINICAL DATA:  Trauma. EXAM: CT HEAD WITHOUT CONTRAST CT CERVICAL SPINE WITHOUT CONTRAST TECHNIQUE: Multidetector CT imaging of the head and cervical spine was performed following the standard protocol without intravenous contrast. Multiplanar CT image reconstructions of the cervical spine were also generated. RADIATION DOSE REDUCTION: This exam was performed according to the departmental dose-optimization program which includes automated exposure control, adjustment of the mA and/or kV according to patient size and/or use of iterative reconstruction technique. COMPARISON:  Head CT dated 11/28/2021 and cervical spine CT dated 09/07/2021. FINDINGS: CT HEAD FINDINGS Brain: Generalized parenchymal volume loss with commensurate dilatation of the ventricles and sulci, stable. Mild chronic small vessel ischemic changes within the deep periventricular and subcortical white matter regions. No mass, hemorrhage, edema or other evidence of acute parenchymal abnormality. No extra-axial hemorrhage. Vascular: No hyperdense vessel or  unexpected calcification. Skull: Normal. Negative for fracture or focal lesion. Sinuses/Orbits: No acute finding. Other: None. CT CERVICAL SPINE FINDINGS Alignment: Stable.  No evidence of acute vertebral body subluxation. Skull base and vertebrae: No fracture line or displaced fracture fragment is seen. Soft tissues and spinal canal: No prevertebral fluid or swelling. No visible canal hematoma. Disc levels: Degenerative spondylosis throughout the cervical spine, mild to moderate in degree, with moderate central canal stenoses at the C2-C3, C3-C4 and C4-C5 levels. There is additional degenerative facet and uncovertebral joint hypertrophy causing moderate bilateral neural foramen stenoses at C3-4 and C4-5 with possible associated nerve root impingement. Upper chest: Negative. Other: None. IMPRESSION: 1. No acute intracranial abnormality. No intracranial mass, hemorrhage or edema. No skull fracture. 2. No fracture or acute subluxation within the cervical spine. 3. Degenerative changes of the cervical spine, mild to moderate in degree, stable compared to previous study of 09/07/2021, as detailed above. If any chronic upper extremity radiculopathy symptoms, would consider nonemergent cervical spine MRI to exclude associated nerve root impingement. Electronically Signed   By: Franki Cabot M.D.   On: 12/11/2021 10:39     Subjective: - no chest pain, shortness of breath, no abdominal pain, nausea or vomiting.   Discharge Exam: BP 114/74   Pulse 99   Temp 99.9 F (37.7 C) (Oral)   Resp 14   Ht '5\' 8"'  (1.727 m)   Wt 70.3 kg   SpO2 93%   BMI 23.57 kg/m   General: Pt is alert, awake, not in acute distress Cardiovascular: RRR, S1/S2 +, no rubs, no  gallops Respiratory: CTA bilaterally, no wheezing, no rhonchi Abdominal: Soft, NT, ND, bowel sounds + Extremities: no edema, no cyanosis    The results of significant diagnostics from this hospitalization (including imaging, microbiology, ancillary and  laboratory) are listed below for reference.     Microbiology: No results found for this or any previous visit (from the past 240 hour(s)).   Labs: Basic Metabolic Panel: Recent Labs  Lab 01/03/22 0928 01/03/22 2122 01/04/22 0450  NA 139  --  138  K 4.6  --  4.6  CL 111  --  111  CO2 21*  --  21*  GLUCOSE 88  --  87  BUN 21  --  16  CREATININE 1.64*  --  1.53*  CALCIUM 7.9* 9.5 8.7*  MG  --  2.5*  --    Liver Function Tests: Recent Labs  Lab 01/03/22 0928 01/04/22 0450  AST 20 17  ALT 14 13  ALKPHOS 170* 139*  BILITOT 0.5 0.4  PROT 7.6 6.4*  ALBUMIN 3.8 3.1*   CBC: Recent Labs  Lab 01/03/22 0928 01/04/22 0450  WBC 4.6 5.8  NEUTROABS 3.1  --   HGB 15.1 13.1  HCT 47.6 40.4  MCV 94.8 94.0  PLT 211 243   CBG: No results for input(s): "GLUCAP" in the last 168 hours. Hgb A1c No results for input(s): "HGBA1C" in the last 72 hours. Lipid Profile No results for input(s): "CHOL", "HDL", "LDLCALC", "TRIG", "CHOLHDL", "LDLDIRECT" in the last 72 hours. Thyroid function studies No results for input(s): "TSH", "T4TOTAL", "T3FREE", "THYROIDAB" in the last 72 hours.  Invalid input(s): "FREET3" Urinalysis    Component Value Date/Time   COLORURINE STRAW (A) 01/03/2022 1641   APPEARANCEUR CLEAR 01/03/2022 1641   LABSPEC 1.028 01/03/2022 1641   PHURINE 6.0 01/03/2022 1641   GLUCOSEU NEGATIVE 01/03/2022 1641   HGBUR NEGATIVE 01/03/2022 1641   BILIRUBINUR NEGATIVE 01/03/2022 1641   KETONESUR NEGATIVE 01/03/2022 1641   PROTEINUR NEGATIVE 01/03/2022 1641   NITRITE NEGATIVE 01/03/2022 1641   LEUKOCYTESUR NEGATIVE 01/03/2022 1641    FURTHER DISCHARGE INSTRUCTIONS:   Get Medicines reviewed and adjusted: Please take all your medications with you for your next visit with your Primary MD   Laboratory/radiological data: Please request your Primary MD to go over all hospital tests and procedure/radiological results at the follow up, please ask your Primary MD to get all  Hospital records sent to his/her office.   In some cases, they will be blood work, cultures and biopsy results pending at the time of your discharge. Please request that your primary care M.D. goes through all the records of your hospital data and follows up on these results.   Also Note the following: If you experience worsening of your admission symptoms, develop shortness of breath, life threatening emergency, suicidal or homicidal thoughts you must seek medical attention immediately by calling 911 or calling your MD immediately  if symptoms less severe.   You must read complete instructions/literature along with all the possible adverse reactions/side effects for all the Medicines you take and that have been prescribed to you. Take any new Medicines after you have completely understood and accpet all the possible adverse reactions/side effects.    Do not drive when taking Pain medications or sleeping medications (Benzodaizepines)   Do not take more than prescribed Pain, Sleep and Anxiety Medications. It is not advisable to combine anxiety,sleep and pain medications without talking with your primary care practitioner   Special Instructions: If you have smoked  or chewed Tobacco  in the last 2 yrs please stop smoking, stop any regular Alcohol  and or any Recreational drug use.   Wear Seat belts while driving.   Please note: You were cared for by a hospitalist during your hospital stay. Once you are discharged, your primary care physician will handle any further medical issues. Please note that NO REFILLS for any discharge medications will be authorized once you are discharged, as it is imperative that you return to your primary care physician (or establish a relationship with a primary care physician if you do not have one) for your post hospital discharge needs so that they can reassess your need for medications and monitor your lab values.  Time coordinating discharge: 40  minutes  SIGNED:  Marzetta Board, MD, PhD 01/07/2022, 7:15 AM

## 2022-01-06 NOTE — TOC Progression Note (Addendum)
Transition of Care [facility]) - Progression Note    Patient Details  Name: Nicholas Oconnell MRN: [id number] Date of Birth: 1960/10/17  [facility] of [facility] CM/SW [place], LCSW Phone Number: 01/06/2022, 1:43 PM  Clinical Narrative:     CSW spoke with pt's [place] to inquire if pt can return to [facility], she reported pt can. CSW informed LG about the attempts to contact [facility] without success. Pt's LG reported she will attempt to contact [facility] staff and contact CSW back. Pt's LG reported speaking with the [facility] they stated they would be able to pick pt up prior to 11 am today and is not sure they can get pt now. CSW will continue to make attempts to contact [facility]. TOC to follow.  Adden 2:08 pm  CSW spoke with Herbie Baltimore staff member at [facility], he reported they do not have staff to pick pt up today. Herbie Baltimore reported the [facility] can provided transportation tomorrow around 12 pm.   Expected Discharge Plan: Group Home Barriers to Discharge: No Barriers Identified  Expected Discharge Plan and Services Expected Discharge Plan: Group Home In-house Referral: Clinical Social Work Discharge Planning Services: CM Consult Post Acute Care Choice: Home Health Living arrangements for the past 2 months: Group Home                 DME Arranged: N/A DME Agency: NA                   Social Determinants of Health (SDOH) Interventions    Readmission Risk Interventions     No data to display

## 2022-01-07 DIAGNOSIS — R140 Abdominal distension (gaseous): Secondary | ICD-10-CM | POA: Diagnosis not present

## 2022-01-07 MED ORDER — HALOPERIDOL 5 MG PO TABS
5.0000 mg | ORAL_TABLET | Freq: Three times a day (TID) | ORAL | Status: DC
  Administered 2022-01-07 – 2022-01-09 (×5): 5 mg via ORAL
  Filled 2022-01-07 (×5): qty 1

## 2022-01-07 MED ORDER — ALUM & MAG HYDROXIDE-SIMETH 200-200-20 MG/5ML PO SUSP
30.0000 mL | Freq: Four times a day (QID) | ORAL | Status: DC | PRN
  Administered 2022-01-07: 30 mL via ORAL
  Filled 2022-01-07: qty 30

## 2022-01-07 MED ORDER — QUETIAPINE FUMARATE 100 MG PO TABS
200.0000 mg | ORAL_TABLET | Freq: Every day | ORAL | Status: DC
  Administered 2022-01-08: 200 mg via ORAL
  Filled 2022-01-07: qty 2

## 2022-01-07 NOTE — Progress Notes (Signed)
Pt discharge has been cancelled. Nicholas Oconnell has come to pt's room from [facility] and has alerted Probation officer and MD that pt is not himself and is drooling and unable to walk. MD present and aware.  Pt's legal guardian, [name] his sister, has been notified by Probation officer. Pt will remain in Room 1511. New orders received from MD and notified.

## 2022-01-07 NOTE — Progress Notes (Signed)
Have spoken with Susa Simmonds of [facility] twice today concerning faxing a copy of patient's med list. At this time, Susa Simmonds stating that they have to call a "[employer]" for the group home to get a copy of the list and that the nurses line has yet to return the call. Reiterated the need for this list. Susa Simmonds has the fax number. Will continue to await fax.

## 2022-01-07 NOTE — Progress Notes (Signed)
Writer has spoken with [facility] staff member, Synethia. Herbie Baltimore is on his way to pick pt up. Report has been given to Synethia. Herbie Baltimore will come to pt's room 1511 and writer will give verbal report to him as well. Discharge packet will be given to [facility] of [facility].

## 2022-01-07 NOTE — Progress Notes (Signed)
PROGRESS NOTE  Nicholas Oconnell MOQ:[id number] DOB: 07/28/1960 DOA: 01/03/2022 PCP: Neale Burly, MD   LOS: 3 days   Brief Narrative / Interim history:  61 y.o. male with medical history significant of Ogilvie syndrome, HTN, CKD3a, RCC s/p partial right nephrectomy, HTN, schizoaffective disorder. Presenting with generalized weakness, poor PO intake and constipation. History is from caregiver at bedside. He reports that the patient has 5 days of weakness and poor appetite. During this time, he has not had any fevers, urinary changes, or vomiting note.  Subjective / 24h Interval events: He feels well this morning, no abdominal pain, no nausea or vomiting  He was supposed to be discharged today, however when the worker from the group home came to pick him up, he noted that the patient was not at baseline, weaker than usual, drooling, so discharge was canceled.  He tells me that he does this at the group home occasionally, and that when that happens his medications are cut down, and that when he starts acting up or being agitated or not sleeping, his medications are increased again.  Assesement and Plan: Principal Problem:   Abdominal distention Active Problems:   CKD (chronic kidney disease), stage III (HCC)   Schizoaffective disorder, bipolar type (HCC)   HTN (hypertension)   Hypocalcemia   Elevated alkaline phosphatase level   Generalized weakness   History of tremor   Constipation   Ogilvie syndrome   Principal problem Acute on chronic constipation, Ogilvie syndrome, abdominal distention-on admission he was placed on a bowel regimen, he also received an enema with some improvement overnight.  His abdomen is softer today.  Continue bowel regimen, continue diet  Active problems Generalized weakness -likely secondary to medications.  Apparently, as discussed above, he does this at the group home and when that happens to cut down the medications.  When he starts being agitated or  not sleeping his medications are increased again.  We will decrease the Seroquel and Haldol   CKD3a -his baseline creatinine is around 1.3-1.5, with most recent 1 2 months ago was 1.7.  Creatinine currently is at baseline   HTN -normotensive, monitor   Hypocalcemia, vitamin D deficiency-replete vitamin D   Elevated alk phos -isolated, unclear significance   Schizoaffective disorder Cognitive impairment- continue home regimen as below.  Decrease Seroquel and Haldol due to oversedation   Hx of tremor - continue home regimen. There is a 11/09/21 note from family medicine that mentions evaluation with neurology. 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 It looks like no action has been taken on this. Will defer to his outpatient psyc and neuro for his regimen going forward  Scheduled Meds:  divalproex  1,000 mg Oral QHS   enoxaparin (LOVENOX) injection  40 mg Subcutaneous Q24H   feeding supplement  1 Container Oral TID BM   haloperidol  5 mg Oral TID   lithium carbonate  150 mg Oral Daily   lithium carbonate  300 mg Oral QHS   polyethylene glycol  17 g Oral Daily   [START ON 01/08/2022] QUEtiapine  200 mg Oral QHS   senna-docusate  1 tablet Oral BID   Vitamin D (Ergocalciferol)  50,000 Units Oral Q7 days   zolpidem  10 mg Oral QHS   Continuous Infusions:   PRN Meds:.hydrALAZINE, ondansetron (ZOFRAN) IV  Diet Orders (From admission, onward)     Start     Ordered   01/06/22 0913  DIET SOFT Room service  appropriate? Yes; Fluid consistency: Thin  Diet effective now       Question Answer Comment  Room service appropriate? Yes   Fluid consistency: Thin      01/06/22 0912            DVT prophylaxis: enoxaparin (LOVENOX) injection 40 mg Start: 01/03/22 1800   Lab Results  Component Value Date   PLT 243 01/04/2022      Code Status: Full Code  Family Communication: no family at bedside   Status  is: Inpatient  Remains inpatient appropriate because: persistent abdominal distention   Level of care: Med-Surg  Consultants:  GI  Objective: Vitals:   01/06/22 0637 01/06/22 1240 01/06/22 1925 01/07/22 0417  BP: 123/71 123/75 133/81 114/74  Pulse: 72 75 79 99  Resp: _0 Temp: 98.1 F (36.7 C) 98.8 F (37.1 C) 99.8 F (37.7 C) 99.9 F (37.7 C)  TempSrc: Oral Oral Oral Oral  SpO2: 95% 97% 97% 93%  Weight:      Height:        Intake/Output Summary (Last 24 hours) at 01/07/2022 1207 Last data filed at 01/06/2022 2135 Gross per 24 hour  Intake 370 ml  Output 600 ml  Net -230 ml    Wt Readings from Last 3 Encounters:  01/03/22 70.3 kg  11/28/21 70.3 kg  09/07/21 83.9 kg    Examination: Constitutional: NAD Eyes: lids and conjunctivae normal, no scleral icterus ENMT: mmm Neck: normal, supple Respiratory: clear to auscultation bilaterally, no wheezing, no crackles.  Cardiovascular: Regular rate and rhythm, no murmurs / rubs / gallops.  Abdomen: soft, no distention, no tenderness. Bowel sounds positive.  Skin: no rashes Neurologic: no focal deficits, equal strength  Data Reviewed: I have independently reviewed following labs and imaging studies   CBC Recent Labs  Lab 01/03/22 0928 01/04/22 0450  WBC 4.6 5.8  HGB 15.1 13.1  HCT 47.6 40.4  PLT 211 243  MCV 94.8 94.0  MCH 30.1 30.5  MCHC 31.7 32.4  RDW 13.3 13.2  LYMPHSABS 1.1  --   MONOABS 0.3  --   EOSABS 0.1  --   BASOSABS 0.0  --      Recent Labs  Lab 01/03/22 0842 01/03/22 0928 01/03/22 2122 01/04/22 0450  NA  --  139  --  138  K  --  4.6  --  4.6  CL  --  111  --  111  CO2  --  21*  --  21*  GLUCOSE  --  88  --  87  BUN  --  21  --  16  CREATININE  --  1.64*  --  1.53*  CALCIUM  --  7.9* 9.5 8.7*  AST  --  20  --  17  ALT  --  14  --  13  ALKPHOS  --  170*  --  139*  BILITOT  --  0.5  --  0.4  ALBUMIN  --  3.8  --  3.1*  MG  --   --  2.5*  --   AMMONIA 26  --   --   --       ------------------------------------------------------------------------------------------------------------------ No results for input(s): "CHOL", "HDL", "LDLCALC", "TRIG", "CHOLHDL", "LDLDIRECT" in the last 72 hours.  No results found for: "HGBA1C" ------------------------------------------------------------------------------------------------------------------ No results for input(s): "TSH", "T4TOTAL", "T3FREE", "THYROIDAB" in the last 72 hours.  Invalid input(s): "FREET3"  Cardiac Enzymes No results for input(s): "CKMB", "TROPONINI", "MYOGLOBIN" in the  last 168 hours.  Invalid input(s): "CK" ------------------------------------------------------------------------------------------------------------------ No results found for: "BNP"  CBG: No results for input(s): "GLUCAP" in the last 168 hours.  No results found for this or any previous visit (from the past 240 hour(s)).   Radiology Studies: No results found.   Marzetta Board, MD, PhD [facility]  Between 7 am - 7 pm I am available, please contact me via Amion (for emergencies) or Securechat (non urgent messages)  Between 7 pm - 7 am I am not available, please contact night coverage MD/APP via Amion

## 2022-01-07 NOTE — Plan of Care (Signed)
Pt will be discharged to [facility], with [facility].

## 2022-01-07 NOTE — TOC Progression Note (Signed)
Transition of Care [facility]) - Progression Note    Patient Details  Name: Nicholas Oconnell MRN: [id number] Date of Birth: [birth year]  Transition [facility] Care [facility]) CM/SW Contact  Aniel Hubble, Juliann Pulse, RN Phone Number: 01/07/2022, 2:43 PM  Clinical Narrative: noted d/c cancelled-not medically stable for d/c back to Group Home. Cheri(sister) aware & prefers once @ baseline to return back to Group Home.     Expected Discharge Plan: Group Home Barriers to Discharge: Continued Medical Work up  Expected Discharge Plan and Services Expected Discharge Plan: Group Home In-house Referral: Clinical Social Work Discharge Planning Services: CM Consult Post Acute Care Choice: [place] arrangements for the past 2 months: Group Home Expected Discharge Date: 01/07/22               DME Arranged: N/A DME Agency: NA                   Social Determinants of Health (SDOH) Interventions    Readmission Risk Interventions     No data to display

## 2022-01-07 NOTE — TOC Transition Note (Addendum)
Transition of Care [facility]) - CM/SW Discharge Note   Patient Details  Name: Toma Arts MRN: [id number] Date of Birth: 1960/09/12  Transition of Care ) CM/SW Contact:  Dessa Phi, RN Phone Number: 01/07/2022, 10:44 AM   Clinical Narrative:  Per prior TOC notes-have already spoken to [place] d/c plan today p/u at 12p. I have spoken to Ray also aware of d/c plan return back w/HHC services. Printed HHC orders,face to face,d/c summary in packet. Nsg to call [facility] for report tel#[phone]. No further CM needs. -10:53a-spoke to sister Delsa Grana states that she is aware & Group Home is aware of he current condition PT recc SNF & agree to him returning back to [place].    Final next level of care: Group Home Barriers to Discharge: No Barriers Identified   Patient Goals and CMS Choice Patient states their goals for this hospitalization and ongoing recovery are:: To return home   Choice offered to / list presented to : Patient, [place] / Guardian  Discharge Placement                       Discharge Plan and Services In-house Referral: Clinical Social Work Discharge Planning Services: CM Consult Post Acute Care Choice: Home Health          DME Arranged: N/A DME Agency: NA                  Social Determinants of Health (SDOH) Interventions     Readmission Risk Interventions     No data to display

## 2022-01-08 DIAGNOSIS — R140 Abdominal distension (gaseous): Secondary | ICD-10-CM | POA: Diagnosis not present

## 2022-01-08 LAB — GLUCOSE, CAPILLARY: Glucose-Capillary: 57 mg/dL — ABNORMAL LOW (ref 70–99)

## 2022-01-08 MED ORDER — SORBITOL 70 % SOLN
960.0000 mL | TOPICAL_OIL | Freq: Once | ORAL | Status: DC
  Filled 2022-01-08: qty 473

## 2022-01-08 NOTE — TOC Progression Note (Addendum)
Transition of Care [facility]) - Progression Note    Patient Details  Name: Nicholas Oconnell MRN: [id number] Date of Birth: [birth year]  Transition of Care [facility]) CM/SW [place], [place] Phone Number: 01/08/2022, 10:05 AM  Clinical Narrative:    Spoke with sister, [name] and provided updates and plan for discharge pending ambulation this afternoon. Pt's sister had questions regarding food intake and pt getting out of bed; CSW deferred to RN for these questions. RN has been notified.   CSW has attempted to contact group home. CSW will continue to reach out for pt to return to their group home.    Update 1035: CSW contacted group home who state that they will be able to provide transportation this afternoon if pt is discharged.   Update 1130: Pt unable to ambulate with PT and required +2 assist to transfer from bed to recliner. Pt requiring SNF placement. CSW spoke with group home to inform of change in discharge plans. CSW spoke with pt's sister/legal guardian who is agreeable to SNF placement for this pt and is aware pt will have to sign his SSI check over to SNF facility. She states that pt's group home is currently over his funds and that they will be able to sign his check over to facility he discharges to. Pt has been faxed out for SNF placement and currently awaiting bed offers. Pt's PASRR is pending.   Update 1250: Pt at level II PASRR determination.    Expected Discharge Plan: Group Home Barriers to Discharge: Continued Medical Work up  Expected Discharge Plan and Services Expected Discharge Plan: Group Home In-house Referral: Clinical Social Work Discharge Planning Services: CM Consult Post Acute Care Choice: [place] arrangements for the past 2 months: Group Home Expected Discharge Date: 01/07/22               DME Arranged: N/A DME Agency: NA                   Social Determinants of Health (SDOH) Interventions    Readmission Risk Interventions     01/08/2022   10:05 AM  Readmission Risk Prevention Plan  Transportation Screening Complete  PCP or Specialist Appt within 5-7 Days Complete  Home Care Screening Complete  Medication Review (RN CM) Complete

## 2022-01-08 NOTE — Progress Notes (Signed)
PROGRESS NOTE  Nicholas Oconnell QPY:[id number] DOB: [birth year] DOA: 01/03/2022 PCP: Neale Burly, MD   LOS: 4 days   Brief Narrative / Interim history:  61 y.o. male with medical history significant of Ogilvie syndrome, HTN, CKD3a, RCC s/p partial right nephrectomy, HTN, schizoaffective disorder. Presenting with generalized weakness, poor PO intake and constipation. History is from caregiver at bedside. He reports that the patient has 5 days of weakness and poor appetite. During this time, he has not had any fevers, urinary changes, or vomiting note.  Subjective / 24h Interval events: Mild abdominal discomfort, no nausea or vomiting.  Assesement and Plan: Principal Problem:   Abdominal distention Active Problems:   CKD (chronic kidney disease), stage III (HCC)   Schizoaffective disorder, bipolar type (HCC)   HTN (hypertension)   Hypocalcemia   Elevated alkaline phosphatase level   Generalized weakness   History of tremor   Constipation   Ogilvie syndrome   Principal problem Acute on chronic constipation, Ogilvie syndrome, abdominal distention-on admission he was placed on a bowel regimen, he also received an enema with some improvement overnight.  His abdomen is softer today.  Continue bowel regimen, continue diet.  Repeat enema today  Active problems Generalized weakness -likely secondary to medications.  Apparently, as discussed above, he does this at the group home and when that happens to cut down the medications.  When he starts being agitated or not sleeping his medications are increased again.  Seroquel and Haldol decreased 8/6.  Seems to be much more alert today but remains weak.  PT recommends SNF   CKD3a -his baseline creatinine is around 1.3-1.5, with most recent 1 2 months ago was 1.7.  Creatinine at baseline.  Repeat tomorrow morning   HTN -normotensive, monitor   Hypocalcemia, vitamin D deficiency-replete vitamin D   Elevated alk phos -isolated, unclear  significance   Schizoaffective disorder Cognitive impairment- continue home regimen as below.  Decrease Seroquel and Haldol due to oversedation   Hx of tremor - continue home regimen. There is a 11/09/21 note from family medicine that mentions evaluation with neurology. 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 It looks like no action has been taken on this. Will defer to his outpatient psyc and neuro for his regimen going forward  Scheduled Meds:  divalproex  1,000 mg Oral QHS   enoxaparin (LOVENOX) injection  40 mg Subcutaneous Q24H   feeding supplement  1 Container Oral TID BM   haloperidol  5 mg Oral TID   lithium carbonate  150 mg Oral Daily   lithium carbonate  300 mg Oral QHS   polyethylene glycol  17 g Oral Daily   QUEtiapine  200 mg Oral QHS   senna-docusate  1 tablet Oral BID   sorbitol, milk of mag, mineral oil, glycerin (SMOG) enema  960 mL Rectal Once   Vitamin D (Ergocalciferol)  50,000 Units Oral Q7 days   zolpidem  10 mg Oral QHS   Continuous Infusions:   PRN Meds:.alum & mag hydroxide-simeth, hydrALAZINE, ondansetron (ZOFRAN) IV  Diet Orders (From admission, onward)     Start     Ordered   01/06/22 0913  DIET SOFT Room service appropriate? Yes; Fluid consistency: Thin  Diet effective now       Question Answer Comment  Room service appropriate? Yes   Fluid consistency: Thin      01/06/22 0912            DVT prophylaxis:  enoxaparin (LOVENOX) injection 40 mg Start: 01/03/22 1800   Lab Results  Component Value Date   PLT 243 01/04/2022      Code Status: Full Code  Family Communication: no family at bedside   Status is: Inpatient  Remains inpatient appropriate because: persistent abdominal distention   Level of care: Med-Surg  Consultants:  GI  Objective: Vitals:   01/07/22 0417 01/07/22 1222 01/07/22 2046 01/08/22 0425  BP: 114/74 119/82 (!) 129/95 137/76   Pulse: 99 90 94 83  Resp: 14 (!) '22 20 17  ' Temp: 99.9 F (37.7 C) 98.1 F (36.7 C) 99.6 F (37.6 C) 99.2 F (37.3 C)  TempSrc: Oral Oral    SpO2: 93% 92% 92% 90%  Weight:      Height:        Intake/Output Summary (Last 24 hours) at 01/08/2022 1115 Last data filed at 01/08/2022 0900 Gross per 24 hour  Intake 240 ml  Output 800 ml  Net -560 ml    Wt Readings from Last 3 Encounters:  01/03/22 70.3 kg  11/28/21 70.3 kg  09/07/21 83.9 kg    Examination: Constitutional: NAD Eyes: lids and conjunctivae normal, no scleral icterus ENMT: mmm Neck: normal, supple Respiratory: clear to auscultation bilaterally, no wheezing, no crackles.  Cardiovascular: Regular rate and rhythm, no murmurs / rubs / gallops. No LE edema. Abdomen: soft, no distention, no tenderness. Bowel sounds positive.  Skin: no rashes Neurologic: no focal deficits, equal strength  Data Reviewed: I have independently reviewed following labs and imaging studies   CBC Recent Labs  Lab 01/03/22 0928 01/04/22 0450  WBC 4.6 5.8  HGB 15.1 13.1  HCT 47.6 40.4  PLT 211 243  MCV 94.8 94.0  MCH 30.1 30.5  MCHC 31.7 32.4  RDW 13.3 13.2  LYMPHSABS 1.1  --   MONOABS 0.3  --   EOSABS 0.1  --   BASOSABS 0.0  --      Recent Labs  Lab 01/03/22 0842 01/03/22 0928 01/03/22 2122 01/04/22 0450  NA  --  139  --  138  K  --  4.6  --  4.6  CL  --  111  --  111  CO2  --  21*  --  21*  GLUCOSE  --  88  --  87  BUN  --  21  --  16  CREATININE  --  1.64*  --  1.53*  CALCIUM  --  7.9* 9.5 8.7*  AST  --  20  --  17  ALT  --  14  --  13  ALKPHOS  --  170*  --  139*  BILITOT  --  0.5  --  0.4  ALBUMIN  --  3.8  --  3.1*  MG  --   --  2.5*  --   AMMONIA 26  --   --   --      ------------------------------------------------------------------------------------------------------------------ No results for input(s): "CHOL", "HDL", "LDLCALC", "TRIG", "CHOLHDL", "LDLDIRECT" in the last 72 hours.  No results found  for: "HGBA1C" ------------------------------------------------------------------------------------------------------------------ No results for input(s): "TSH", "T4TOTAL", "T3FREE", "THYROIDAB" in the last 72 hours.  Invalid input(s): "FREET3"  Cardiac Enzymes No results for input(s): "CKMB", "TROPONINI", "MYOGLOBIN" in the last 168 hours.  Invalid input(s): "CK" ------------------------------------------------------------------------------------------------------------------ No results found for: "BNP"  CBG: No results for input(s): "GLUCAP" in the last 168 hours.  No results found for this or any previous visit (from the past 240 hour(s)).   Radiology Studies:  No results found.   Marzetta Board, MD, PhD [facility]  Between 7 am - 7 pm I am available, please contact me via Amion (for emergencies) or Securechat (non urgent messages)  Between 7 pm - 7 am I am not available, please contact night coverage MD/APP via Amion

## 2022-01-08 NOTE — NC FL2 (Signed)
[place] LEVEL OF CARE SCREENING TOOL     IDENTIFICATION  Patient Name: Nicholas Oconnell Birthdate: 1960-11-20 Sex: male Admission Date (Current Location): 01/03/2022  [place] and [place] Number:  Nicholas Oconnell [id number] [place] and Address:  [facility],  [place], [place]      Provider Number: [id number]  Attending Physician Name and Address:  Nicholas Griffins, MD  Relative Name and Phone Number:  Nicholas Oconnell [phone]    Current Level of Care: Hospital Recommended Level of Care: [place] Prior Approval Number:    Date Approved/Denied:   PASRR Number: Pending  Discharge Plan: SNF    Current Diagnoses: Patient Active Problem List   Diagnosis Date Noted   Ogilvie syndrome 01/04/2022   Abdominal distention 01/03/2022   HTN (hypertension) 01/03/2022   Hypocalcemia 01/03/2022   Elevated alkaline phosphatase level 01/03/2022   Generalized weakness 01/03/2022   History of tremor 01/03/2022   Constipation 01/03/2022   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   Aspiration pneumonitis ([place]) 07/03/2021   GI bleeding 06/28/2021   CKD (chronic kidney disease), stage III (HCC)    Pneumonia    Cardiomegaly    Schizoaffective disorder, bipolar type (HCC)    Acute pseudo-obstruction of bowel    Acute encephalopathy     Orientation RESPIRATION BLADDER Height & Weight     Self, Place  Normal Incontinent, External catheter Weight: 154 lb 15.7 oz (70.3 kg) Height:  '5\' 8"'$  (172.7 cm)  BEHAVIORAL SYMPTOMS/MOOD NEUROLOGICAL BOWEL NUTRITION STATUS      Incontinent Diet (Regular)  AMBULATORY STATUS COMMUNICATION OF NEEDS Skin   Extensive Assist Verbally Normal                       Personal Care Assistance Level of Assistance  Bathing, Dressing, Feeding Bathing Assistance: Limited assistance Feeding assistance: Independent Dressing Assistance: Limited assistance     Functional  Limitations Info  Sight, Hearing, Speech Sight Info: Adequate Hearing Info: Adequate Speech Info: Impaired    SPECIAL CARE FACTORS FREQUENCY  PT (By licensed PT), OT (By licensed OT)     PT Frequency: 5x/wk OT Frequency: 5x/wk            Contractures Contractures Info: Not present    Additional Factors Info  Code Status, Allergies, Psychotropic Code Status Info: FULL Allergies Info: Benzodiazepines, Clozapine Psychotropic Info: See MAR         Current Medications (01/08/2022):  This is the current hospital active medication list Current Facility-Administered Medications  Medication Dose Route Frequency Provider Last Rate Last Admin   alum & mag hydroxide-simeth (MAALOX/MYLANTA) 200-200-20 MG/5ML suspension 30 mL  30 mL Oral Q6H PRN Nicholas Griffins, MD   30 mL at 01/07/22 1620   divalproex (DEPAKOTE ER) 24 hr tablet 1,000 mg  1,000 mg Oral QHS Kyle, Tyrone A, DO   1,000 mg at 01/07/22 2150   enoxaparin (LOVENOX) injection 40 mg  40 mg Subcutaneous Q24H Kyle, Tyrone A, DO   40 mg at 01/07/22 1721   feeding supplement (BOOST / RESOURCE BREEZE) liquid 1 Container  1 Container Oral TID BM Nicholas Griffins, MD   1 Container at 01/08/22 0914   haloperidol (HALDOL) tablet 5 mg  5 mg Oral TID Nicholas Griffins, MD   5 mg at 01/08/22 0914   hydrALAZINE (APRESOLINE) injection 10 mg  10 mg Intravenous Q8H PRN Cherylann Ratel A, DO       lithium  carbonate capsule 150 mg  150 mg Oral Daily Kyle, Tyrone A, DO   150 mg at 01/08/22 4818   lithium carbonate capsule 300 mg  300 mg Oral QHS Nicholas Griffins, MD   300 mg at 01/07/22 2150   ondansetron (ZOFRAN) injection 4 mg  4 mg Intravenous Q8H PRN Marylyn Ishihara, Tyrone A, DO       polyethylene glycol (MIRALAX / GLYCOLAX) packet 17 g  17 g Oral Daily Juanita Craver, MD   17 g at 01/08/22 0914   QUEtiapine (SEROQUEL) tablet 200 mg  200 mg Oral QHS Nicholas Griffins, MD       senna-docusate (Senokot-S) tablet 1 tablet  1 tablet Oral BID Marylyn Ishihara, Tyrone A, DO    1 tablet at 01/08/22 0914   sorbitol, milk of mag, mineral oil, glycerin (SMOG) enema  960 mL Rectal Once Nicholas Griffins, MD       Vitamin D (Ergocalciferol) (DRISDOL) 1.25 MG (50000 UNIT) capsule 50,000 Units  50,000 Units Oral Q7 days Nicholas Griffins, MD   50,000 Units at 01/04/22 1611   zolpidem (AMBIEN) tablet 10 mg  10 mg Oral QHS Kyle, Tyrone A, DO   10 mg at 01/07/22 2150     Discharge Medications: Please see discharge summary for a list of discharge medications.  Relevant Imaging Results:  Relevant Lab Results:   Additional Information SSN: [id number]; To sign over monthly SSI to facility  Nicholas Moselle, LCSW

## 2022-01-08 NOTE — Progress Notes (Addendum)
Physical Therapy Treatment Patient Details Name: Nicholas Oconnell MRN: [id number] DOB: 01-29-1961 Today's Date: 01/08/2022   History of Present Illness 61 y.o. male presenting from a group home with generalized weakness, poor PO intake and constipation and admitted 01/03/22 for Acute on chronic constipation, Ogilvie syndrome, abdominal distention. Past medical history significant of Ogilvie syndrome, HTN, CKD3a, RCC s/p partial right nephrectomy, HTN, schizoaffective disorder.    PT Comments    Patient requires 2 max assistance to sit up onto bed side. Patient  did stand at [facility] and take  shuffling steps to recliner, stood again at  [facility] with mod assistance. Patient demonstrates posterior lean in sitting and standing. Patient with noted chest congestion, encouraged patient to cough.  Continue PT for mobility.  Recommendations for follow up therapy are one component of a multi-disciplinary discharge planning process, led by the attending physician.  Recommendations may be updated based on patient status, additional functional criteria and insurance authorization.  Follow Up Recommendations  Skilled nursing-short term rehab (<3 hours/day) Can patient physically be transported by private vehicle: No   Assistance Recommended at Discharge Frequent or constant Supervision/Assistance  Patient can return home with the following Two people to help with walking and/or transfers;Two people to help with bathing/dressing/bathroom   Equipment Recommendations  None recommended by PT    Recommendations for Other Services       Precautions / Restrictions Precautions Precautions: Fall     Mobility  Bed Mobility   Bed Mobility: Supine to Sit     Supine to sit: Max assist, HOB elevated     General bed mobility comments: multimodal cues to initiate mobility, + 2 max assist with legs and trunk, posterior bias, use of bed pad  to scoot around.    Transfers   Equipment used: Rolling walker (2  wheels) Transfers: Sit to/from Stand, Bed to chair/wheelchair/BSC Sit to Stand: Max assist, +2 physical assistance, +2 safety/equipment   Step pivot transfers: Max assist, +2 physical assistance, +2 safety/equipment       General transfer comment: patient  did stand from bed at  [facility] with +2 max , posterior bias but able to take a few pivot steps to recliner. patient  Stood again at [employer] from [employer] with mod ASsistnace of 2, posterior bias continued.    Ambulation/Gait                   Stairs             Wheelchair Mobility    Modified Rankin (Stroke Patients Only)       Balance Overall balance assessment: Needs assistance Sitting-balance support: Bilateral upper extremity supported, Feet supported Sitting balance-Leahy Scale: Poor   Postural control: Posterior lean Standing balance support: Bilateral upper extremity supported, During functional activity, Reliant on assistive device for balance Standing balance-Leahy Scale: Zero                              Cognition Arousal/Alertness: Awake/alert Behavior During Therapy: WFL for tasks assessed/performed Overall Cognitive Status: History of cognitive impairments - at baseline                                 General Comments: states  that he falls        Exercises General Exercises - Lower Extremity Long Arc Quad: AROM, Both, 10 reps, Seated Hip Flexion/Marching: AROM, Both, 10 reps,  Seated    General Comments        Pertinent Vitals/Pain Pain Assessment Pain Assessment: No/denies pain    Home Living                          Prior Function            PT Goals (current goals can now be found in the care plan section) Progress towards PT goals: Progressing toward goals    Frequency    Min 2X/week      PT Plan Current plan remains appropriate    Co-evaluation              AM-PAC PT "6 Clicks" Mobility   Outcome Measure  Help needed  turning from your back to your side while in a flat bed without using bedrails?: A Lot Help needed moving from lying on your back to sitting on the side of a flat bed without using bedrails?: A Lot Help needed moving to and from a bed to a chair (including a wheelchair)?: A Lot Help needed standing up from a chair using your arms (e.g., wheelchair or bedside chair)?: Total Help needed to walk in hospital room?: Total Help needed climbing 3-5 steps with a railing? : Total 6 Click Score: 9    End of Session Equipment Utilized During Treatment: Gait belt Activity Tolerance: Patient tolerated treatment well Patient left: in chair;with call bell/phone within reach;with chair alarm set Nurse Communication: Mobility status PT Visit Diagnosis: Muscle weakness (generalized) (M62.81);Other abnormalities of gait and mobility (R26.89);History of falling (Z91.81)     Time: 1478-2956 PT Time Calculation (min) (ACUTE ONLY): 16 min  Charges:  $Therapeutic Activity: 8-22 mins                     Tresa Endo PT Acute Rehabilitation Services Office [phone] Weekend [phone]    Nicholas Oconnell 01/08/2022, 11:27 AM

## 2022-01-09 DIAGNOSIS — R140 Abdominal distension (gaseous): Secondary | ICD-10-CM | POA: Diagnosis not present

## 2022-01-09 LAB — BASIC METABOLIC PANEL
Anion gap: 10 (ref 5–15)
BUN: 23 mg/dL (ref 8–23)
CO2: 22 mmol/L (ref 22–32)
Calcium: 9.2 mg/dL (ref 8.9–10.3)
Chloride: 105 mmol/L (ref 98–111)
Creatinine, Ser: 1.47 mg/dL — ABNORMAL HIGH (ref 0.61–1.24)
GFR, Estimated: 54 mL/min — ABNORMAL LOW (ref >60)
Glucose, Bld: 108 mg/dL — ABNORMAL HIGH (ref 70–99)
Potassium: 5.2 mmol/L — ABNORMAL HIGH (ref 3.5–5.1)
Sodium: 137 mmol/L (ref 135–145)

## 2022-01-09 LAB — CBC
HCT: 42.7 % (ref 39.0–52.0)
Hemoglobin: 13.7 g/dL (ref 13.0–17.0)
MCH: 29.7 pg (ref 26.0–34.0)
MCHC: 32.1 g/dL (ref 30.0–36.0)
MCV: 92.6 fL (ref 80.0–100.0)
Platelets: 254 10*3/uL (ref 150–400)
RBC: 4.61 MIL/uL (ref 4.22–5.81)
RDW: 13.7 % (ref 11.5–15.5)
WBC: 7.5 10*3/uL (ref 4.0–10.5)
nRBC: 0 % (ref 0.0–0.2)

## 2022-01-09 LAB — LITHIUM LEVEL: Lithium Lvl: 0.79 mmol/L (ref 0.60–1.20)

## 2022-01-09 MED ORDER — HALOPERIDOL 2 MG PO TABS
2.0000 mg | ORAL_TABLET | Freq: Three times a day (TID) | ORAL | Status: DC
  Administered 2022-01-09 – 2022-01-16 (×21): 2 mg via ORAL
  Filled 2022-01-09 (×22): qty 1

## 2022-01-09 MED ORDER — SENNOSIDES-DOCUSATE SODIUM 8.6-50 MG PO TABS
2.0000 | ORAL_TABLET | Freq: Two times a day (BID) | ORAL | Status: DC
  Administered 2022-01-09 – 2022-01-16 (×14): 2 via ORAL
  Filled 2022-01-09 (×14): qty 2

## 2022-01-09 MED ORDER — QUETIAPINE FUMARATE 100 MG PO TABS
100.0000 mg | ORAL_TABLET | Freq: Every day | ORAL | Status: DC
  Administered 2022-01-09 – 2022-01-15 (×7): 100 mg via ORAL
  Filled 2022-01-09 (×7): qty 1

## 2022-01-09 MED ORDER — LACTULOSE 10 GM/15ML PO SOLN
30.0000 g | Freq: Three times a day (TID) | ORAL | Status: DC
  Administered 2022-01-09 – 2022-01-16 (×17): 30 g via ORAL
  Filled 2022-01-09 (×17): qty 45

## 2022-01-09 MED ORDER — LACTULOSE 10 GM/15ML PO SOLN
20.0000 g | Freq: Two times a day (BID) | ORAL | Status: DC
  Administered 2022-01-09: 20 g via ORAL
  Filled 2022-01-09: qty 30

## 2022-01-09 NOTE — Progress Notes (Signed)
PROGRESS NOTE  Nicholas Oconnell QZE:[id number] DOB: 12-07-1960 DOA: 01/03/2022 PCP: Neale Burly, MD   LOS: 5 days   Brief Narrative / Interim history:  61 y.o. male with medical history significant of Ogilvie syndrome, HTN, CKD3a, RCC s/p partial right nephrectomy, HTN, schizoaffective disorder. Presenting with generalized weakness, poor PO intake and constipation. History is from caregiver at bedside. He reports that the patient has 5 days of weakness and poor appetite. During this time, he has not had any fevers, urinary changes, or vomiting note.  Subjective / 24h Interval events: Feels well this morning.  No nausea or vomiting.  No abdominal pain  Assesement and Plan: Principal Problem:   Abdominal distention Active Problems:   CKD (chronic kidney disease), stage III (HCC)   Schizoaffective disorder, bipolar type (HCC)   HTN (hypertension)   Hypocalcemia   Elevated alkaline phosphatase level   Generalized weakness   History of tremor   Constipation   Ogilvie syndrome   Principal problem Acute on chronic constipation, Ogilvie syndrome, abdominal distention-GI consulted and evaluated patient.  He received serial enemas with improvement in his abdominal distention and pain.  He was placed on oral bowel regimen, however did not appear to have a bowel movement with orals alone.  Had a repeat enema yesterday and has not had any bowel movement since.  Add lactulose today.  Abdomen is soft, no nausea.  Active problems Generalized weakness -patient lives in a group home and he is on high-dose psychiatric medications, which probably contributed to his weakness.  Apparently, he gets his psychiatric medications adjusted frequently at the group home.  When he gets weak and drowsy they could cut down, and when he becomes agitated or not sleeping the doses are increased again.  Seroquel and Haldol decreased on 8/6 due to severe lethargy and he seems to be improved now.  Weakness is  persisting as he has been in the hospital for almost a week.  PT recommends SNF, he appears too weak to return to group home.  Social worker involved  [phone] -his baseline creatinine is around 1.3-1.5, with most recent 1 2 months ago was 1.7.  Creatinine remains at baseline this morning   HTN -continue to monitor blood pressure trends   Hypocalcemia, vitamin D deficiency-replete vitamin D   Elevated alk phos -isolated, unclear significance   Schizoaffective disorder Cognitive impairment- continue home regimen as below.  Decrease Seroquel and Haldol due to oversedation   Hx of tremor - continue home regimen. There is a 11/09/21 note from family medicine that mentions evaluation with neurology. 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 It looks like no action has been taken on this. Will defer to his outpatient psyc and neuro for his regimen going forward  Scheduled Meds:  divalproex  1,000 mg Oral QHS   enoxaparin (LOVENOX) injection  40 mg Subcutaneous Q24H   feeding supplement  1 Container Oral TID BM   haloperidol  5 mg Oral TID   lactulose  20 g Oral BID   lithium carbonate  150 mg Oral Daily   lithium carbonate  300 mg Oral QHS   polyethylene glycol  17 g Oral Daily   QUEtiapine  200 mg Oral QHS   senna-docusate  1 tablet Oral BID   sorbitol, milk of mag, mineral oil, glycerin (SMOG) enema  960 mL Rectal Once   Vitamin D (Ergocalciferol)  50,000 Units Oral Q7 days   zolpidem  10 mg  Oral QHS   Continuous Infusions:   PRN Meds:.alum & mag hydroxide-simeth, hydrALAZINE, ondansetron (ZOFRAN) IV  Diet Orders (From admission, onward)     Start     Ordered   01/09/22 1043  DIET DYS 3 Room service appropriate? Yes; Fluid consistency: Thin  Diet effective now       Comments: SOFT diet foods.  Question Answer Comment  Room service appropriate? Yes   Fluid consistency: Thin      01/09/22 1044             DVT prophylaxis: enoxaparin (LOVENOX) injection 40 mg Start: 01/03/22 1800   Lab Results  Component Value Date   PLT 254 01/09/2022      Code Status: Full Code  Family Communication: no family at bedside   Status is: Inpatient  Remains inpatient appropriate because: Needs SNF   Level of care: Med-Surg  Consultants:  GI  Objective: Vitals:   01/07/22 2046 01/08/22 0425 01/08/22 1314 01/08/22 2131  BP: (!) 129/95 137/76 (!) 147/88 133/82  Pulse: 94 83 92 98  Resp: '20 17 18 17  ' Temp: 99.6 F (37.6 C) 99.2 F (37.3 C) 98.7 F (37.1 C) 99.6 F (37.6 C)  TempSrc:      SpO2: 92% 90% 92% 94%  Weight:      Height:        Intake/Output Summary (Last 24 hours) at 01/09/2022 1057 Last data filed at 01/09/2022 0900 Gross per 24 hour  Intake 580 ml  Output 1150 ml  Net -570 ml    Wt Readings from Last 3 Encounters:  01/03/22 70.3 kg  11/28/21 70.3 kg  09/07/21 83.9 kg    Examination: Constitutional: NAD Eyes: lids and conjunctivae normal, no scleral icterus ENMT: mmm Neck: normal, supple Respiratory: clear to auscultation bilaterally, no wheezing, no crackles. Normal respiratory effort.  Cardiovascular: Regular rate and rhythm, no murmurs / rubs / gallops. No LE edema. Abdomen: soft, no distention, no tenderness. Bowel sounds positive.  Skin: no rashes Neurologic: no focal deficits, equal strength  Data Reviewed: I have independently reviewed following labs and imaging studies   CBC Recent Labs  Lab 01/03/22 0928 01/04/22 0450 01/09/22 0449  WBC 4.6 5.8 7.5  HGB 15.1 13.1 13.7  HCT 47.6 40.4 42.7  PLT 211 243 254  MCV 94.8 94.0 92.6  MCH 30.1 30.5 29.7  MCHC 31.7 32.4 32.1  RDW 13.3 13.2 13.7  LYMPHSABS 1.1  --   --   MONOABS 0.3  --   --   EOSABS 0.1  --   --   BASOSABS 0.0  --   --      Recent Labs  Lab 01/03/22 0842 01/03/22 0928 01/03/22 2122 01/04/22 0450 01/09/22 0449  NA  --  139  --  138 137  K  --  4.6  --  4.6 5.2*  CL   --  111  --  111 105  CO2  --  21*  --  21* 22  GLUCOSE  --  88  --  87 108*  BUN  --  21  --  16 23  CREATININE  --  1.64*  --  1.53* 1.47*  CALCIUM  --  7.9* 9.5 8.7* 9.2  AST  --  20  --  17  --   ALT  --  14  --  13  --   ALKPHOS  --  170*  --  139*  --   BILITOT  --  0.5  --  0.4  --   ALBUMIN  --  3.8  --  3.1*  --   MG  --   --  2.5*  --   --   AMMONIA 26  --   --   --   --      ------------------------------------------------------------------------------------------------------------------ No results for input(s): "CHOL", "HDL", "LDLCALC", "TRIG", "CHOLHDL", "LDLDIRECT" in the last 72 hours.  No results found for: "HGBA1C" ------------------------------------------------------------------------------------------------------------------ No results for input(s): "TSH", "T4TOTAL", "T3FREE", "THYROIDAB" in the last 72 hours.  Invalid input(s): "FREET3"  Cardiac Enzymes No results for input(s): "CKMB", "TROPONINI", "MYOGLOBIN" in the last 168 hours.  Invalid input(s): "CK" ------------------------------------------------------------------------------------------------------------------ No results found for: "BNP"  CBG: Recent Labs  Lab 01/08/22 1219  GLUCAP 57*    No results found for this or any previous visit (from the past 240 hour(s)).   Radiology Studies: No results found.   Marzetta Board, MD, PhD [facility]  Between 7 am - 7 pm I am available, please contact me via Amion (for emergencies) or Securechat (non urgent messages)  Between 7 pm - 7 am I am not available, please contact night coverage MD/APP via Amion

## 2022-01-09 NOTE — TOC Progression Note (Signed)
Transition of Care [facility]) - Progression Note    Patient Details  Name: Nicholas Oconnell MRN: [id number] Date of Birth: [birth year]  Transition of Care [facility]) CM/SW [place], [place] Phone Number: 01/09/2022, 12:56 PM  Clinical Narrative:    Pt currently has no bed offers for SNF placement. CSW has attempted to contact [facility] and [place] for placement at their facilities and still waiting to hear back.    Expected Discharge Plan: Group Home Barriers to Discharge: Continued Medical Work up  Expected Discharge Plan and Services Expected Discharge Plan: Group Home In-house Referral: Clinical Social Work Discharge Planning Services: CM Consult Post Acute Care Choice: [place] arrangements for the past 2 months: Group Home Expected Discharge Date: 01/07/22               DME Arranged: N/A DME Agency: NA                   Social Determinants of Health (SDOH) Interventions    Readmission Risk Interventions    01/08/2022   10:05 AM  Readmission Risk Prevention Plan  Transportation Screening Complete  PCP or Specialist Appt within 5-7 Days Complete  Home Care Screening Complete  Medication Review (RN CM) Complete

## 2022-01-10 ENCOUNTER — Inpatient Hospital Stay (HOSPITAL_COMMUNITY): Payer: Medicaid Other

## 2022-01-10 DIAGNOSIS — K5900 Constipation, unspecified: Secondary | ICD-10-CM | POA: Diagnosis not present

## 2022-01-10 DIAGNOSIS — K5981 Ogilvie syndrome: Secondary | ICD-10-CM

## 2022-01-10 DIAGNOSIS — N183 Chronic kidney disease, stage 3 unspecified: Secondary | ICD-10-CM

## 2022-01-10 DIAGNOSIS — R531 Weakness: Secondary | ICD-10-CM | POA: Diagnosis not present

## 2022-01-10 DIAGNOSIS — F250 Schizoaffective disorder, bipolar type: Secondary | ICD-10-CM

## 2022-01-10 LAB — BASIC METABOLIC PANEL
Anion gap: 11 (ref 5–15)
BUN: 27 mg/dL — ABNORMAL HIGH (ref 8–23)
CO2: 24 mmol/L (ref 22–32)
Calcium: 9.5 mg/dL (ref 8.9–10.3)
Chloride: 105 mmol/L (ref 98–111)
Creatinine, Ser: 1.43 mg/dL — ABNORMAL HIGH (ref 0.61–1.24)
GFR, Estimated: 55 mL/min — ABNORMAL LOW (ref >60)
Glucose, Bld: 100 mg/dL — ABNORMAL HIGH (ref 70–99)
Potassium: 5 mmol/L (ref 3.5–5.1)
Sodium: 140 mmol/L (ref 135–145)

## 2022-01-10 LAB — CBC
HCT: 46.2 % (ref 39.0–52.0)
Hemoglobin: 14.6 g/dL (ref 13.0–17.0)
MCH: 29.8 pg (ref 26.0–34.0)
MCHC: 31.6 g/dL (ref 30.0–36.0)
MCV: 94.3 fL (ref 80.0–100.0)
Platelets: 295 10*3/uL (ref 150–400)
RBC: 4.9 MIL/uL (ref 4.22–5.81)
RDW: 13.5 % (ref 11.5–15.5)
WBC: 8.7 10*3/uL (ref 4.0–10.5)
nRBC: 0 % (ref 0.0–0.2)

## 2022-01-10 MED ORDER — SORBITOL 70 % SOLN
960.0000 mL | TOPICAL_OIL | Freq: Once | ORAL | Status: AC
  Administered 2022-01-10: 960 mL via RECTAL
  Filled 2022-01-10: qty 473

## 2022-01-10 NOTE — Progress Notes (Signed)
Physical Therapy Treatment Patient Details Name: Glynn Yepes MRN: [id number] DOB: Nov 05, 1960 Today's Date: 01/10/2022   History of Present Illness 61 y.o. male presenting from a group home with generalized weakness, poor PO intake and constipation and admitted 01/03/22 for Acute on chronic constipation, Ogilvie syndrome, abdominal distention. Past medical history significant of Ogilvie syndrome, HTN, CKD3a, RCC s/p partial right nephrectomy, HTN, schizoaffective disorder.    PT Comments    General Comments: AxO x 2 aware he is not "home" (Group Home), following all commands 25% repeat to complete task at hand, Pleasant.  Cooperative.  Engaging. Pt progressing well.  Assisted OOB to amb to bathroom and in hallway. General bed mobility comments: rigid throughout, slow but was able to get to EOB with increased time and Min Asisst with upper body.  Pt reaching out with both hands for assistance.  Initial posterior push.  Poor flexed posture (trunk, hips, knees) General transfer comment: started with 2  hand held assist with 25% VC's for direction.  Also asissted with a toilet transfer.  75% VC's for turn completion/targeting as pt sits too early but then corrects to center after. General Gait Details: started with 2 hand held assist then progressed to 1 hand held assist and hallway rail on opposite side.  Pt was able to amb a functional distance to bathroom then in hallway a total of 55 feet NO AD.  Posture is poor, flexed hips/knees/trunk but present with good safe alternative stepping/shuffle.  Able to support his own body weight and able to achieve fair balance.  Still a FALL RISK.  Would NOT rec he amb on his own just yet. Will consult LPT.  Pt appears to be at/close to prior level of mobility and would be best he return to his familiar [place] setting.   Recommendations for follow up therapy are one component of a multi-disciplinary discharge planning process, led by the attending physician.   Recommendations may be updated based on patient status, additional functional criteria and insurance authorization.  Follow Up Recommendations  Home health PT (@ Group Home) Can patient physically be transported by private vehicle: No   Assistance Recommended at Discharge Frequent or constant Supervision/Assistance  Patient can return home with the following A little help with walking and/or transfers   Equipment Recommendations  None recommended by PT    Recommendations for Other Services       Precautions / Restrictions Precautions Precautions: Fall Restrictions Weight Bearing Restrictions: No     Mobility  Bed Mobility Overal bed mobility: Needs Assistance Bed Mobility: Supine to Sit     Supine to sit: Min assist     General bed mobility comments: rigid throughout, slow but was able to get to EOB with increased time and Min Asisst with upper body.  Pt reaching out with both hands for assistance.  Initial posterior push.  Poor flexed posture (trunk, hips, knees)    Transfers Overall transfer level: Needs assistance Equipment used: None Transfers: Sit to/from Stand Sit to Stand: Min assist, Mod assist   Step pivot transfers: Mod assist       General transfer comment: started with 2  hand held assist with 25% VC's for direction.  Also asissted with a toilet transfer.  75% VC's for turn completion/targeting as pt sits too early but then corrects to center after.    Ambulation/Gait Ambulation/Gait assistance: Min assist Gait Distance (Feet): 55 Feet Assistive device: None Gait Pattern/deviations: Step-to pattern, Trunk flexed, Narrow base of support, Ataxic, Knee  flexed in stance - right, Knee flexed in stance - left Gait velocity: decreased     General Gait Details: started with 2 hand held assist then progressed to 1 hand held assist and hallway rail on opposite side.  Pt was able to amb a functional distance to bathroom then in hallway a total of 55 feet NO AD.   Posture is poor, flexed hips/knees/trunk but present with good safe alternative stepping/shuffle.  Able to support his own body weight and able to achieve fair balance.  Still a FALL RISK.  Would NOT rec he amb on his own just yet.   Stairs             Wheelchair Mobility    Modified Rankin (Stroke Patients Only)       Balance                                            Cognition Arousal/Alertness: Awake/alert Behavior During Therapy: WFL for tasks assessed/performed Overall Cognitive Status: History of cognitive impairments - at baseline                                 General Comments: AxO x 2 aware he is not "home" (Group Home), following all commands 25% repeat to complete task at hand, Pleasant.  Cooperative.  Engaging.        Exercises      General Comments        Pertinent Vitals/Pain Pain Assessment Pain Assessment: No/denies pain    Home Living                          Prior Function            PT Goals (current goals can now be found in the care plan section)      Frequency    Min 2X/week      PT Plan Discharge plan needs to be updated    Co-evaluation              AM-PAC PT "6 Clicks" Mobility   Outcome Measure  Help needed turning from your back to your side while in a flat bed without using bedrails?: A Little Help needed moving from lying on your back to sitting on the side of a flat bed without using bedrails?: A Little Help needed moving to and from a bed to a chair (including a wheelchair)?: A Little Help needed standing up from a chair using your arms (e.g., wheelchair or bedside chair)?: A Little Help needed to walk in hospital room?: A Little Help needed climbing 3-5 steps with a railing? : A Lot 6 Click Score: 17    End of Session Equipment Utilized During Treatment: Gait belt Activity Tolerance: Patient tolerated treatment well Patient left: in chair;with call  bell/phone within reach;with chair alarm set Nurse Communication: Mobility status PT Visit Diagnosis: Muscle weakness (generalized) (M62.81);Other abnormalities of gait and mobility (R26.89);History of falling (Z91.81)     Time: 5366-4403 PT Time Calculation (min) (ACUTE ONLY): 28 min  Charges:  $Gait Training: 8-22 mins $Therapeutic Activity: 8-22 mins                     Rica Koyanagi  PTA Acute  Rehabilitation Services Office M-F  [phone] Weekend pager [phone]

## 2022-01-10 NOTE — Progress Notes (Signed)
PROGRESS NOTE    Nicholas Oconnell  NWG:[id number] DOB: [birth year] DOA: 01/03/2022 PCP: Nicholas Burly, MD   Brief Narrative:   61 y.o. male with medical history significant of Ogilvie syndrome, HTN, CKD3a, RCC s/p partial right nephrectomy, HTN, schizoaffective disorder presented with generalized weakness, poor PO intake and constipation.  He was admitted with acute on chronic constipation/Ogilvie syndrome.  GI was consulted.  Patient received serial enemas with improvement in abdominal distention.  GI subsequently signed off.  PT recommended SNF placement.  Currently medically stable for SNF.  Assessment & Plan:   Acute on chronic constipation/Ogilvie syndrome causing abdominal distention -GI evaluated and subsequently signed off.  Received serial enemas with improvement in his abdominal distention and pain.  He was placed on oral bowel regimen, however did not appear to have a bowel movement with orals alone.  Still requiring intermittent enema. -Continue current bowel regimen along with lactulose  Generalized weakness -patient lives in a group home and he is on high-dose psychiatric medications, which probably contributed to his weakness.  Apparently, he gets his psychiatric medications adjusted frequently at the group home.  When he gets weak and drowsy they cut down, and when he becomes agitated or not sleeping the doses are increased again.  Seroquel and Haldol decreased on 8/6 due to severe lethargy and he seems to be improved now.    Schizoaffective disorder/cognitive impairment -Consult psychiatry to see if further dose changes are needed.  Currently also on lithium.  CKD stage IIIa -Baseline creatinine 1.3-1.5.  Creatinine currently stable  Hypertension -Blood pressure intermittently on the higher side.  Monitor.  Hypocalcemia/vitamin D deficiency -Continue replacement  History of tremor -There is a 11/09/21 note from family medicine that mentions evaluation with neurology.  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 It looks like no action has been taken on this. -Psychiatry evaluation. -Neuro evaluation as an outpatient.  Physical deconditioning -Will ask PT to reevaluate.  Social worker following for placement.    DVT prophylaxis: Lovenox Code Status: Full Family Communication: None at bedside Disposition Plan: Status is: Inpatient Remains inpatient appropriate because: Of need for SNF placement    Consultants: GI  Procedures: None  Antimicrobials: None   Subjective: Patient seen and examined at bedside.  Poor historian.  No overnight fever, seizures, agitation reported.  Objective: Vitals:   01/09/22 1224 01/09/22 1623 01/09/22 1931 01/10/22 0617  BP: (!) 135/91 139/85 122/76 (!) 139/92  Pulse: 80 77 86 100  Resp: '20 20 16 16  '$ Temp: 98.4 F (36.9 C) (!) 97.5 F (36.4 C) 98.1 F (36.7 C) 98.6 F (37 C)  TempSrc: Oral Oral Oral Oral  SpO2: 99% 96% 98% 100%  Weight:      Height:        Intake/Output Summary (Last 24 hours) at 01/10/2022 1137 Last data filed at 01/10/2022 0930 Gross per 24 hour  Intake 840 ml  Output 2400 ml  Net -1560 ml   Filed Weights   01/03/22 2214  Weight: 70.3 kg    Examination:  General exam: Appears calm and comfortable.  Currently on room air.  Poor historian.  Extremely slow to respond.  Flat affect. Respiratory system: Bilateral decreased breath sounds at bases Cardiovascular system: S1 & S2 heard, Rate controlled Gastrointestinal system: Abdomen is nondistended, soft and nontender. Normal bowel sounds heard. Extremities: No cyanosis, clubbing, edema   Data Reviewed: I have personally reviewed following labs and imaging studies  CBC: Recent  Labs  Lab 01/04/22 0450 01/09/22 0449 01/10/22 0503  WBC 5.8 7.5 8.7  HGB 13.1 13.7 14.6  HCT 40.4 42.7 46.2  MCV 94.0 92.6 94.3  PLT 243 254 938   Basic Metabolic  Panel: Recent Labs  Lab 01/03/22 2122 01/04/22 0450 01/09/22 0449 01/10/22 0503  NA  --  138 137 140  K  --  4.6 5.2* 5.0  CL  --  111 105 105  CO2  --  21* 22 24  GLUCOSE  --  87 108* 100*  BUN  --  16 23 27*  CREATININE  --  1.53* 1.47* 1.43*  CALCIUM 9.5 8.7* 9.2 9.5  MG 2.5*  --   --   --    GFR: Estimated Creatinine Clearance: 51.8 mL/min (A) (by C-G formula based on SCr of 1.43 mg/dL (H)). Liver Function Tests: Recent Labs  Lab 01/04/22 0450  AST 17  ALT 13  ALKPHOS 139*  BILITOT 0.4  PROT 6.4*  ALBUMIN 3.1*   No results for input(s): "LIPASE", "AMYLASE" in the last 168 hours. No results for input(s): "AMMONIA" in the last 168 hours. Coagulation Profile: No results for input(s): "INR", "PROTIME" in the last 168 hours. Cardiac Enzymes: No results for input(s): "CKTOTAL", "CKMB", "CKMBINDEX", "TROPONINI" in the last 168 hours. BNP (last 3 results) No results for input(s): "PROBNP" in the last 8760 hours. HbA1C: No results for input(s): "HGBA1C" in the last 72 hours. CBG: Recent Labs  Lab 01/08/22 1219  GLUCAP 57*   Lipid Profile: No results for input(s): "CHOL", "HDL", "LDLCALC", "TRIG", "CHOLHDL", "LDLDIRECT" in the last 72 hours. Thyroid Function Tests: No results for input(s): "TSH", "T4TOTAL", "FREET4", "T3FREE", "THYROIDAB" in the last 72 hours. Anemia Panel: No results for input(s): "VITAMINB12", "FOLATE", "FERRITIN", "TIBC", "IRON", "RETICCTPCT" in the last 72 hours. Sepsis Labs: No results for input(s): "PROCALCITON", "LATICACIDVEN" in the last 168 hours.  No results found for this or any previous visit (from the past 240 hour(s)).       Radiology Studies: No results found.      Scheduled Meds:  divalproex  1,000 mg Oral QHS   enoxaparin (LOVENOX) injection  40 mg Subcutaneous Q24H   feeding supplement  1 Container Oral TID BM   haloperidol  2 mg Oral TID   lactulose  30 g Oral TID   lithium carbonate  150 mg Oral Daily   lithium  carbonate  300 mg Oral QHS   polyethylene glycol  17 g Oral Daily   QUEtiapine  100 mg Oral QHS   senna-docusate  2 tablet Oral BID   sorbitol, milk of mag, mineral oil, glycerin (SMOG) enema  960 mL Rectal Once   Vitamin D (Ergocalciferol)  50,000 Units Oral Q7 days   zolpidem  10 mg Oral QHS   Continuous Infusions:        Aline August, MD [facility] 01/10/2022, 11:37 AM

## 2022-01-10 NOTE — TOC Progression Note (Addendum)
Transition of Care [facility]) - Progression Note    Patient Details  Name: Nicholas Oconnell MRN: [id number] Date of Birth: 20-Aug-1960  Transition of Care [facility]) CM/SW [place], [place] Phone Number: 01/10/2022, 11:21 AM  Clinical Narrative:    Pt continues to have no bed offers for SNF placement and has been denied by multiple facilities. CSW has attempted to contact [facility] and [place] via text and phone calls with no answer and no return calls.  Pt's PASRR still at level II review.   Reassessing pt's ableness to return to group home with home health services and appropriate DME may need to be explored.    Update 1325: Pt was able to ambulate with PT today and is recommended to return to group home with [facility] services. CSW left voicemail w/ pt's sister/legal guardian to discuss current recommendations. CSW spoke with Danford Bad, RN at group home and reviewed pt's progress and current recommendations. Group home is able to accept pt back once medically stable. Group home will need printed copies of [place] orders and will need medication orders placed for ongoing bowel regimen issues.   Expected Discharge Plan: Group Home Barriers to Discharge: Continued Medical Work up  Expected Discharge Plan and Services Expected Discharge Plan: Group Home In-house Referral: Clinical Social Work Discharge Planning Services: CM Consult Post Acute Care Choice: [place] arrangements for the past 2 months: Group Home Expected Discharge Date: 01/07/22               DME Arranged: N/A DME Agency: NA                   Social Determinants of Health (SDOH) Interventions    Readmission Risk Interventions    01/08/2022   10:05 AM  Readmission Risk Prevention Plan  Transportation Screening Complete  PCP or Specialist Appt within 5-7 Days Complete  Home Care Screening Complete  Medication Review (RN CM) Complete

## 2022-01-11 ENCOUNTER — Inpatient Hospital Stay (HOSPITAL_COMMUNITY): Payer: Medicaid Other

## 2022-01-11 DIAGNOSIS — K5900 Constipation, unspecified: Secondary | ICD-10-CM | POA: Diagnosis not present

## 2022-01-11 DIAGNOSIS — Z8669 Personal history of other diseases of the nervous system and sense organs: Secondary | ICD-10-CM

## 2022-01-11 DIAGNOSIS — R531 Weakness: Secondary | ICD-10-CM | POA: Diagnosis not present

## 2022-01-11 DIAGNOSIS — R140 Abdominal distension (gaseous): Secondary | ICD-10-CM | POA: Diagnosis not present

## 2022-01-11 DIAGNOSIS — K5981 Ogilvie syndrome: Secondary | ICD-10-CM | POA: Diagnosis not present

## 2022-01-11 LAB — VALPROIC ACID LEVEL: Valproic Acid Lvl: 38 ug/mL — ABNORMAL LOW (ref 50.0–100.0)

## 2022-01-11 MED ORDER — VALBENAZINE TOSYLATE 40 MG PO CAPS
40.0000 mg | ORAL_CAPSULE | Freq: Every day | ORAL | Status: DC
  Administered 2022-01-12 – 2022-01-16 (×5): 40 mg via ORAL
  Filled 2022-01-11 (×5): qty 1

## 2022-01-11 MED ORDER — POLYETHYLENE GLYCOL 3350 17 G PO PACK
17.0000 g | PACK | Freq: Two times a day (BID) | ORAL | Status: DC
  Administered 2022-01-11 – 2022-01-16 (×10): 17 g via ORAL
  Filled 2022-01-11 (×10): qty 1

## 2022-01-11 MED ORDER — SORBITOL 70 % SOLN
960.0000 mL | TOPICAL_OIL | Freq: Once | ORAL | Status: DC
  Filled 2022-01-11: qty 473

## 2022-01-11 NOTE — Progress Notes (Signed)
PROGRESS NOTE    Nicholas Oconnell  LZJ:[id number] DOB: 1960-06-18 DOA: 01/03/2022 PCP: Neale Burly, MD   Brief Narrative:   61 y.o. male with medical history significant of Ogilvie syndrome, HTN, CKD3a, RCC s/p partial right nephrectomy, HTN, schizoaffective disorder presented with generalized weakness, poor PO intake and constipation.  He was admitted with acute on chronic constipation/Ogilvie syndrome.  GI was consulted.  Patient received serial enemas with improvement in abdominal distention.  GI subsequently signed off.  PT recommended SNF placement.  Currently medically stable for SNF.  Assessment & Plan:   Acute on chronic constipation/Ogilvie syndrome causing abdominal distention -GI evaluated and subsequently signed off.  Received serial enemas with improvement in his abdominal distention and pain.  He was placed on oral bowel regimen, however did not appear to have a bowel movement with orals alone.  Still requiring intermittent enema. -Continue current bowel regimen along with lactulose -Patient had vomiting on 01/10/2022 and abdominal x-ray showed colonic ileus.  He was given another dose of enema with improved abdominal distention.  Again had vomiting earlier this morning and abdominal x-ray still showing ileus.  Will give another dose of enema today.  Increase MiraLAX to twice a day.  Generalized weakness -patient lives in a group home and he is on high-dose psychiatric medications, which probably contributed to his weakness.  Apparently, he gets his psychiatric medications adjusted frequently at the group home.  When he gets weak and drowsy they cut down, and when he becomes agitated or not sleeping the doses are increased again.  Seroquel and Haldol decreased on 8/6 due to severe lethargy and he seems to be improved now.    Schizoaffective disorder/cognitive impairment -Psychiatry consultation pending.  Currently also on lithium.  CKD stage IIIa -Baseline creatinine 1.3-1.5.   Creatinine currently stable  Hypertension -Blood pressure intermittently on the higher side.  Monitor.  Hypocalcemia/vitamin D deficiency -Continue replacement  History of tremor -There is a 11/09/21 note from family medicine that mentions evaluation with neurology. 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 It looks like no action has been taken on this. -Psychiatry evaluation. -Neuro evaluation as an outpatient.  Physical deconditioning -PT now recommends home health PT at group home.  Social worker following   DVT prophylaxis: Lovenox Code Status: Full Family Communication: None at bedside Disposition Plan: Status is: Inpatient Remains inpatient appropriate because: In 1 to 2 days if clinically improves and vomiting settles down.   Consultants: GI  Procedures: None  Antimicrobials: None   Subjective: Patient seen and examined at bedside.  Poor historian.  No agitation, seizures or fever reported.  Nursing staff reported that patient vomited again this morning. Objective: Vitals:   01/09/22 1931 01/10/22 0617 01/10/22 1326 01/10/22 1923  BP: 122/76 (!) 139/92 (!) 144/94 (!) 153/84  Pulse: 86 100 95 93  Resp: '16 16 14 18  '$ Temp: 98.1 F (36.7 C) 98.6 F (37 C) 98.3 F (36.8 C) 99.3 F (37.4 C)  TempSrc: Oral Oral Axillary Oral  SpO2: 98% 100% 96% 95%  Weight:      Height:        Intake/Output Summary (Last 24 hours) at 01/11/2022 1051 Last data filed at 01/11/2022 0849 Gross per 24 hour  Intake 360 ml  Output --  Net 360 ml    Filed Weights   01/03/22 2214  Weight: 70.3 kg    Examination:  General exam: No distress.  Still on room air.  Chronically  ill and deconditioned.  Poor historian.  Extremely slow to respond.  Flat affect. Respiratory system: Decreased breath sounds at bases bilaterally, no wheezing cardiovascular system: S1 & S2 heard, Rate controlled  currently Gastrointestinal system: Abdomen is distended, soft and nontender.  Bowel sounds heard.   Extremities: Mild lower extremity edema present.  No clubbing  Data Reviewed: I have personally reviewed following labs and imaging studies  CBC: Recent Labs  Lab 01/09/22 0449 01/10/22 0503  WBC 7.5 8.7  HGB 13.7 14.6  HCT 42.7 46.2  MCV 92.6 94.3  PLT 254 242    Basic Metabolic Panel: Recent Labs  Lab 01/09/22 0449 01/10/22 0503  NA 137 140  K 5.2* 5.0  CL 105 105  CO2 22 24  GLUCOSE 108* 100*  BUN 23 27*  CREATININE 1.47* 1.43*  CALCIUM 9.2 9.5    GFR: Estimated Creatinine Clearance: 51.8 mL/min (A) (by C-G formula based on SCr of 1.43 mg/dL (H)). Liver Function Tests: No results for input(s): "AST", "ALT", "ALKPHOS", "BILITOT", "PROT", "ALBUMIN" in the last 168 hours.  No results for input(s): "LIPASE", "AMYLASE" in the last 168 hours. No results for input(s): "AMMONIA" in the last 168 hours. Coagulation Profile: No results for input(s): "INR", "PROTIME" in the last 168 hours. Cardiac Enzymes: No results for input(s): "CKTOTAL", "CKMB", "CKMBINDEX", "TROPONINI" in the last 168 hours. BNP (last 3 results) No results for input(s): "PROBNP" in the last 8760 hours. HbA1C: No results for input(s): "HGBA1C" in the last 72 hours. CBG: Recent Labs  Lab 01/08/22 1219  GLUCAP 57*    Lipid Profile: No results for input(s): "CHOL", "HDL", "LDLCALC", "TRIG", "CHOLHDL", "LDLDIRECT" in the last 72 hours. Thyroid Function Tests: No results for input(s): "TSH", "T4TOTAL", "FREET4", "T3FREE", "THYROIDAB" in the last 72 hours. Anemia Panel: No results for input(s): "VITAMINB12", "FOLATE", "FERRITIN", "TIBC", "IRON", "RETICCTPCT" in the last 72 hours. Sepsis Labs: No results for input(s): "PROCALCITON", "LATICACIDVEN" in the last 168 hours.  No results found for this or any previous visit (from the past 240 hour(s)).       Radiology Studies: DG Abd 1  View  Result Date: 01/11/2022 CLINICAL DATA:  Intractable nausea and vomiting. EXAM: ABDOMEN - 1 VIEW COMPARISON:  Abdominal x-ray from yesterday. FINDINGS: Diffuse colonic dilatation is not significantly changed. Multiple surgical clips in the left abdomen again noted. No acute osseous abnormality. IMPRESSION: 1. Unchanged colonic ileus. Electronically Signed   By: Titus Dubin M.D.   On: 01/11/2022 08:46   DG Abd 1 View  Result Date: 01/10/2022 CLINICAL DATA:  Vomiting.  History of bowel obstruction. EXAM: ABDOMEN - 1 VIEW COMPARISON:  Abdomen and pelvis CT dated 01/03/2022 FINDINGS: Dilated right and transverse colon without significant change. Decreased caliber of the rectosigmoid colon. Significantly less stool. Postsurgical changes on the left with surgical clips. Unremarkable bones. IMPRESSION: Similar colonic ileus with a marked reduction in amount of stool. Electronically Signed   By: Claudie Revering M.D.   On: 01/10/2022 13:04        Scheduled Meds:  divalproex  1,000 mg Oral QHS   enoxaparin (LOVENOX) injection  40 mg Subcutaneous Q24H   feeding supplement  1 Container Oral TID BM   haloperidol  2 mg Oral TID   lactulose  30 g Oral TID   lithium carbonate  150 mg Oral Daily   lithium carbonate  300 mg Oral QHS   polyethylene glycol  17 g Oral Daily   QUEtiapine  100 mg Oral QHS  senna-docusate  2 tablet Oral BID   Vitamin D (Ergocalciferol)  50,000 Units Oral Q7 days   zolpidem  10 mg Oral QHS   Continuous Infusions:        Aline August, MD [facility] 01/11/2022, 10:51 AM

## 2022-01-11 NOTE — Consult Note (Signed)
[facility] Consult   Reason for Consult:  Patient on multiple meds; doses recently reduced. ? Contributing to weakness/tremors.  Referring Physician:  Dr. Starla Link Patient Identification: Nicholas Oconnell MRN:  [id number] Principal Diagnosis: Abdominal distention Diagnosis:  Principal Problem:   Abdominal distention Active Problems:   CKD (chronic kidney disease), stage III (HCC)   Schizoaffective disorder, bipolar type (HCC)   HTN (hypertension)   Hypocalcemia   Elevated alkaline phosphatase level   Generalized weakness   History of tremor   Constipation   Ogilvie syndrome   Total Time spent with patient: 45 minutes  Subjective:   Nicholas Oconnell is a 61 y.o. male patient admitted from a group home with generalized weakness, poor PO intake and constipation and admitted 01/03/22 for Acute on chronic constipation, Ogilvie syndrome, abdominal distention. Past medical history significant of Ogilvie syndrome, HTN, CKD3a, RCC s/p partial right nephrectomy, HTN, schizoaffective disorder .  Nicholas Oconnell is a 61 year old male with history of bipolar affective disorder, schizoaffective disorder, borderline intellectual functioning, drug induced parkinsonian and tremors.  Patient admits to longstanding/chronic history of psychotropic medication, for management of schizoaffective disorder.  He currently resides in a group home, reports compliance with outpatient psych and neurology.  Psych consult was placed for tremors?  Weakness, medications reduce knee recommendations.  Patient is on multiple medication for his mood and behavioral concerns to include Depakote, quetiapine, Haldol, and lithium.  The above medications have been adjusted during this hospitalization due to concerns noted above.  Patient does have a legal guardian in place.  On exam today he is noted to be at baseline, engages well.  He is able to answer most questions appropriately, and has linear yet concrete thought processes.   He reports compliance with medication during his hospitalization.  He reports he is eating and sleeping well.  When questioned his oral intake, patient reports some concerns about his tremors and unsteadiness of hands.  He does request assistance with opening of his milk and placing his drawl inside milk container, and politely thanks this provider for assistance.  He denies any changes in his mood, and or concerns due to medication. .  He has been on his currently stable, on current medications.  He has had no behavioral disturbances while on his current medication, due to optimization of current psychotropic medications.  He denies any hallucinations, psychosis, paranoia during this evaluation.  He does not appear to be responding to internal stimuli, external stimuli, and on exhibiting any delusional thought disorder.  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  He denies any suicidal ideations, homicidal ideations, and or auditory or visual hallucinations   HPI:   61 y.o. male with medical history significant of Ogilvie syndrome, HTN, CKD3a, RCC s/p partial right nephrectomy, HTN, schizoaffective disorder. Presenting with generalized weakness, poor PO intake and constipation. History is from caregiver at bedside. He reports that the patient has 5 days of weakness and poor appetite. During this time, he has not had any fevers, urinary changes, or vomiting note.  Past Psychiatric History: Schizoaffective disorder, and borderline intellectual disorder.  Current medications:Lithium, Depakote, Seroquel, Haldol Previous psychiatric medications:, Propranolol, Ambien, Clozaril, paliperidone Patient is being managed by her outpatient psychiatrist Dr. Helene Kelp, and Dr. Jetta Lout at [facility] for drug-induced parkinsonianism and tremors.   Risk to Self:  Denies Risk to Others:  Denies Prior Inpatient Therapy:   Denies Prior Outpatient Therapy:  Denies  Past Medical  History:  Past Medical History:  Diagnosis Date   Anxiety    Bipolar affective ([place])    Cancer ([place])    Chronic kidney disease (CKD)    Cognitive deficits    Hypertension    Seizure ([place])    History reviewed. No pertinent surgical history. Family History: History reviewed. No pertinent family history. Family Psychiatric  History: Denies per sister/LG Social History:  Social History   Substance and Sexual Activity  Alcohol Use Never     Social History   Substance and Sexual Activity  Drug Use Never    Social History   Socioeconomic History   Marital status: Single    Spouse name: Not on file   Number of children: Not on file   Years of education: Not on file   Highest education level: Not on file  Occupational History   Not on file  Tobacco Use   Smoking status: Never   Smokeless tobacco: Never  Vaping Use   Vaping Use: Never used  Substance and Sexual Activity   Alcohol use: Never   Drug use: Never   Sexual activity: Not on file  Other Topics Concern   Not on file  Social History Narrative   Not on file   Social Determinants of Health   Financial Resource Strain: Not on file  Food Insecurity: Not on file  Transportation Needs: Not on file  Physical Activity: Not on file  Stress: Not on file  Social Connections: Not on file   Additional Social History: Specify valuables returned: clothes and shoes  Allergies:   Allergies  Allergen Reactions   Benzodiazepines Other (See Comments)    disinhibition Not on MAR   Clozapine Other (See Comments)    constipation - bowel obstruction Not on MAR    Labs:  Results for orders placed or performed during the hospital encounter of 01/03/22 (from the past 48 hour(s))  Lithium level     Status: None   Collection Time: 01/09/22  2:06 PM  Result Value Ref Range   Lithium Lvl 0.79 0.60 - 1.20 mmol/L    Comment: Performed at [facility]  [place] [address] [place], [place] [address]  Basic metabolic panel     Status: Abnormal   Collection Time: 01/10/22  5:03 AM  Result Value Ref Range   Sodium 140 135 - 145 mmol/L   Potassium 5.0 3.5 - 5.1 mmol/L   Chloride 105 98 - 111 mmol/L   CO2 24 22 - 32 mmol/L   Glucose, Bld 100 (H) 70 - 99 mg/dL    Comment: Glucose reference range applies only to samples taken after fasting for at least 8 hours.   BUN 27 (H) 8 - 23 mg/dL   Creatinine, Ser 1.43 (H) 0.61 - 1.24 mg/dL   Calcium 9.5 8.9 - 10.3 mg/dL   GFR, Estimated 55 (L) >60 mL/min    Comment: (NOTE) Calculated using the CKD-EPI Creatinine Equation (2021)    Anion gap 11 5 - 15    Comment: Performed at [facility], [place] [address] [place], [place] [address]  CBC     Status: None   Collection Time: 01/10/22  5:03 AM  Result Value Ref Range   WBC 8.7 4.0 - 10.5 K/uL   RBC 4.90 4.22 - 5.81 MIL/uL   Hemoglobin 14.6 13.0 - 17.0 g/dL   HCT 46.2 39.0 - 52.0 %   MCV 94.3 80.0 - 100.0 fL   MCH 29.8 26.0 -  34.0 pg   MCHC 31.6 30.0 - 36.0 g/dL   RDW 13.5 11.5 - 15.5 %   Platelets 295 150 - 400 K/uL   nRBC 0.0 0.0 - 0.2 %    Comment: Performed at [facility], [place] [address], [place], [place] [address]    Current Facility-Administered Medications  Medication Dose Route Frequency Provider Last Rate Last Admin   alum & mag hydroxide-simeth (MAALOX/MYLANTA) 200-200-20 MG/5ML suspension 30 mL  30 mL Oral Q6H PRN Caren Griffins, MD   30 mL at 01/07/22 1620   divalproex (DEPAKOTE ER) 24 hr tablet 1,000 mg  1,000 mg Oral QHS Kyle, Tyrone A, DO   1,000 mg at 01/10/22 2139   enoxaparin (LOVENOX) injection 40 mg  40 mg Subcutaneous Q24H Kyle, Tyrone A, DO   40 mg at 01/10/22 1745   feeding supplement (BOOST / RESOURCE BREEZE) liquid 1 Container  1 Container Oral TID BM Caren Griffins, MD   1 Container at 01/11/22 4628   haloperidol (HALDOL) tablet 2 mg  2 mg Oral TID Caren Griffins, MD   2 mg at  01/11/22 6381   hydrALAZINE (APRESOLINE) injection 10 mg  10 mg Intravenous Q8H PRN Marylyn Ishihara, Tyrone A, DO       lactulose (CHRONULAC) 10 GM/15ML solution 30 g  30 g Oral TID Caren Griffins, MD   30 g at 01/11/22 7711   lithium carbonate capsule 150 mg  150 mg Oral Daily Kyle, Tyrone A, DO   150 mg at 01/11/22 6579   lithium carbonate capsule 300 mg  300 mg Oral QHS Caren Griffins, MD   300 mg at 01/10/22 2140   ondansetron (ZOFRAN) injection 4 mg  4 mg Intravenous Q8H PRN Marylyn Ishihara, Tyrone A, DO   4 mg at 01/11/22 0383   polyethylene glycol (MIRALAX / GLYCOLAX) packet 17 g  17 g Oral BID Aline August, MD       QUEtiapine (SEROQUEL) tablet 100 mg  100 mg Oral QHS Caren Griffins, MD   100 mg at 01/10/22 2140   senna-docusate (Senokot-S) tablet 2 tablet  2 tablet Oral BID Caren Griffins, MD   2 tablet at 01/11/22 3383   sorbitol, milk of mag, mineral oil, glycerin (SMOG) enema  960 mL Rectal Once Aline August, MD       Vitamin D (Ergocalciferol) (DRISDOL) 1.25 MG (50000 UNIT) capsule 50,000 Units  50,000 Units Oral Q7 days Caren Griffins, MD   50,000 Units at 01/04/22 1611   zolpidem (AMBIEN) tablet 10 mg  10 mg Oral QHS Kyle, Tyrone A, DO   10 mg at 01/10/22 2140    Musculoskeletal: Strength & Muscle Tone: within normal limits Gait & Station: normal Patient leans: N/A    Psychiatric Specialty Exam:  Presentation  General Appearance: Casual  Eye Contact:Good  Speech:Clear and Coherent; Slow  Speech Volume:Normal  Handedness:Right   Mood and Affect  Mood:Euthymic  Affect:Appropriate   Thought Process  Thought Processes:Linear; Coherent  Descriptions of Associations:Intact  Orientation:Full (Time, Place and Person)  Thought Content:Logical  History of Schizophrenia/Schizoaffective disorder:No data recorded Duration of Psychotic Symptoms:No data recorded Hallucinations:Hallucinations: None  Ideas of Reference:No data recorded Suicidal Thoughts:Suicidal  Thoughts: No  Homicidal Thoughts:Homicidal Thoughts: No   Sensorium  Memory:No data recorded Judgment:No data recorded Insight:No data recorded  Executive Functions  Concentration:No data recorded Attention Span:No data recorded Recall:No data recorded [place] recorded Language:No data recorded  Psychomotor Activity  Psychomotor Activity:No data recorded  Assets  Assets:Communication Skills; Physical Health; Financial Resources/Insurance   Sleep  Sleep:Sleep: Fair   Physical Exam Neurological:     Motor: Tremor (present at rest) present.     Comments: Cogwheeling throughout extremities    ROS Blood pressure (!) 153/84, pulse 93, temperature 99.3 F (37.4 C), temperature source Oral, resp. rate 18, height '5\' 8"'$  (1.727 m), weight 70.3 kg, SpO2 95 %. Body mass index is 23.57 kg/m.  Treatment Plan Summary: Medication management and Plan   Schizoaffective:  Will continue current medications at this time.  -Will plan to slowly increase medications, as patient has been previously stable on the above medications at much higher doses.  -Will start Ingrezza '40mg'$  po daily.  This medication has been fully reviewed with the patient and verbal informed consent has been obtained by legal guardian. -Will obtain Depakote level.   Tardive Dyskinesia: Consent obtained from Nicholas Oconnell  to start medication INGREZZA.   Psychiatry will continue to follow him while in the hospital.  Disposition: No evidence of imminent risk to self or others at present.   Patient does not meet criteria for psychiatric inpatient admission. Supportive therapy provided about ongoing stressors. Discussed crisis plan, support from social network, calling 911, coming to the Emergency Department, and calling Suicide Hotline.  Suella Broad, FNP 01/11/2022 12:37 PM

## 2022-01-11 NOTE — Progress Notes (Signed)
Large bilious emesis at 0200  hypoactive bowel sounds, abdomen taut. Continue to monitior

## 2022-01-11 NOTE — Progress Notes (Signed)
Subjective: No acute events.  Objective: Vital signs in last 24 hours: Temp:  [98 F (36.7 C)-99.3 F (37.4 C)] 98 F (36.7 C) (08/10 1354) Pulse Rate:  [86-93] 86 (08/10 1354) Resp:  [16-18] 16 (08/10 1354) BP: (126-153)/(84-103) 126/103 (08/10 1354) SpO2:  [95 %-96 %] 96 % (08/10 1354) Last BM Date : 01/10/22  Intake/Output from previous day: 08/09 0701 - 08/10 0700 In: 240 [P.O.:240] Out: -  Intake/Output this shift: Total I/O In: 480 [P.O.:480] Out: 500 [Urine:500]  General appearance: alert and no distress GI: distended, tympanic, moderately firm  Lab Results: Recent Labs    01/09/22 0449 01/10/22 0503  WBC 7.5 8.7  HGB 13.7 14.6  HCT 42.7 46.2  PLT 254 295   BMET Recent Labs    01/09/22 0449 01/10/22 0503  NA 137 140  K 5.2* 5.0  CL 105 105  CO2 22 24  GLUCOSE 108* 100*  BUN 23 27*  CREATININE 1.47* 1.43*  CALCIUM 9.2 9.5   LFT No results for input(s): "PROT", "ALBUMIN", "AST", "ALT", "ALKPHOS", "BILITOT", "BILIDIR", "IBILI" in the last 72 hours. PT/INR No results for input(s): "LABPROT", "INR" in the last 72 hours. Hepatitis Panel No results for input(s): "HEPBSAG", "HCVAB", "HEPAIGM", "HEPBIGM" in the last 72 hours. C-Diff No results for input(s): "CDIFFTOX" in the last 72 hours. Fecal Lactopherrin No results for input(s): "FECLLACTOFRN" in the last 72 hours.  Studies/Results: DG Abd 1 View  Result Date: 01/11/2022 CLINICAL DATA:  Intractable nausea and vomiting. EXAM: ABDOMEN - 1 VIEW COMPARISON:  Abdominal x-ray from yesterday. FINDINGS: Diffuse colonic dilatation is not significantly changed. Multiple surgical clips in the left abdomen again noted. No acute osseous abnormality. IMPRESSION: 1. Unchanged colonic ileus. Electronically Signed   By: Titus Dubin M.D.   On: 01/11/2022 08:46   DG Abd 1 View  Result Date: 01/10/2022 CLINICAL DATA:  Vomiting.  History of bowel obstruction. EXAM: ABDOMEN - 1 VIEW COMPARISON:  Abdomen and pelvis  CT dated 01/03/2022 FINDINGS: Dilated right and transverse colon without significant change. Decreased caliber of the rectosigmoid colon. Significantly less stool. Postsurgical changes on the left with surgical clips. Unremarkable bones. IMPRESSION: Similar colonic ileus with a marked reduction in amount of stool. Electronically Signed   By: Claudie Revering M.D.   On: 01/10/2022 13:04    Medications: Scheduled:  divalproex  1,000 mg Oral QHS   enoxaparin (LOVENOX) injection  40 mg Subcutaneous Q24H   feeding supplement  1 Container Oral TID BM   haloperidol  2 mg Oral TID   lactulose  30 g Oral TID   lithium carbonate  150 mg Oral Daily   lithium carbonate  300 mg Oral QHS   polyethylene glycol  17 g Oral BID   QUEtiapine  100 mg Oral QHS   senna-docusate  2 tablet Oral BID   sorbitol, milk of mag, mineral oil, glycerin (SMOG) enema  960 mL Rectal Once   [START ON 01/12/2022] valbenazine  40 mg Oral Daily   Vitamin D (Ergocalciferol)  50,000 Units Oral Q7 days   zolpidem  10 mg Oral QHS   Continuous:  Assessment/Plan: 1) Chronic colonic ileus. 2) Schizoaffective D/O.   Compared to his abdominal examination last week he is much better.  The abdomen, even though it is still tense, is much less distended.  There is no evidence of an acute abdomen.  It appears that the repeated enemas are working along with Miralax.  He need to be on a type of  routine regimen chronically.  Plan: 1) Miralax BID or TID. 2) Continue with enemas. 3) No new recommendations. Signing off again.  Call with any questions.  LOS: 7 days   Tahara Ruffini D 01/11/2022, 4:15 PM

## 2022-01-12 ENCOUNTER — Inpatient Hospital Stay (HOSPITAL_COMMUNITY): Payer: Medicaid Other

## 2022-01-12 ENCOUNTER — Encounter (HOSPITAL_COMMUNITY): Payer: Self-pay | Admitting: Internal Medicine

## 2022-01-12 ENCOUNTER — Inpatient Hospital Stay (HOSPITAL_COMMUNITY): Payer: Medicaid Other | Admitting: Certified Registered"

## 2022-01-12 ENCOUNTER — Encounter (HOSPITAL_COMMUNITY)
Admission: EM | Disposition: A | Discharge status: Home Health | Payer: Self-pay | Source: Home / Self Care | Attending: Internal Medicine

## 2022-01-12 DIAGNOSIS — F419 Anxiety disorder, unspecified: Secondary | ICD-10-CM

## 2022-01-12 DIAGNOSIS — I10 Essential (primary) hypertension: Secondary | ICD-10-CM

## 2022-01-12 DIAGNOSIS — K5939 Other megacolon: Secondary | ICD-10-CM | POA: Diagnosis not present

## 2022-01-12 DIAGNOSIS — N179 Acute kidney failure, unspecified: Secondary | ICD-10-CM | POA: Diagnosis not present

## 2022-01-12 DIAGNOSIS — K5900 Constipation, unspecified: Secondary | ICD-10-CM | POA: Diagnosis not present

## 2022-01-12 DIAGNOSIS — K5981 Ogilvie syndrome: Secondary | ICD-10-CM | POA: Diagnosis not present

## 2022-01-12 DIAGNOSIS — F319 Bipolar disorder, unspecified: Secondary | ICD-10-CM

## 2022-01-12 DIAGNOSIS — R140 Abdominal distension (gaseous): Secondary | ICD-10-CM | POA: Diagnosis not present

## 2022-01-12 HISTORY — PX: COLONOSCOPY: SHX5424

## 2022-01-12 LAB — COMPREHENSIVE METABOLIC PANEL
ALT: 28 U/L (ref 0–44)
AST: 23 U/L (ref 15–41)
Albumin: 3.3 g/dL — ABNORMAL LOW (ref 3.5–5.0)
Alkaline Phosphatase: 102 U/L (ref 38–126)
Anion gap: 9 (ref 5–15)
BUN: 29 mg/dL — ABNORMAL HIGH (ref 8–23)
CO2: 26 mmol/L (ref 22–32)
Calcium: 9.2 mg/dL (ref 8.9–10.3)
Chloride: 107 mmol/L (ref 98–111)
Creatinine, Ser: 1.79 mg/dL — ABNORMAL HIGH (ref 0.61–1.24)
GFR, Estimated: 42 mL/min — ABNORMAL LOW (ref >60)
Glucose, Bld: 104 mg/dL — ABNORMAL HIGH (ref 70–99)
Potassium: 4.4 mmol/L (ref 3.5–5.1)
Sodium: 142 mmol/L (ref 135–145)
Total Bilirubin: 0.6 mg/dL (ref 0.3–1.2)
Total Protein: 7 g/dL (ref 6.5–8.1)

## 2022-01-12 LAB — CBC WITH DIFFERENTIAL/PLATELET
Abs Immature Granulocytes: 0.26 10*3/uL — ABNORMAL HIGH (ref 0.00–0.07)
Basophils Absolute: 0.1 10*3/uL (ref 0.0–0.1)
Basophils Relative: 1 %
Eosinophils Absolute: 0.1 10*3/uL (ref 0.0–0.5)
Eosinophils Relative: 1 %
HCT: 43.1 % (ref 39.0–52.0)
Hemoglobin: 13.9 g/dL (ref 13.0–17.0)
Immature Granulocytes: 3 %
Lymphocytes Relative: 21 %
Lymphs Abs: 2.1 10*3/uL (ref 0.7–4.0)
MCH: 29.9 pg (ref 26.0–34.0)
MCHC: 32.3 g/dL (ref 30.0–36.0)
MCV: 92.7 fL (ref 80.0–100.0)
Monocytes Absolute: 1.3 10*3/uL — ABNORMAL HIGH (ref 0.1–1.0)
Monocytes Relative: 13 %
Neutro Abs: 6.2 10*3/uL (ref 1.7–7.7)
Neutrophils Relative %: 61 %
Platelets: 394 10*3/uL (ref 150–400)
RBC: 4.65 MIL/uL (ref 4.22–5.81)
RDW: 13.2 % (ref 11.5–15.5)
WBC: 10.1 10*3/uL (ref 4.0–10.5)
nRBC: 0 % (ref 0.0–0.2)

## 2022-01-12 LAB — LIPASE, BLOOD: Lipase: 68 U/L — ABNORMAL HIGH (ref 11–51)

## 2022-01-12 LAB — MAGNESIUM: Magnesium: 3.1 mg/dL — ABNORMAL HIGH (ref 1.7–2.4)

## 2022-01-12 SURGERY — COLONOSCOPY
Anesthesia: General

## 2022-01-12 MED ORDER — LIDOCAINE 2% (20 MG/ML) 5 ML SYRINGE
INTRAMUSCULAR | Status: DC | PRN
  Administered 2022-01-12: 60 mg via INTRAVENOUS

## 2022-01-12 MED ORDER — PROPOFOL 10 MG/ML IV BOLUS
INTRAVENOUS | Status: AC
  Filled 2022-01-12: qty 20

## 2022-01-12 MED ORDER — IOHEXOL 9 MG/ML PO SOLN
ORAL | Status: AC
  Filled 2022-01-12: qty 1000

## 2022-01-12 MED ORDER — ONDANSETRON HCL 4 MG/2ML IJ SOLN
INTRAMUSCULAR | Status: DC | PRN
  Administered 2022-01-12: 4 mg via INTRAVENOUS

## 2022-01-12 MED ORDER — PROPOFOL 10 MG/ML IV BOLUS
INTRAVENOUS | Status: DC | PRN
  Administered 2022-01-12: 40 mg via INTRAVENOUS
  Administered 2022-01-12: 80 mg via INTRAVENOUS

## 2022-01-12 MED ORDER — IOHEXOL 9 MG/ML PO SOLN
500.0000 mL | ORAL | Status: AC
  Administered 2022-01-12 (×2): 500 mL via ORAL

## 2022-01-12 MED ORDER — SUCCINYLCHOLINE CHLORIDE 200 MG/10ML IV SOSY
PREFILLED_SYRINGE | INTRAVENOUS | Status: DC | PRN
  Administered 2022-01-12: 100 mg via INTRAVENOUS

## 2022-01-12 MED ORDER — SODIUM CHLORIDE 0.9 % IV SOLN: INTRAVENOUS | Status: DC

## 2022-01-12 MED ORDER — DEXAMETHASONE SODIUM PHOSPHATE 10 MG/ML IJ SOLN
INTRAMUSCULAR | Status: DC | PRN
  Administered 2022-01-12: 4 mg via INTRAVENOUS

## 2022-01-12 NOTE — Progress Notes (Signed)
The CT scan shows significant improvement, but he has projectile vomiting.  It is not clear if he has vomiting as a result of rapid ingestion or if it is associated with his colonic distension.  Even though the CT scan is improved, decompression will be performed to see if this can help with the vomiting.

## 2022-01-12 NOTE — TOC Progression Note (Signed)
Transition of Care [facility]) - Progression Note    Patient Details  Name: Nicholas Oconnell MRN: [id number] Date of Birth: 1960/08/03  [facility] CM/SW Contact  Lennart Pall, LCSW Phone Number: 01/12/2022, 12:53 PM  Clinical Narrative:    Updated group home liaison, Melody Haver [phone]), that pt not ready for dc today but possible 1-2 days per MD.     Expected Discharge Plan: Group Home Barriers to Discharge: Continued Medical Work up  Expected Discharge Plan and Services Expected Discharge Plan: Group Home In-house Referral: Clinical Social Work Discharge Planning Services: CM Consult Post Acute Care Choice: [place] arrangements for the past 2 months: Group Home Expected Discharge Date: 01/07/22               DME Arranged: N/A DME Agency: NA                   Social Determinants of Health (SDOH) Interventions    Readmission Risk Interventions    01/08/2022   10:05 AM  Readmission Risk Prevention Plan  Transportation Screening Complete  PCP or Specialist Appt within 5-7 Days Complete  Home Care Screening Complete  Medication Review (RN CM) Complete

## 2022-01-12 NOTE — Progress Notes (Signed)
PROGRESS NOTE    Nicholas Oconnell  AUQ:[id number] DOB: 04/03/1961 DOA: 01/03/2022 PCP: Neale Burly, MD   Brief Narrative:   61 y.o. male with medical history significant of Ogilvie syndrome, HTN, CKD3a, RCC s/p partial right nephrectomy, HTN, schizoaffective disorder presented with generalized weakness, poor PO intake and constipation.  He was admitted with acute on chronic constipation/Ogilvie syndrome.  GI was consulted.  Patient received serial enemas with improvement in abdominal distention.  GI subsequently signed off.  PT recommended SNF placement.  GI reconsulted on 01/11/2022 for persistent abdominal distention with vomiting.  Psychiatry also consulted for medication management.  Assessment & Plan:   Acute on chronic constipation/Ogilvie syndrome causing abdominal distention -GI evaluated and subsequently signed off.  Received serial enemas with improvement in his abdominal distention and pain.  He was placed on oral bowel regimen, however did not appear to have a bowel movement with orals alone.  Still requiring intermittent enema. -Continue current bowel regimen along with lactulose -Still having intermittent vomiting with abdominal distention.  GI was reconsulted on 01/11/2022: Recommended to use MiraLAX twice daily or 3 times daily.  Continue with enemas and signed off again.  Generalized weakness -patient lives in a group home and he is on high-dose psychiatric medications, which probably contributed to his weakness.  Apparently, he gets his psychiatric medications adjusted frequently at the group home.  When he gets weak and drowsy they cut down, and when he becomes agitated or not sleeping the doses are increased again.  Seroquel and Haldol decreased on 8/6 due to severe lethargy and he seems to be improved now.   -Psychiatry consulted for medication management.  Schizoaffective disorder/cognitive impairment -Psychiatry evaluation appreciated.  Patient was started on  valbenazine by psychiatry.  Currently also on lithium.  AKI on CKD stage IIIa -Baseline creatinine 1.3-1.5.  Creatinine 1.79 today.  Start IV fluids.  Hypertension -Blood pressure intermittently on the higher side.  Monitor.  Hypocalcemia/vitamin D deficiency -Continue replacement  History of tremor -There is a 11/09/21 note from family medicine that mentions evaluation with neurology. 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 It looks like no action has been taken on this. -Psychiatry evaluation. -Neuro evaluation as an outpatient.  Physical deconditioning -PT now recommends home health PT at group home.  Social worker following   DVT prophylaxis: Lovenox Code Status: Full Family Communication: None at bedside Disposition Plan: Status is: Inpatient Remains inpatient appropriate because: In 1 to 2 days if clinically improves and vomiting settles down.   Consultants: GI  Procedures: None  Antimicrobials: None   Subjective: Patient seen and examined at bedside.  Poor historian.  No seizures, fever or agitation reported.  Nursing staff reported vomiting overnight. Objective: Vitals:   01/10/22 1923 01/11/22 1354 01/11/22 2218 01/12/22 0624  BP: (!) 153/84 (!) 126/103 131/87 (!) 125/101  Pulse: 93 86 91 88  Resp: '18 16 18 18  '$ Temp: 99.3 F (37.4 C) 98 F (36.7 C) 99.1 F (37.3 C) 99 F (37.2 C)  TempSrc: Oral Oral Oral Oral  SpO2: 95% 96% 92% 94%  Weight:      Height:        Intake/Output Summary (Last 24 hours) at 01/12/2022 0807 Last data filed at 01/12/2022 0630 Gross per 24 hour  Intake 510 ml  Output 1100 ml  Net -590 ml    Filed Weights   01/03/22 2214  Weight: 70.3 kg    Examination:  General exam: On  room air currently.  No acute distress.  Chronically ill and deconditioned.  Poor historian.  Still extremely slow to respond.  Flat affect. Respiratory system: Bilateral  decreased breath sounds at bases; scattered crackles cardiovascular system: Rate mostly controlled; S1 and S2 are heard gastrointestinal system: Abdomen is still distended, soft and nontender.  Normal bowel sounds heard  extremities: No cyanosis; trace lower extremity edema present  Data Reviewed: I have personally reviewed following labs and imaging studies  CBC: Recent Labs  Lab 01/09/22 0449 01/10/22 0503 01/12/22 0629  WBC 7.5 8.7 10.1  NEUTROABS  --   --  6.2  HGB 13.7 14.6 13.9  HCT 42.7 46.2 43.1  MCV 92.6 94.3 92.7  PLT 254 295 166    Basic Metabolic Panel: Recent Labs  Lab 01/09/22 0449 01/10/22 0503 01/12/22 0629  NA 137 140 142  K 5.2* 5.0 4.4  CL 105 105 107  CO2 '22 24 26  '$ GLUCOSE 108* 100* 104*  BUN 23 27* 29*  CREATININE 1.47* 1.43* 1.79*  CALCIUM 9.2 9.5 9.2  MG  --   --  3.1*    GFR: Estimated Creatinine Clearance: 41.4 mL/min (A) (by C-G formula based on SCr of 1.79 mg/dL (H)). Liver Function Tests: Recent Labs  Lab 01/12/22 0629  AST 23  ALT 28  ALKPHOS 102  BILITOT 0.6  PROT 7.0  ALBUMIN 3.3*    Recent Labs  Lab 01/12/22 0629  LIPASE 68*   No results for input(s): "AMMONIA" in the last 168 hours. Coagulation Profile: No results for input(s): "INR", "PROTIME" in the last 168 hours. Cardiac Enzymes: No results for input(s): "CKTOTAL", "CKMB", "CKMBINDEX", "TROPONINI" in the last 168 hours. BNP (last 3 results) No results for input(s): "PROBNP" in the last 8760 hours. HbA1C: No results for input(s): "HGBA1C" in the last 72 hours. CBG: Recent Labs  Lab 01/08/22 1219  GLUCAP 57*    Lipid Profile: No results for input(s): "CHOL", "HDL", "LDLCALC", "TRIG", "CHOLHDL", "LDLDIRECT" in the last 72 hours. Thyroid Function Tests: No results for input(s): "TSH", "T4TOTAL", "FREET4", "T3FREE", "THYROIDAB" in the last 72 hours. Anemia Panel: No results for input(s): "VITAMINB12", "FOLATE", "FERRITIN", "TIBC", "IRON", "RETICCTPCT" in the  last 72 hours. Sepsis Labs: No results for input(s): "PROCALCITON", "LATICACIDVEN" in the last 168 hours.  No results found for this or any previous visit (from the past 240 hour(s)).       Radiology Studies: DG Abd 1 View  Result Date: 01/11/2022 CLINICAL DATA:  Intractable nausea and vomiting. EXAM: ABDOMEN - 1 VIEW COMPARISON:  Abdominal x-ray from yesterday. FINDINGS: Diffuse colonic dilatation is not significantly changed. Multiple surgical clips in the left abdomen again noted. No acute osseous abnormality. IMPRESSION: 1. Unchanged colonic ileus. Electronically Signed   By: Titus Dubin M.D.   On: 01/11/2022 08:46   DG Abd 1 View  Result Date: 01/10/2022 CLINICAL DATA:  Vomiting.  History of bowel obstruction. EXAM: ABDOMEN - 1 VIEW COMPARISON:  Abdomen and pelvis CT dated 01/03/2022 FINDINGS: Dilated right and transverse colon without significant change. Decreased caliber of the rectosigmoid colon. Significantly less stool. Postsurgical changes on the left with surgical clips. Unremarkable bones. IMPRESSION: Similar colonic ileus with a marked reduction in amount of stool. Electronically Signed   By: Claudie Revering M.D.   On: 01/10/2022 13:04        Scheduled Meds:  divalproex  1,000 mg Oral QHS   enoxaparin (LOVENOX) injection  40 mg Subcutaneous Q24H   feeding supplement  1  Container Oral TID BM   haloperidol  2 mg Oral TID   lactulose  30 g Oral TID   lithium carbonate  150 mg Oral Daily   lithium carbonate  300 mg Oral QHS   polyethylene glycol  17 g Oral BID   QUEtiapine  100 mg Oral QHS   senna-docusate  2 tablet Oral BID   sorbitol, milk of mag, mineral oil, glycerin (SMOG) enema  960 mL Rectal Once   valbenazine  40 mg Oral Daily   Vitamin D (Ergocalciferol)  50,000 Units Oral Q7 days   zolpidem  10 mg Oral QHS   Continuous Infusions:        Aline August, MD [facility] 01/12/2022, 8:07 AM

## 2022-01-12 NOTE — Anesthesia Preprocedure Evaluation (Addendum)
Anesthesia Evaluation  Patient identified by MRN, date of birth, ID band Patient awake    Reviewed: Allergy & Precautions, H&P , NPO status , Patient's Chart, lab work & pertinent test results, reviewed documented beta blocker date and time   Airway Mallampati: II  TM Distance: >3 FB Neck ROM: Full    Dental no notable dental hx. (+) Edentulous Upper, Edentulous Lower, Dental Advisory Given   Pulmonary neg pulmonary ROS,    Pulmonary exam normal breath sounds clear to auscultation       Cardiovascular hypertension, Pt. on medications and Pt. on home beta blockers  Rhythm:Regular Rate:Normal     Neuro/Psych Seizures -, Well Controlled,  Anxiety Bipolar Disorder    GI/Hepatic negative GI ROS, Neg liver ROS,   Endo/Other  negative endocrine ROS  Renal/GU Renal InsufficiencyRenal disease  negative genitourinary   Musculoskeletal   Abdominal   Peds  Hematology negative hematology ROS (+)   Anesthesia Other Findings   Reproductive/Obstetrics negative OB ROS                            Anesthesia Physical Anesthesia Plan  ASA: 3  Anesthesia Plan: General   Post-op Pain Management: Minimal or no pain anticipated   Induction: Intravenous, Rapid sequence and Cricoid pressure planned  PONV Risk Score and Plan: 2 and Propofol infusion and Ondansetron  Airway Management Planned: Oral ETT  Additional Equipment:   Intra-op Plan:   Post-operative Plan: Extubation in OR  Informed Consent: I have reviewed the patients History and Physical, chart, labs and discussed the procedure including the risks, benefits and alternatives for the proposed anesthesia with the patient or authorized representative who has indicated his/her understanding and acceptance.     Dental advisory given and Consent reviewed with POA  Plan Discussed with: CRNA  Anesthesia Plan Comments:        Anesthesia Quick  Evaluation

## 2022-01-12 NOTE — Anesthesia Procedure Notes (Signed)
Procedure Name: Intubation Date/Time: 01/12/2022 5:05 PM  Performed by: Renato Shin, CRNAPre-anesthesia Checklist: Patient identified, Emergency Drugs available, Suction available and Patient being monitored Patient Re-evaluated:Patient Re-evaluated prior to induction Oxygen Delivery Method: Circle system utilized Preoxygenation: Pre-oxygenation with 100% oxygen Induction Type: IV induction, Rapid sequence and Cricoid Pressure applied Laryngoscope Size: Miller and 3 Grade View: Grade I Tube type: Oral Tube size: 7.5 mm Number of attempts: 1 Airway Equipment and Method: Stylet Placement Confirmation: ETT inserted through vocal cords under direct vision, positive ETCO2 and breath sounds checked- equal and bilateral Secured at: 23 cm Tube secured with: Tape Dental Injury: Teeth and Oropharynx as per pre-operative assessment

## 2022-01-12 NOTE — Transfer of Care (Signed)
Immediate Anesthesia Transfer of Care Note  Patient: Nicholas Oconnell  Procedure(s) Performed: COLONOSCOPY  Patient Location: PACU  Anesthesia Type:General  Level of Consciousness: drowsy and patient cooperative  Airway & Oxygen Therapy: Patient Spontanous Breathing and Patient connected to face mask oxygen  Post-op Assessment: Report given to RN and Post -op Vital signs reviewed and stable  Post vital signs: Reviewed and stable  Last Vitals:  Vitals Value Taken Time  BP 140/90 01/12/22 1740  Temp    Pulse 70 01/12/22 1741  Resp 26 01/12/22 1741  SpO2 100 % 01/12/22 1741  Vitals shown include unvalidated device data.  Last Pain:  Vitals:   01/12/22 1740  TempSrc:   PainSc: Asleep         Complications: No notable events documented.

## 2022-01-12 NOTE — Progress Notes (Signed)
Pt vomited multiple times with RN in room after consuming first bottle of contrast - 255m suctioned directly from patients mouth during vomiting episode.  Emesis clear and undigested eggs.  Oral care completed.  MD order to proceed with CT without oral contrast consumption.

## 2022-01-12 NOTE — Progress Notes (Signed)
Emesis x 1 at 2200  approx 100cc bilious emesis,  Abdomen remains taut, hypoactive bowel sounds

## 2022-01-12 NOTE — Progress Notes (Signed)
Nutrition Follow-up  INTERVENTION:   -Boost Breeze po TID, each supplement provides 250 kcal and 9 grams of protein   NUTRITION DIAGNOSIS:   Inadequate oral intake related to constipation as evidenced by  (clear liquid diet).  Ongoing, now NPO.  GOAL:   Patient will meet greater than or equal to 90% of their needs  Not meeting.  MONITOR:   PO intake, Supplement acceptance, Labs, Weight trends, I & O's  ASSESSMENT:   61 y.o. male with medical history significant of Ogilvie syndrome, HTN, CKD3a, RCC s/p partial right nephrectomy, HTN, schizoaffective disorder. Presenting with generalized weakness, poor PO intake and constipation.  Pt with projectile vomiting today following ingestion of contrast. Now NPO, plan is for decompression. Pt was consuming 10-50% of meals while on dysphagia 3 diet.  Pt accepted 2 Boost Breeze supplements yesterday.   Medications: Lactulose, Miralax, Senokot, Vitamin D  Labs reviewed: CBGs: 42 Low Mg  Diet Order:   Diet Order             Diet NPO time specified  Diet effective now                   EDUCATION NEEDS:   No education needs have been identified at this time  Skin:  Skin Assessment: Reviewed RN Assessment  Last BM:  8/11 -type 7  Height:   Ht Readings from Last 1 Encounters:  01/03/22 '5\' 8"'$  (1.727 m)    Weight:   Wt Readings from Last 1 Encounters:  01/03/22 70.3 kg    BMI:  Body mass index is 23.57 kg/m.  Estimated Nutritional Needs:   Kcal:  1572-6203  Protein:  80-90g  Fluid:  1.9L/day  Clayton Bibles, MS, RD, LDN Inpatient Clinical Dietitian Contact information available via Amion

## 2022-01-12 NOTE — Progress Notes (Signed)
PT Cancellation Note  Patient Details Name: Chima Astorino MRN: [id number] DOB: 12/20/1960   Cancelled Treatment:    Reason Eval/Treat Not Completed: (P) Patient at procedure or test/unavailable Pt at colonoscopy, will follow up as pt status and schedule allows which may be another day.  Coolidge Breeze, PT, DPT [facility] Department Office: [phone] Pager: [phone]  Coolidge Breeze 01/12/2022, 5:48 PM

## 2022-01-12 NOTE — Progress Notes (Signed)
Pt had large BM this morning - stated he "feels better".  Bowel sounds hypoactive.  Will continue to monitor.

## 2022-01-12 NOTE — Op Note (Addendum)
[facility] Patient Name: Nicholas Oconnell Procedure Date: 01/12/2022 MRN: [id number] Attending MD: Carol Ada , MD Date of Birth: 04/18/1961 CSN: [id number] Age: 61 Admit Type: Outpatient Procedure:                Colonoscopy Indications:              Therapeutic procedure Providers:                Carol Ada, MD, Dulcy Fanny, Despina Pole, Technician Referring MD:              Medicines:                General Anesthesia Complications:            No immediate complications. Estimated Blood Loss:     Estimated blood loss: none. Procedure:                Pre-Anesthesia Assessment:                           - Prior to the procedure, a History and Physical                            was performed, and patient medications and                            allergies were reviewed. The patient's tolerance of                            previous anesthesia was also reviewed. The risks                            and benefits of the procedure and the sedation                            options and risks were discussed with the patient.                            All questions were answered, and informed consent                            was obtained. Prior Anticoagulants: The patient has                            taken no previous anticoagulant or antiplatelet                            agents. ASA Grade Assessment: III - A patient with                            severe systemic disease. After reviewing the risks                            and benefits, the patient was  deemed in                            satisfactory condition to undergo the procedure.                           - Sedation was administered by an anesthesia                            professional. Deep sedation was attained.                           After obtaining informed consent, the colonoscope                            was passed under direct vision. Throughout the                             procedure, the patient's blood pressure, pulse, and                            oxygen saturations were monitored continuously. The                            CF-HQ190L ([id number] Olympus colonoscope was                            introduced through the anus and advanced to the the                            cecum, identified by appendiceal orifice and                            ileocecal valve. The colonoscopy was technically                            difficult and complex due to poor bowel prep. The                            patient tolerated the procedure well. The quality                            of the bowel preparation was evaluated using the                            BBPS [facility] Bowel Preparation Scale) with scores                            of: Right Colon = 0 (unprepared, mucosa not seen                            due to solid stool that cannot be cleared or unseen  proximal colon segment in a colonoscopy aborted due                            to inadequate bowel prep), Transverse Colon = 0                            (unprepared, mucosa not seen due to solid stool                            that cannot be cleared or unseen proximal colon                            segment in a colonoscopy aborted due to inadequate                            bowel prep) and Left Colon = 0 (unprepared, mucosa                            not seen due to solid stool that cannot be cleared                            or unseen proximal colon segment in a colonoscopy                            aborted due to inadequate bowel prep). The total                            BBPS score equals 0. The ileocecal valve,                            appendiceal orifice, and rectum were photographed. Scope In: 5:08:36 PM Scope Out: 5:18:41 PM Scope Withdrawal Time: 0 hours 7 minutes 28 seconds  Total Procedure Duration: 0 hours 10 minutes 5 seconds  Findings:       The lumen of the descending colon, transverse colon, ascending colon and       appendiceal orifice was significantly dilated.      The colon was severely dilated and it was successfully decompressed.       Palpation of the abdomen showed that it was soft. Impression:               - Dilated in the descending colon, in the                            transverse colon, in the ascending colon and at the                            appendiceal orifice.                           - No specimens collected. Moderate Sedation:      Not Applicable - Patient had care per Anesthesia. Recommendation:           - Return patient to hospital ward for ongoing care.                           -  Resume regular diet.                           - Continue present medications. Procedure Code(s):        --- Professional ---                           [phone], Colonoscopy, flexible; diagnostic, including                            collection of specimen(s) by brushing or washing,                            when performed (separate procedure) Diagnosis Code(s):        --- Professional ---                           K59.39, Other megacolon CPT copyright 2019 American Medical Association. All rights reserved. The codes documented in this report are preliminary and upon coder review may  be revised to meet current compliance requirements. Carol Ada, MD Carol Ada, MD 01/12/2022 5:18:57 PM This report has been signed electronically. Number of Addenda: 0

## 2022-01-12 NOTE — Anesthesia Postprocedure Evaluation (Signed)
Anesthesia Post Note  Patient: Nicholas Oconnell  Procedure(s) Performed: COLONOSCOPY     Patient location during evaluation: Endoscopy Anesthesia Type: General Level of consciousness: awake and alert Pain management: pain level controlled Vital Signs Assessment: post-procedure vital signs reviewed and stable Respiratory status: spontaneous breathing, nonlabored ventilation and respiratory function stable Cardiovascular status: blood pressure returned to baseline and stable Postop Assessment: no apparent nausea or vomiting Anesthetic complications: no   No notable events documented.  Last Vitals:  Vitals:   01/12/22 1810 01/12/22 1820  BP: 125/85 135/79  Pulse: 82 82  Resp: 13 16  Temp:  36.6 C  SpO2: 94% 98%    Last Pain:  Vitals:   01/12/22 1820  TempSrc: Oral  PainSc: 0-No pain                 Gertie Broerman,W. EDMOND

## 2022-01-13 ENCOUNTER — Encounter (HOSPITAL_COMMUNITY): Payer: Self-pay | Admitting: Gastroenterology

## 2022-01-13 DIAGNOSIS — R531 Weakness: Secondary | ICD-10-CM | POA: Diagnosis not present

## 2022-01-13 DIAGNOSIS — R140 Abdominal distension (gaseous): Secondary | ICD-10-CM | POA: Diagnosis not present

## 2022-01-13 DIAGNOSIS — K5900 Constipation, unspecified: Secondary | ICD-10-CM | POA: Diagnosis not present

## 2022-01-13 DIAGNOSIS — K5981 Ogilvie syndrome: Secondary | ICD-10-CM | POA: Diagnosis not present

## 2022-01-13 LAB — BASIC METABOLIC PANEL
Anion gap: 9 (ref 5–15)
BUN: 27 mg/dL — ABNORMAL HIGH (ref 8–23)
CO2: 26 mmol/L (ref 22–32)
Calcium: 8.7 mg/dL — ABNORMAL LOW (ref 8.9–10.3)
Chloride: 106 mmol/L (ref 98–111)
Creatinine, Ser: 1.52 mg/dL — ABNORMAL HIGH (ref 0.61–1.24)
GFR, Estimated: 51 mL/min — ABNORMAL LOW (ref >60)
Glucose, Bld: 90 mg/dL (ref 70–99)
Potassium: 4.3 mmol/L (ref 3.5–5.1)
Sodium: 141 mmol/L (ref 135–145)

## 2022-01-13 LAB — MAGNESIUM: Magnesium: 2.7 mg/dL — ABNORMAL HIGH (ref 1.7–2.4)

## 2022-01-13 MED ORDER — PROCHLORPERAZINE EDISYLATE 10 MG/2ML IJ SOLN
10.0000 mg | Freq: Once | INTRAMUSCULAR | Status: AC
  Administered 2022-01-13: 10 mg via INTRAVENOUS
  Filled 2022-01-13: qty 2

## 2022-01-13 NOTE — Progress Notes (Signed)
PROGRESS NOTE    Nicholas Oconnell  RCV:[id number] DOB: 1960/07/21 DOA: 01/03/2022 PCP: Neale Burly, MD   Brief Narrative:   61 y.o. male with medical history significant of Ogilvie syndrome, HTN, CKD3a, RCC s/p partial right nephrectomy, HTN, schizoaffective disorder presented with generalized weakness, poor PO intake and constipation.  He was admitted with acute on chronic constipation/Ogilvie syndrome.  GI was consulted.  Patient received serial enemas with improvement in abdominal distention.  GI subsequently signed off.  PT recommended SNF placement.  GI reconsulted on 01/11/2022 for persistent abdominal distention with vomiting.  Psychiatry also consulted for medication management.  Due to persistent abdominal distention and vomiting, patient underwent colonoscopy on 01/12/2022.  Assessment & Plan:   Acute on chronic constipation/Ogilvie syndrome causing abdominal distention -GI evaluated and subsequently signed off.  Received serial enemas with improvement in his abdominal distention and pain.  He was placed on oral bowel regimen, however did not appear to have a bowel movement with orals alone.   -Continue current bowel regimen along with lactulose -Was still having intermittent vomiting with abdominal distention.  GI was reconsulted on 01/11/2022: underwent colonoscopy on 01/12/2022.  Follow further GI recommendations.  Generalized weakness -patient lives in a group home and he is on high-dose psychiatric medications, which probably contributed to his weakness.  Apparently, he gets his psychiatric medications adjusted frequently at the group home.  When he gets weak and drowsy they cut down, and when he becomes agitated or not sleeping the doses are increased again.  Seroquel and Haldol decreased on 8/6 due to severe lethargy and he seems to be improved now.   -Psychiatry consulted for medication management.  Schizoaffective disorder/cognitive impairment -Psychiatry evaluation  appreciated.  Patient was started on valbenazine by psychiatry.  Currently also on lithium. -Reconsult psychiatry for increasing tremors  AKI on CKD stage IIIa -Baseline creatinine 1.3-1.5.  Creatinine improving to 1.5 to today.  Continue IV fluids.  Repeat a.m. labs  Hypertension -Blood pressure intermittently on the higher side.  Monitor.  Hypocalcemia/vitamin D deficiency -Continue replacement  History of tremor -There is a 11/09/21 note from family medicine that mentions evaluation with neurology. 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 It looks like no action has been taken on this. -Psychiatry started patient on valbenazine.  Plan as above -Neuro evaluation as an outpatient.  Physical deconditioning -PT now recommends home health PT at group home.  Social worker following   DVT prophylaxis: Lovenox Code Status: Full Family Communication: None at bedside Disposition Plan: Status is: Inpatient Remains inpatient appropriate because: In 1 to 2 days if clinically improves and cleared by GI and psychiatry.  Consultants: GI/psychiatry  Procedures: Colonoscopy on 01/12/2022  Antimicrobials: None   Subjective: Patient seen and examined at bedside.  Poor historian.  Nursing staff reports 1 episode of vomiting overnight.  No seizures or agitation reported.  Increasing tremors reported per nursing staff.    Objective: Vitals:   01/12/22 1810 01/12/22 1820 01/12/22 1929 01/13/22 0359  BP: 125/85 135/79 116/76 112/88  Pulse: 82 82 88 83  Resp: '13 16 16 16  '$ Temp:  97.9 F (36.6 C) 98.8 F (37.1 C) 97.8 F (36.6 C)  TempSrc:  Oral Oral Oral  SpO2: 94% 98% 96% 96%  Weight:      Height:        Intake/Output Summary (Last 24 hours) at 01/13/2022 1020 Last data filed at 01/13/2022 0900 Gross per 24 hour  Intake 532.84  ml  Output 250 ml  Net 282.84 ml    Filed Weights   01/03/22 2214  Weight:  70.3 kg    Examination:  General exam: No distress.  Still on room air.  Chronically ill and deconditioned.  Poor historian.  Still extremely slow to respond.  Flat affect.  Upper extremity tremors noted Respiratory system: Decreased breath sounds at bases bilaterally, no wheezing cardiovascular system: S1, S2 heard; rate controlled currently  gastrointestinal system: Abdomen is much less distended today, soft and nontender.  Bowel sounds heard  extremities: Mild lower extremity edema present bilaterally; no clubbing   data Reviewed: I have personally reviewed following labs and imaging studies  CBC: Recent Labs  Lab 01/09/22 0449 01/10/22 0503 01/12/22 0629  WBC 7.5 8.7 10.1  NEUTROABS  --   --  6.2  HGB 13.7 14.6 13.9  HCT 42.7 46.2 43.1  MCV 92.6 94.3 92.7  PLT 254 295 557    Basic Metabolic Panel: Recent Labs  Lab 01/09/22 0449 01/10/22 0503 01/12/22 0629 01/13/22 0519  NA 137 140 142 141  K 5.2* 5.0 4.4 4.3  CL 105 105 107 106  CO2 '22 24 26 26  '$ GLUCOSE 108* 100* 104* 90  BUN 23 27* 29* 27*  CREATININE 1.47* 1.43* 1.79* 1.52*  CALCIUM 9.2 9.5 9.2 8.7*  MG  --   --  3.1* 2.7*    GFR: Estimated Creatinine Clearance: 48.8 mL/min (A) (by C-G formula based on SCr of 1.52 mg/dL (H)). Liver Function Tests: Recent Labs  Lab 01/12/22 0629  AST 23  ALT 28  ALKPHOS 102  BILITOT 0.6  PROT 7.0  ALBUMIN 3.3*    Recent Labs  Lab 01/12/22 0629  LIPASE 68*    No results for input(s): "AMMONIA" in the last 168 hours. Coagulation Profile: No results for input(s): "INR", "PROTIME" in the last 168 hours. Cardiac Enzymes: No results for input(s): "CKTOTAL", "CKMB", "CKMBINDEX", "TROPONINI" in the last 168 hours. BNP (last 3 results) No results for input(s): "PROBNP" in the last 8760 hours. HbA1C: No results for input(s): "HGBA1C" in the last 72 hours. CBG: Recent Labs  Lab 01/08/22 1219  GLUCAP 57*    Lipid Profile: No results for input(s): "CHOL",  "HDL", "LDLCALC", "TRIG", "CHOLHDL", "LDLDIRECT" in the last 72 hours. Thyroid Function Tests: No results for input(s): "TSH", "T4TOTAL", "FREET4", "T3FREE", "THYROIDAB" in the last 72 hours. Anemia Panel: No results for input(s): "VITAMINB12", "FOLATE", "FERRITIN", "TIBC", "IRON", "RETICCTPCT" in the last 72 hours. Sepsis Labs: No results for input(s): "PROCALCITON", "LATICACIDVEN" in the last 168 hours.  No results found for this or any previous visit (from the past 240 hour(s)).       Radiology Studies: CT ABDOMEN PELVIS WO CONTRAST  Result Date: 01/12/2022 CLINICAL DATA:  Weakness and fatigue. Constipation. Suspicion for bowel obstruction. EXAM: CT ABDOMEN AND PELVIS WITHOUT CONTRAST TECHNIQUE: Multidetector CT imaging of the abdomen and pelvis was performed following the standard protocol without IV contrast. RADIATION DOSE REDUCTION: This exam was performed according to the departmental dose-optimization program which includes automated exposure control, adjustment of the mA and/or kV according to patient size and/or use of iterative reconstruction technique. COMPARISON:  01/03/2022 FINDINGS: Study mildly degraded by patient motion. Lower chest: Small right and trace left pleural effusions, new. Mild dependent lower lobe atelectasis, increased from the prior exam. Hepatobiliary: No focal liver abnormality is seen. No gallstones, gallbladder wall thickening, or biliary dilatation. Pancreas: Unremarkable. No pancreatic ductal dilatation or surrounding inflammatory changes. Spleen: Normal  in size without focal abnormality. Adrenals/Urinary Tract: No adrenal masses. No renal mass, stone or hydronephrosis. Normal ureters. Normal bladder. Stomach/Bowel: Dilated sigmoid and right colon sigmoid colon maximum of 6.8 cm right colon maximum of 6.5 cm. No colonic wall thickening. No inflammation. Small bowel is normal in caliber. Unremarkable stomach. Multiple surgical vascular clips are noted in left  upper quadrant, stable. No significant increase in the colonic stool burden. Vascular/Lymphatic: No significant vascular findings are present. No enlarged abdominal or pelvic lymph nodes. Reproductive: Unremarkable. Other: No abdominal wall hernia or abnormality. No abdominopelvic ascites. Musculoskeletal: No fracture or acute finding.  No bone lesion. IMPRESSION: 1. There is been significant interval improvement. Colonic dilation has decreased from the prior study and there has been a significant decrease in the colonic stool burden. 2. No evidence of bowel obstruction or inflammation. 3. Small right and trace left pleural effusions with associated basilar atelectasis, effusions new, atelectasis increased from the prior study. Electronically Signed   By: Lajean Manes M.D.   On: 01/12/2022 14:40   DG Abd 1 View  Result Date: 01/12/2022 CLINICAL DATA:  [id number]; follow up ileus EXAM: ABDOMEN - 1 VIEW COMPARISON:  Abdominal x-ray from yesterday FINDINGS: Again seen is the severe diffuse gaseous distention of the colon with a gaseous distended transverse colon measuring up to 10 cm and has mildly increased in the interim. Multiple surgical clips in the left abdomen. IMPRESSION: Severe diffuse gaseous distention of the colon and has mildly increased in the interim. Findings may be on the basis of ileus. Continued follow-up is suggested. Electronically Signed   By: Frazier [name] M.D.   On: 01/12/2022 08:54        Scheduled Meds:  divalproex  1,000 mg Oral QHS   enoxaparin (LOVENOX) injection  40 mg Subcutaneous Q24H   feeding supplement  1 Container Oral TID BM   haloperidol  2 mg Oral TID   lactulose  30 g Oral TID   lithium carbonate  150 mg Oral Daily   lithium carbonate  300 mg Oral QHS   polyethylene glycol  17 g Oral BID   QUEtiapine  100 mg Oral QHS   senna-docusate  2 tablet Oral BID   sorbitol, milk of mag, mineral oil, glycerin (SMOG) enema  960 mL Rectal Once   valbenazine  40 mg Oral  Daily   Vitamin D (Ergocalciferol)  50,000 Units Oral Q7 days   zolpidem  10 mg Oral QHS   Continuous Infusions:  sodium chloride 75 mL/hr at 01/12/22 2119          Aline August, MD [facility] 01/13/2022, 10:20 AM

## 2022-01-13 NOTE — Progress Notes (Signed)
    Covering for Dr. Benson Norway   Patient Name: Nicholas Oconnell Date of Encounter: 01/13/2022, 10:54 AM    Subjective  Patient without complaint as best I can tell.  He might have had some vomiting last night but tolerated breakfast this morning without difficulty and nursing staff reports he defecated overnight as well.  Timing of that vomiting is not clear he was vomiting prior to the decompressive colonoscopy.   Objective  BP 112/88 (BP Location: Right Arm)   Pulse 83   Temp 97.8 F (36.6 C) (Oral)   Resp 16   Ht '5\' 8"'$  (1.727 m)   Wt 70.3 kg   SpO2 96%   BMI 23.57 kg/m  Protuberant tympanitic but soft abdomen with positive bowel sounds.  Surgical scars present.  Lab Results  Component Value Date   CREATININE 1.52 (H) 01/13/2022   BUN 27 (H) 01/13/2022   NA 141 01/13/2022   K 4.3 01/13/2022   CL 106 01/13/2022   CO2 26 01/13/2022   Lab Results  Component Value Date   WBC 10.1 01/12/2022   HGB 13.9 01/12/2022   HCT 43.1 01/12/2022   MCV 92.7 01/12/2022   PLT 394 01/12/2022     Assessment and Plan  #1 chronic colonic ileus/pseudoobstruction  #2 schizoaffective disorder on multiple psychotropic agents there is a question of drug induced Parkinson's which could be contributing to his colonic issues  He is improved status post colonic decompression  Continue current treatment plan    Gatha Mayer, MD, [facility] See Shea Evans on call - gastroenterology for best contact person 01/13/2022 10:54 AM

## 2022-01-13 NOTE — Progress Notes (Signed)
Pt vomit X one at this time. 500CC of black/green undigested food. Pt states "I feel better now".  Will continue to monitor.

## 2022-01-13 NOTE — Progress Notes (Signed)
Physical Therapy Treatment Patient Details Name: Nicholas Oconnell MRN: [id number] DOB: 09/30/1960 Today's Date: 01/13/2022   History of Present Illness 61 y.o. male presenting from a group home with generalized weakness, poor PO intake and constipation and admitted 01/03/22 for Acute on chronic constipation, Ogilvie syndrome, abdominal distention. Past medical history significant of Ogilvie syndrome, HTN, CKD3a, RCC s/p partial right nephrectomy, HTN, schizoaffective disorder.    PT Comments    Patient making steady but slow progress with mobility. He requires min guard for transfers and demonstrates safe technique to rise, with occasional cues needed for safe reach back to sit. Min-Mod assist required for gait and balance improved with shoes and HHA rather than RW. Will continue to progress as able, continue to recommend constant assist at group home with HHPT.      Recommendations for follow up therapy are one component of a multi-disciplinary discharge planning process, led by the attending physician.  Recommendations may be updated based on patient status, additional functional criteria and insurance authorization.  Follow Up Recommendations  Home health PT (at [facility]) Can patient physically be transported by private vehicle: No   Assistance Recommended at Discharge Frequent or constant Supervision/Assistance  Patient can return home with the following A little help with walking and/or transfers   Equipment Recommendations  None recommended by PT    Recommendations for Other Services       Precautions / Restrictions Precautions Precautions: Fall Restrictions Weight Bearing Restrictions: No     Mobility  Bed Mobility Overal bed mobility: Needs Assistance Bed Mobility: Supine to Sit     Supine to sit: Min assist, HOB elevated     General bed mobility comments: cues to use bed rail, no overt LOB noted.    Transfers Overall transfer level: Needs assistance Equipment  used: None Transfers: Sit to/from Stand Sit to Stand: Min assist           General transfer comment: Min assist to steady with power up from EOB and recliner. pt with good sequencing to use bil UE from EOB or armrest to initiate rise.    Ambulation/Gait Ambulation/Gait assistance: Min assist, Mod assist Gait Distance (Feet): 120 Feet (30, 20, 30, 40) Assistive device: Rolling walker (2 wheels), 1 person hand held assist Gait Pattern/deviations: Step-to pattern, Trunk flexed, Narrow base of support, Ataxic, Knee flexed in stance - right, Knee flexed in stance - left Gait velocity: decr     General Gait Details: Pt using RW initially and Mod assist required to steady balance and manage RW. Pt much steadier with his shoes on and with HHA vs RW. Min assist with intermittent Mod assist to prevent LOB when tremors began. Overall pt has flexed posture and when tremors/gait begins to festinate pt moves arms into high guard position.   Stairs             Wheelchair Mobility    Modified Rankin (Stroke Patients Only)       Balance Overall balance assessment: Needs assistance Sitting-balance support: Bilateral upper extremity supported, Feet supported Sitting balance-Leahy Scale: Fair     Standing balance support: Bilateral upper extremity supported, During functional activity, Reliant on assistive device for balance Standing balance-Leahy Scale: Poor                              Cognition Arousal/Alertness: Awake/alert Behavior During Therapy: WFL for tasks assessed/performed Overall Cognitive Status: History of cognitive impairments - at baseline  General Comments: pleasant, cooperative, and eager to mobilize.        Exercises      General Comments        Pertinent Vitals/Pain Pain Assessment Pain Assessment: No/denies pain    Home Living                          Prior Function             PT Goals (current goals can now be found in the care plan section) Acute Rehab PT Goals PT Goal Formulation: Patient unable to participate in goal setting Time For Goal Achievement: 01/18/22 Potential to Achieve Goals: Fair Progress towards PT goals: Progressing toward goals    Frequency    Min 2X/week      PT Plan Current plan remains appropriate    Co-evaluation              AM-PAC PT "6 Clicks" Mobility   Outcome Measure  Help needed turning from your back to your side while in a flat bed without using bedrails?: A Little Help needed moving from lying on your back to sitting on the side of a flat bed without using bedrails?: A Little Help needed moving to and from a bed to a chair (including a wheelchair)?: A Little Help needed standing up from a chair using your arms (e.g., wheelchair or bedside chair)?: A Little Help needed to walk in hospital room?: A Lot Help needed climbing 3-5 steps with a railing? : A Lot 6 Click Score: 16    End of Session Equipment Utilized During Treatment: Gait belt Activity Tolerance: Patient tolerated treatment well Patient left: in chair;with call bell/phone within reach;with chair alarm set Nurse Communication: Mobility status PT Visit Diagnosis: Muscle weakness (generalized) (M62.81);Other abnormalities of gait and mobility (R26.89);History of falling (Z91.81)     Time: 9030-0923 PT Time Calculation (min) (ACUTE ONLY): 32 min  Charges:  $Gait Training: 23-37 mins                     Verner Mould, DPT Acute Rehabilitation Services Office [phone] Pager [phone]  01/13/22 3:08 PM

## 2022-01-13 NOTE — Progress Notes (Signed)
Patient has vomitted at 2300; about 457m; bile color and indigested food after he had dinner. Zofran was given as PRN ordered. Paged MD on call and waiting for new order. Will continue to monitor patient.

## 2022-01-14 DIAGNOSIS — K5981 Ogilvie syndrome: Secondary | ICD-10-CM | POA: Diagnosis not present

## 2022-01-14 DIAGNOSIS — Z8669 Personal history of other diseases of the nervous system and sense organs: Secondary | ICD-10-CM | POA: Diagnosis not present

## 2022-01-14 DIAGNOSIS — R531 Weakness: Secondary | ICD-10-CM | POA: Diagnosis not present

## 2022-01-14 DIAGNOSIS — K5900 Constipation, unspecified: Secondary | ICD-10-CM | POA: Diagnosis not present

## 2022-01-14 LAB — BASIC METABOLIC PANEL
Anion gap: 8 (ref 5–15)
BUN: 22 mg/dL (ref 8–23)
CO2: 25 mmol/L (ref 22–32)
Calcium: 8.6 mg/dL — ABNORMAL LOW (ref 8.9–10.3)
Chloride: 110 mmol/L (ref 98–111)
Creatinine, Ser: 1.51 mg/dL — ABNORMAL HIGH (ref 0.61–1.24)
GFR, Estimated: 52 mL/min — ABNORMAL LOW (ref >60)
Glucose, Bld: 94 mg/dL (ref 70–99)
Potassium: 4.1 mmol/L (ref 3.5–5.1)
Sodium: 143 mmol/L (ref 135–145)

## 2022-01-14 NOTE — Plan of Care (Signed)

## 2022-01-14 NOTE — Progress Notes (Signed)
Physical Therapy Treatment Patient Details Name: Nicholas Oconnell MRN: [id number] DOB: [birth year] Today's Date: 01/14/2022   History of Present Illness 61 y.o. male presenting from a group home with generalized weakness, poor PO intake and constipation and admitted 01/03/22 for Acute on chronic constipation, Ogilvie syndrome, abdominal distention. Past medical history significant of Ogilvie syndrome, HTN, CKD3a, RCC s/p partial right nephrectomy, HTN, schizoaffective disorder.    PT Comments    Pt is cooperative and motivated to mobilize. Continues to require min to mod assist with ambulation. Fatigues more rapidly today requiring multiple seated rests. Continue PT in acute setting   Recommendations for follow up therapy are one component of a multi-disciplinary discharge planning process, led by the attending physician.  Recommendations may be updated based on patient status, additional functional criteria and insurance authorization.  Follow Up Recommendations  Home health PT (at [facility]) Can patient physically be transported by private vehicle: No   Assistance Recommended at Discharge Frequent or constant Supervision/Assistance  Patient can return home with the following A little help with walking and/or transfers;A little help with bathing/dressing/bathroom   Equipment Recommendations  None recommended by PT    Recommendations for Other Services       Precautions / Restrictions Precautions Precautions: Fall Restrictions Weight Bearing Restrictions: No     Mobility  Bed Mobility Overal bed mobility: Needs Assistance Bed Mobility: Supine to Sit     Supine to sit: Min assist, HOB elevated     General bed mobility comments: step by step verbal cues to complete task, physical assist to elevate trunk    Transfers Overall transfer level: Needs assistance Equipment used: None, Rolling walker (2 wheels) Transfers: Sit to/from Stand Sit to Stand: Min assist, Min guard            General transfer comment: Min assist to min/guard to steady with power up from EOB and recliner. pt with good sequencing to use bil UE from EOB or armrest to initiate rise, uses momentum to come to stand. hand over hand to reach back to chair, assist to control descent    Ambulation/Gait Ambulation/Gait assistance: Min assist, Mod assist Gait Distance (Feet): 20 Feet (x3) Assistive device: Rolling walker (2 wheels), 1 person hand held assist Gait Pattern/deviations: Trunk flexed, Narrow base of support, Ataxic, Knee flexed in stance - right, Knee flexed in stance - left, Step-to pattern, Step-through pattern Gait velocity: decr     General Gait Details: Min assist with intermittent Mod assist to prevent LOB d/t ataxia and tremors. Overall pt has flexed posture, with incr distance  tremors/gait worsen and pt begins to festinate after ~ 10-15'. able to recover balance and slow gait speed with assist and multi-modal cues   Stairs             Wheelchair Mobility    Modified Rankin (Stroke Patients Only)       Balance Overall balance assessment: Needs assistance Sitting-balance support: Bilateral upper extremity supported, Feet supported Sitting balance-Leahy Scale: Fair     Standing balance support: Bilateral upper extremity supported, During functional activity, Reliant on assistive device for balance, Single extremity supported Standing balance-Leahy Scale: Poor Standing balance comment: reliant on external assist for dynamic acitivities. very briefly able to maintian static stand without support                            Cognition Arousal/Alertness: Awake/alert Behavior During Therapy: WFL for tasks assessed/performed Overall Cognitive Status:  History of cognitive impairments - at baseline                                 General Comments: pleasant, cooperative, and eager to mobilize.        Exercises      General Comments         Pertinent Vitals/Pain Pain Assessment Pain Assessment: No/denies pain    Home Living                          Prior Function            PT Goals (current goals can now be found in the care plan section) Acute Rehab PT Goals PT Goal Formulation: Patient unable to participate in goal setting Time For Goal Achievement: 01/18/22 Potential to Achieve Goals: Fair Progress towards PT goals: Progressing toward goals    Frequency    Min 2X/week      PT Plan Current plan remains appropriate    Co-evaluation              AM-PAC PT "6 Clicks" Mobility   Outcome Measure  Help needed turning from your back to your side while in a flat bed without using bedrails?: A Little Help needed moving from lying on your back to sitting on the side of a flat bed without using bedrails?: A Little Help needed moving to and from a bed to a chair (including a wheelchair)?: A Little Help needed standing up from a chair using your arms (e.g., wheelchair or bedside chair)?: A Little Help needed to walk in hospital room?: A Lot Help needed climbing 3-5 steps with a railing? : A Lot 6 Click Score: 16    End of Session Equipment Utilized During Treatment: Gait belt Activity Tolerance: Patient tolerated treatment well Patient left: in chair;with call bell/phone within reach;with chair alarm set Nurse Communication: Mobility status PT Visit Diagnosis: Muscle weakness (generalized) (M62.81);Other abnormalities of gait and mobility (R26.89);History of falling (Z91.81)     Time: 1040-1050 PT Time Calculation (min) (ACUTE ONLY): 10 min  Charges:  $Gait Training: 8-22 mins                     Baxter Flattery, PT  Acute Rehab Dept [facility]) [phone]  [facility] Weekend Pager [facility] only)  [phone]  01/14/2022    [facility] 01/14/2022, 12:08 PM

## 2022-01-14 NOTE — Progress Notes (Addendum)
    Covering for Dr. Benson Norway   Patient Name: Nicholas Oconnell Date of Encounter: 01/14/2022, 11:10 AM    Subjective  Vomiting recurred once last night and again this morning 1 stool overnight.  Denies pain.  Doing PT.   Objective  BP (!) 146/92 (BP Location: Right Arm)   Pulse 82   Temp 98.8 F (37.1 C) (Oral)   Resp 20   Ht '5\' 8"'$  (1.727 m)   Wt 70.3 kg   SpO2 92%   BMI 23.57 kg/m  Abdomen is protuberant and soft and nontender.  Bowel sounds positive  Lab Results  Component Value Date   CREATININE 1.51 (H) 01/14/2022   BUN 22 01/14/2022   NA 143 01/14/2022   K 4.1 01/14/2022   CL 110 01/14/2022   CO2 25 01/14/2022   Mg 2.7   Assessment and Plan  #1 chronic colonic ileus/pseudoobstruction  #2 vomiting - ? Related to above (GI motility disturbance)   #3 schizoaffective disorder on multiple psychotropic agents there is a question of drug induced Parkinson's which could be contributing to his colonic issues  ---------------------------------------------------------------------------  He is not unwell, he regurgitates or vomits intermittently and this started again last night.  I am not sure what else might be done at this point.  Metoclopramide is not an option given concern for Parkinson's.  Perhaps with more activity (doing PT and walking) things will improve.  His magnesium level is elevated question if that could be related though I doubt it.  Dr. Benson Norway to follow-up tomorrow   Gatha Mayer, MD, [facility] See Shea Evans on call - gastroenterology for best contact person 01/14/2022 11:10 AM

## 2022-01-14 NOTE — Progress Notes (Signed)
PROGRESS NOTE    Nicholas Oconnell  WFU:[id number] DOB: 09/02/1960 DOA: 01/03/2022 PCP: Neale Burly, MD   Brief Narrative:   61 y.o. male with medical history significant of Ogilvie syndrome, HTN, CKD3a, RCC s/p partial right nephrectomy, HTN, schizoaffective disorder presented with generalized weakness, poor PO intake and constipation.  He was admitted with acute on chronic constipation/Ogilvie syndrome.  GI was consulted.  Patient received serial enemas with improvement in abdominal distention.  GI subsequently signed off.  PT recommended SNF placement.  GI reconsulted on 01/11/2022 for persistent abdominal distention with vomiting.  Psychiatry also consulted for medication management.  Due to persistent abdominal distention and vomiting, patient underwent colonoscopy on 01/12/2022.  Assessment & Plan:   Acute on chronic constipation/Ogilvie syndrome causing abdominal distention -Patient required bowel regimen along with intermittent enemas. -Was still having intermittent vomiting with abdominal distention.  GI was reconsulted on 01/11/2022: underwent colonoscopy on 01/12/2022.  Follow further GI recommendations. -Still intermittently vomiting after the procedure.  Generalized weakness -patient lives in a group home and he is on high-dose psychiatric medications, which probably contributed to his weakness.  Apparently, he gets his psychiatric medications adjusted frequently at the group home.  When he gets weak and drowsy they cut down, and when he becomes agitated or not sleeping the doses are increased again.  Seroquel and Haldol decreased on 8/6 due to severe lethargy and he seems to be improved now.   -Psychiatry consulted for medication management.  Schizoaffective disorder/cognitive impairment -Psychiatry evaluation appreciated.  Patient was started on valbenazine by psychiatry.  Currently also on lithium. -Reconsulted psychiatry for increasing tremors: Pending  AKI on CKD stage  IIIa -Baseline creatinine 1.3-1.5.  Creatinine improving to 1.51 today.  DC IV fluids.  Repeat a.m. labs  Hypertension -Blood pressure intermittently on the higher side.  Monitor.  Hypocalcemia/vitamin D deficiency -Continue replacement  History of tremor -There is a 11/09/21 note from family medicine that mentions evaluation with neurology. 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 It looks like no action has been taken on this. -Psychiatry started patient on valbenazine.  Plan as above -Neuro evaluation as an outpatient.  Physical deconditioning -PT now recommends home health PT at group home.  Social worker following   DVT prophylaxis: Lovenox Code Status: Full Family Communication: None at bedside Disposition Plan: Status is: Inpatient Remains inpatient appropriate because: In 1 to 2 days if clinically improves and cleared by GI and psychiatry.  Consultants: GI/psychiatry  Procedures: Colonoscopy on 01/12/2022  Antimicrobials: None   Subjective: Patient seen and examined at bedside.  Poor historian.  Had overnight vomiting again as per nursing staff.  No agitation, seizures, fever reported. Objective: Vitals:   01/13/22 0359 01/13/22 1403 01/13/22 2021 01/14/22 0732  BP: 112/88 (!) 137/96 (!) 154/95 (!) 146/92  Pulse: 83 88 87 82  Resp: '16 16 20 20  '$ Temp: 97.8 F (36.6 C) 98.9 F (37.2 C) 98.9 F (37.2 C) 98.8 F (37.1 C)  TempSrc: Oral Oral Oral Oral  SpO2: 96% 100% 100% 92%  Weight:      Height:        Intake/Output Summary (Last 24 hours) at 01/14/2022 2025 Last data filed at 01/14/2022 0200 Gross per 24 hour  Intake 1920 ml  Output 1800 ml  Net 120 ml    Filed Weights   01/03/22 2214  Weight: 70.3 kg    Examination:  General exam: On room air currently.  No acute distress.  Chronically ill and deconditioned.  Poor historian.  Still extremely slow to respond.  Flat  affect.  Upper extremity and facial tremors noted Respiratory system: Bilateral decreased breath sounds at bases, scattered crackles  cardiovascular system: Rate controlled; S1 and S2 heard  gastrointestinal system: Abdomen is only very minimally distended; soft and nontender.  Bowel sounds are heard  extremities: No cyanosis; trace lower extremity edema present  data Reviewed: I have personally reviewed following labs and imaging studies  CBC: Recent Labs  Lab 01/09/22 0449 01/10/22 0503 01/12/22 0629  WBC 7.5 8.7 10.1  NEUTROABS  --   --  6.2  HGB 13.7 14.6 13.9  HCT 42.7 46.2 43.1  MCV 92.6 94.3 92.7  PLT 254 295 389    Basic Metabolic Panel: Recent Labs  Lab 01/09/22 0449 01/10/22 0503 01/12/22 0629 01/13/22 0519 01/14/22 0545  NA 137 140 142 141 143  K 5.2* 5.0 4.4 4.3 4.1  CL 105 105 107 106 110  CO2 '22 24 26 26 25  '$ GLUCOSE 108* 100* 104* 90 94  BUN 23 27* 29* 27* 22  CREATININE 1.47* 1.43* 1.79* 1.52* 1.51*  CALCIUM 9.2 9.5 9.2 8.7* 8.6*  MG  --   --  3.1* 2.7*  --     GFR: Estimated Creatinine Clearance: 49.1 mL/min (A) (by C-G formula based on SCr of 1.51 mg/dL (H)). Liver Function Tests: Recent Labs  Lab 01/12/22 0629  AST 23  ALT 28  ALKPHOS 102  BILITOT 0.6  PROT 7.0  ALBUMIN 3.3*    Recent Labs  Lab 01/12/22 0629  LIPASE 68*    No results for input(s): "AMMONIA" in the last 168 hours. Coagulation Profile: No results for input(s): "INR", "PROTIME" in the last 168 hours. Cardiac Enzymes: No results for input(s): "CKTOTAL", "CKMB", "CKMBINDEX", "TROPONINI" in the last 168 hours. BNP (last 3 results) No results for input(s): "PROBNP" in the last 8760 hours. HbA1C: No results for input(s): "HGBA1C" in the last 72 hours. CBG: Recent Labs  Lab 01/08/22 1219  GLUCAP 57*    Lipid Profile: No results for input(s): "CHOL", "HDL", "LDLCALC", "TRIG", "CHOLHDL", "LDLDIRECT" in the last 72 hours. Thyroid Function Tests: No results for  input(s): "TSH", "T4TOTAL", "FREET4", "T3FREE", "THYROIDAB" in the last 72 hours. Anemia Panel: No results for input(s): "VITAMINB12", "FOLATE", "FERRITIN", "TIBC", "IRON", "RETICCTPCT" in the last 72 hours. Sepsis Labs: No results for input(s): "PROCALCITON", "LATICACIDVEN" in the last 168 hours.  No results found for this or any previous visit (from the past 240 hour(s)).       Radiology Studies: CT ABDOMEN PELVIS WO CONTRAST  Result Date: 01/12/2022 CLINICAL DATA:  Weakness and fatigue. Constipation. Suspicion for bowel obstruction. EXAM: CT ABDOMEN AND PELVIS WITHOUT CONTRAST TECHNIQUE: Multidetector CT imaging of the abdomen and pelvis was performed following the standard protocol without IV contrast. RADIATION DOSE REDUCTION: This exam was performed according to the departmental dose-optimization program which includes automated exposure control, adjustment of the mA and/or kV according to patient size and/or use of iterative reconstruction technique. COMPARISON:  01/03/2022 FINDINGS: Study mildly degraded by patient motion. Lower chest: Small right and trace left pleural effusions, new. Mild dependent lower lobe atelectasis, increased from the prior exam. Hepatobiliary: No focal liver abnormality is seen. No gallstones, gallbladder wall thickening, or biliary dilatation. Pancreas: Unremarkable. No pancreatic ductal dilatation or surrounding inflammatory changes. Spleen: Normal in size without focal abnormality. Adrenals/Urinary Tract: No adrenal masses. No renal mass, stone or hydronephrosis. Normal ureters. Normal bladder. Stomach/Bowel: Dilated  sigmoid and right colon sigmoid colon maximum of 6.8 cm right colon maximum of 6.5 cm. No colonic wall thickening. No inflammation. Small bowel is normal in caliber. Unremarkable stomach. Multiple surgical vascular clips are noted in left upper quadrant, stable. No significant increase in the colonic stool burden. Vascular/Lymphatic: No significant  vascular findings are present. No enlarged abdominal or pelvic lymph nodes. Reproductive: Unremarkable. Other: No abdominal wall hernia or abnormality. No abdominopelvic ascites. Musculoskeletal: No fracture or acute finding.  No bone lesion. IMPRESSION: 1. There is been significant interval improvement. Colonic dilation has decreased from the prior study and there has been a significant decrease in the colonic stool burden. 2. No evidence of bowel obstruction or inflammation. 3. Small right and trace left pleural effusions with associated basilar atelectasis, effusions new, atelectasis increased from the prior study. Electronically Signed   By: Lajean Manes M.D.   On: 01/12/2022 14:40   DG Abd 1 View  Result Date: 01/12/2022 CLINICAL DATA:  [id number]; follow up ileus EXAM: ABDOMEN - 1 VIEW COMPARISON:  Abdominal x-ray from yesterday FINDINGS: Again seen is the severe diffuse gaseous distention of the colon with a gaseous distended transverse colon measuring up to 10 cm and has mildly increased in the interim. Multiple surgical clips in the left abdomen. IMPRESSION: Severe diffuse gaseous distention of the colon and has mildly increased in the interim. Findings may be on the basis of ileus. Continued follow-up is suggested. Electronically Signed   By: Frazier [name] M.D.   On: 01/12/2022 08:54        Scheduled Meds:  divalproex  1,000 mg Oral QHS   enoxaparin (LOVENOX) injection  40 mg Subcutaneous Q24H   feeding supplement  1 Container Oral TID BM   haloperidol  2 mg Oral TID   lactulose  30 g Oral TID   lithium carbonate  150 mg Oral Daily   lithium carbonate  300 mg Oral QHS   polyethylene glycol  17 g Oral BID   QUEtiapine  100 mg Oral QHS   senna-docusate  2 tablet Oral BID   sorbitol, milk of mag, mineral oil, glycerin (SMOG) enema  960 mL Rectal Once   valbenazine  40 mg Oral Daily   Vitamin D (Ergocalciferol)  50,000 Units Oral Q7 days   zolpidem  10 mg Oral QHS   Continuous  Infusions:  sodium chloride 75 mL/hr at 01/13/22 1817          Aline August, MD [facility] 01/14/2022, 8:22 AM

## 2022-01-15 DIAGNOSIS — K5981 Ogilvie syndrome: Secondary | ICD-10-CM | POA: Diagnosis not present

## 2022-01-15 DIAGNOSIS — R531 Weakness: Secondary | ICD-10-CM | POA: Diagnosis not present

## 2022-01-15 DIAGNOSIS — K5900 Constipation, unspecified: Secondary | ICD-10-CM | POA: Diagnosis not present

## 2022-01-15 DIAGNOSIS — Z8669 Personal history of other diseases of the nervous system and sense organs: Secondary | ICD-10-CM | POA: Diagnosis not present

## 2022-01-15 NOTE — Consult Note (Cosign Needed Addendum)
[facility] Consult   Reason for Consult:  Patient on multiple meds; doses recently reduced. ? Contributing to weakness/tremors.  Referring Physician:  Dr. Starla Link Patient Identification: Nicholas Oconnell MRN:  [id number] Principal Diagnosis: Abdominal distention Diagnosis:  Principal Problem:   Abdominal distention Active Problems:   CKD (chronic kidney disease), stage III (HCC)   Schizoaffective disorder, bipolar type (HCC)   HTN (hypertension)   Hypocalcemia   Elevated alkaline phosphatase level   Generalized weakness   History of tremor   Constipation   Ogilvie syndrome   Total Time spent with patient: 45 minutes  Subjective:   Nicholas Oconnell is a 61 y.o. male patient admitted from a group home with generalized weakness, poor PO intake and constipation and admitted 01/03/22 for Acute on chronic constipation, Ogilvie syndrome, abdominal distention. Past medical history significant of Ogilvie syndrome, HTN, CKD3a, RCC s/p partial right nephrectomy, HTN, schizoaffective disorder .  Nicholas Oconnell is a 61 year old male with history of bipolar affective disorder, schizoaffective disorder, borderline intellectual functioning, drug induced parkinsonian and tremors.  Patient admits to longstanding/chronic history of psychotropic medication, for management of schizoaffective disorder.  He currently resides in a group home, reports compliance with outpatient psych and neurology.  Psych consult was placed for tremors?  Weakness, medications reduce knee recommendations.  Patient is on multiple medication for his mood and behavioral concerns to include Depakote, quetiapine, Haldol, and lithium. The above medications have been adjusted during this hospitalization due to concerns noted above prior to psych consult being placed.  Patient does have a legal guardian in place.   On further examination today, patient presents with improved affect as noted by his jovial mood, pleasant conversation, and  willingness to participate in all treatments of care.  Patient does report modest improvement in his tremors, and is excited to be able to do the things he has not done in a long time.  As a result patient reports better improvement with his gait and upper body strength when completing physical therapy sessions today.  He also is able to hold a cup in his hand without shaking.  While he continues to have tremors at baseline, suspect will help tolerate his tardive dyskinesia symptoms. He denies any changes to his mood and or physical concerns.  At this present time of psychiatric reevaluation, he remains currently stable on current medication regimen.  Patient was on very high doses of multiple psychiatric medications, which were reduced prior to this consult be in place.  Attempted to call the group home manager and legal guardian, both were unsuccessful.  Patient has not displayed any disruptive behaviors, aggression, agitation.  Furthermore he does not present with any acute psychosis and/or psychiatric symptoms, that warrants ongoing inpatient psychiatric evaluations.  He does not present with delusional thought disorder; and or seem to be responding to internal stimuli or external stimuli.   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  He denies any suicidal ideations, homicidal ideations, and or auditory or visual hallucinations.    HPI:   61 y.o. male with medical history significant of Ogilvie syndrome, HTN, CKD3a, RCC s/p partial right nephrectomy, HTN, schizoaffective disorder. Presenting with generalized weakness, poor PO intake and constipation. History is from caregiver at bedside. He reports that the patient has 5 days of weakness and poor appetite. During this time, he has not had any fevers, urinary changes, or vomiting note.  Past Psychiatric History: Schizoaffective disorder, and borderline intellectual  disorder.   Current medications:Lithium, Depakote, Seroquel, Haldol Previous psychiatric medications:, Propranolol, Ambien, Clozaril, paliperidone Patient is being managed by her outpatient psychiatrist Dr. Helene Kelp, and Dr. Jetta Lout at [facility] for drug-induced parkinsonianism and tremors.   Risk to Self:  Denies Risk to Others:  Denies Prior Inpatient Therapy:  Denies Prior Outpatient Therapy:  Denies  Past Medical History:  Past Medical History:  Diagnosis Date   Anxiety    Bipolar affective ([place])    Cancer ([place])    Chronic kidney disease (CKD)    Cognitive deficits    Hypertension    Seizure [facility])     Past Surgical History:  Procedure Laterality Date   COLONOSCOPY N/A 01/12/2022   Procedure: COLONOSCOPY;  Surgeon: Carol Ada, MD;  Location: [facility];  Service: Gastroenterology;  Laterality: N/A;   Family History: History reviewed. No pertinent family history. Family Psychiatric  History: Denies per sister/LG Social History:  Social History   Substance and Sexual Activity  Alcohol Use Never     Social History   Substance and Sexual Activity  Drug Use Never    Social History   Socioeconomic History   Marital status: Single    Spouse name: Not on file   Number of children: Not on file   Years of education: Not on file   Highest education level: Not on file  Occupational History   Not on file  Tobacco Use   Smoking status: Never   Smokeless tobacco: Never  Vaping Use   Vaping Use: Never used  Substance and Sexual Activity   Alcohol use: Never   Drug use: Never   Sexual activity: Not on file  Other Topics Concern   Not on file  Social History Narrative   Not on file   Social Determinants of Health   Financial Resource Strain: Not on file  Food Insecurity: Not on file  Transportation Needs: Not on file  Physical Activity: Not on file  Stress: Not on file  Social Connections: Not on file   Additional Social History: Specify valuables  returned: clothes and shoes  Allergies:   Allergies  Allergen Reactions   Benzodiazepines Other (See Comments)    disinhibition Not on MAR   Clozapine Other (See Comments)    constipation - bowel obstruction Not on MAR    Labs:  Results for orders placed or performed during the hospital encounter of 01/03/22 (from the past 48 hour(s))  Basic metabolic panel     Status: Abnormal   Collection Time: 01/14/22  5:45 AM  Result Value Ref Range   Sodium 143 135 - 145 mmol/L   Potassium 4.1 3.5 - 5.1 mmol/L   Chloride 110 98 - 111 mmol/L   CO2 25 22 - 32 mmol/L   Glucose, Bld 94 70 - 99 mg/dL    Comment: Glucose reference range applies only to samples taken after fasting for at least 8 hours.   BUN 22 8 - 23 mg/dL   Creatinine, Ser 1.51 (H) 0.61 - 1.24 mg/dL   Calcium 8.6 (L) 8.9 - 10.3 mg/dL   GFR, Estimated 52 (L) >60 mL/min    Comment: (NOTE) Calculated using the CKD-EPI Creatinine Equation (2021)    Anion gap 8 5 - 15    Comment: Performed at [facility], [place] [address], [place], [place] [address]    Current Facility-Administered Medications  Medication Dose Route Frequency Provider Last Rate Last Admin   alum & mag hydroxide-simeth (MAALOX/MYLANTA) 200-200-20 MG/5ML  suspension 30 mL  30 mL Oral Q6H PRN Carol Ada, MD   30 mL at 01/07/22 1620   divalproex (DEPAKOTE ER) 24 hr tablet 1,000 mg  1,000 mg Oral QHS Carol Ada, MD   1,000 mg at 01/14/22 2111   enoxaparin (LOVENOX) injection 40 mg  40 mg Subcutaneous Q24H Carol Ada, MD   40 mg at 01/14/22 1835   feeding supplement (BOOST / RESOURCE BREEZE) liquid 1 Container  1 Container Oral TID BM Carol Ada, MD   1 Container at 01/15/22 1051   haloperidol (HALDOL) tablet 2 mg  2 mg Oral TID Carol Ada, MD   2 mg at 01/15/22 1047   hydrALAZINE (APRESOLINE) injection 10 mg  10 mg Intravenous Q8H PRN Carol Ada, MD       lactulose (CHRONULAC) 10 GM/15ML solution 30 g  30 g Oral TID Carol Ada,  MD   30 g at 01/15/22 1051   lithium carbonate capsule 150 mg  150 mg Oral Daily Carol Ada, MD   150 mg at 01/15/22 1047   lithium carbonate capsule 300 mg  300 mg Oral QHS Carol Ada, MD   300 mg at 01/14/22 2110   ondansetron (ZOFRAN) injection 4 mg  4 mg Intravenous Q8H PRN Carol Ada, MD   4 mg at 01/14/22 2107   polyethylene glycol (MIRALAX / GLYCOLAX) packet 17 g  17 g Oral BID Carol Ada, MD   17 g at 01/15/22 1051   QUEtiapine (SEROQUEL) tablet 100 mg  100 mg Oral QHS Carol Ada, MD   100 mg at 01/14/22 2111   senna-docusate (Senokot-S) tablet 2 tablet  2 tablet Oral BID Carol Ada, MD   2 tablet at 01/15/22 1051   sorbitol, milk of mag, mineral oil, glycerin (SMOG) enema  960 mL Rectal Once Carol Ada, MD       valbenazine [facility]) capsule 40 mg  40 mg Oral Daily Carol Ada, MD   40 mg at 01/15/22 1047   Vitamin D (Ergocalciferol) (DRISDOL) 1.25 MG (50000 UNIT) capsule 50,000 Units  50,000 Units Oral Q7 days Carol Ada, MD   50,000 Units at 01/11/22 1616   zolpidem (AMBIEN) tablet 10 mg  10 mg Oral QHS Carol Ada, MD   10 mg at 01/14/22 2118    Musculoskeletal: Strength & Muscle Tone: within normal limits Gait & Station: normal Patient leans: N/A   Psychiatric Specialty Exam:  Presentation  General Appearance: Appropriate for Environment; Casual  Eye Contact:Good  Speech:Clear and Coherent; Slow  Speech Volume:Normal  Handedness:Right   Mood and Affect  Mood:Euthymic  Affect:Appropriate   Thought Process  Thought Processes:Linear; Coherent  Descriptions of Associations:Intact  Orientation:Full (Time, Place and Person)  Thought Content:Logical  History of Schizophrenia/Schizoaffective disorder:No data recorded  Psychomotor Activity  Psychomotor Activity:No data recorded  Assets  Assets:Communication Skills; Physical Health; Financial Resources/Insurance   Sleep  Sleep:No data recorded   Physical Exam Vitals and  nursing note reviewed.  Constitutional:      Appearance: Normal appearance. He is normal weight.  Skin:    General: Skin is warm and dry.     Capillary Refill: Capillary refill takes less than 2 seconds.  Neurological:     Mental Status: He is alert and oriented to person, place, and time. Mental status is at baseline.     Motor: Tremor (present at rest) present.     Comments: Cogwheeling throughout extremities  Psychiatric:        Mood and Affect: Mood  normal.        Behavior: Behavior normal.        Thought Content: Thought content normal.        Judgment: Judgment normal.    ROS Blood pressure (!) 160/116, pulse 93, temperature 98.6 F (37 C), resp. rate 18, height '5\' 8"'$  (1.727 m), weight 70.3 kg, SpO2 (!) 87 %. Body mass index is 23.57 kg/m.  Treatment Plan Summary: Medication management and Plan   Schizoaffective: Will continue current medications at this time.  -Will continue Ingrezza '40mg'$  po daily, effective medication for reduction in tremor secondary to psychotropic medication.  Will attempt to discharge with 7-day medication supply of Ingrezza, for continuity of care.  Will update AVS to reflect need for appointment within 7 days to continue medication. - Patient last seen at [facility] on June 8 for drug-induced parkinsonian symptoms. Continue outpatient recommendations.  -Depakote level obtained on 08/10, 38.     Tremors: Continue Ingrezza '40mg'$  po daily. Order placed for [facility] samples. Accurate diagnosis will drive the selection of the correct treatment for this patient  Drug-induced Parkinsonisms and tardive dyskinesia are very similar.  Additional testing can be done in an outpatient setting to determine the best course of treatment and action.  However modifications have been initiated to include reduction of dose and psychotropic medication, which may improve drug-induced parkinsonianism although tardive dyskinesia is considered prominent in most cases.  While  Sinemet is an option, patient will benefit from additional testing to determine actual condition whether drug-induced or tardive dyskinesia.  There are new reversible monoamine two transport inhibitors such as Ingrezza, which patient has showed modest improvement since initiation of this medication.  Going forward goal will be to reduce the impact of these adverse events on this patient's quality of life by limiting use of multiple antipsychotics and avoiding large doses.  Psychiatry will sign off at this time.  Disposition: No evidence of imminent risk to self or others at present.   Patient does not meet criteria for psychiatric inpatient admission. Supportive therapy provided about ongoing stressors. Discussed crisis plan, support from social network, calling 911, coming to the Emergency Department, and calling Suicide Hotline.  Suella Broad, FNP 01/15/2022 3:23 PM

## 2022-01-15 NOTE — Progress Notes (Signed)
Physical Therapy Treatment Patient Details Name: Nicholas Oconnell MRN: [id number] DOB: 04/28/1961 Today's Date: 01/15/2022   History of Present Illness 61 y.o. male presenting from a group home with generalized weakness, poor PO intake and constipation and admitted 01/03/22 for Acute on chronic constipation, Ogilvie syndrome, abdominal distention. Past medical history significant of Ogilvie syndrome, HTN, CKD3a, RCC s/p partial right nephrectomy, HTN, schizoaffective disorder.    PT Comments    Pt eager to mobilize this am. Incr gait distance however remains limited (requiring min to mod assist with gait) d/t significant tremors, ataxia and festinating gait.  Deviations exacerbated by fatigue. Will continue to follow in acute setting and progress as able.   Recommendations for follow up therapy are one component of a multi-disciplinary discharge planning process, led by the attending physician.  Recommendations may be updated based on patient status, additional functional criteria and insurance authorization.  Follow Up Recommendations  Home health PT (at [facility]) Can patient physically be transported by private vehicle: No   Assistance Recommended at Discharge Frequent or constant Supervision/Assistance  Patient can return home with the following A little help with walking and/or transfers;A little help with bathing/dressing/bathroom   Equipment Recommendations  None recommended by PT    Recommendations for Other Services       Precautions / Restrictions Precautions Precautions: Fall Restrictions Weight Bearing Restrictions: No     Mobility  Bed Mobility Overal bed mobility: Needs Assistance Bed Mobility: Supine to Sit     Supine to sit: Min guard     General bed mobility comments: step by step verbal cues to complete task, min/guard to safely elevate trunk and Lower feet to floor    Transfers Overall transfer level: Needs assistance Equipment used: None, Rolling walker  (2 wheels) Transfers: Sit to/from Stand Sit to Stand: Min assist, Min guard           General transfer comment: Min assist to min/guard to steady with power up from EOB and recliner. cues for sequencing to use bil UE from EOB or armrest to initiate rise, uses momentum to come to stand. hand over hand to reach back to chair, assist to control descent    Ambulation/Gait Ambulation/Gait assistance: Min assist, Mod assist   Assistive device: Rolling walker (2 wheels), 1 person hand held assist Gait Pattern/deviations: Trunk flexed, Narrow base of support, Ataxic, Knee flexed in stance - right, Knee flexed in stance - left, Step-to pattern, Step-through pattern Gait velocity: decr     General Gait Details: Min assist for balance and control RW position and gait speed- Mod assist last 20' as pt with incr bil knee flexion, incr festinating gait as well as worsening tremors and ataxia with fatigue.   Stairs             Wheelchair Mobility    Modified Rankin (Stroke Patients Only)       Balance Overall balance assessment: Needs assistance Sitting-balance support: Bilateral upper extremity supported, Feet supported Sitting balance-Leahy Scale: Fair     Standing balance support: Bilateral upper extremity supported, During functional activity, Reliant on assistive device for balance, Single extremity supported Standing balance-Leahy Scale: Poor Standing balance comment: reliant on external assist for dynamic acitivities. very briefly able to maintian static stand without support                            Cognition Arousal/Alertness: Awake/alert Behavior During Therapy: WFL for tasks assessed/performed Overall Cognitive Status:  History of cognitive impairments - at baseline                                 General Comments: pleasant, cooperative, and eager to mobilize.        Exercises      General Comments        Pertinent Vitals/Pain  Pain Assessment Pain Assessment: No/denies pain    Home Living                          Prior Function            PT Goals (current goals can now be found in the care plan section) Acute Rehab PT Goals PT Goal Formulation: Patient unable to participate in goal setting Time For Goal Achievement: 01/18/22 Potential to Achieve Goals: Fair Progress towards PT goals: Progressing toward goals    Frequency    Min 2X/week      PT Plan Current plan remains appropriate    Co-evaluation              AM-PAC PT "6 Clicks" Mobility   Outcome Measure  Help needed turning from your back to your side while in a flat bed without using bedrails?: A Little Help needed moving from lying on your back to sitting on the side of a flat bed without using bedrails?: A Little Help needed moving to and from a bed to a chair (including a wheelchair)?: A Little Help needed standing up from a chair using your arms (e.g., wheelchair or bedside chair)?: A Little Help needed to walk in hospital room?: A Lot Help needed climbing 3-5 steps with a railing? : A Lot 6 Click Score: 16    End of Session Equipment Utilized During Treatment: Gait belt Activity Tolerance: Patient tolerated treatment well Patient left: in chair;with call bell/phone within reach;with chair alarm set Nurse Communication: Mobility status PT Visit Diagnosis: Muscle weakness (generalized) (M62.81);Other abnormalities of gait and mobility (R26.89);History of falling (Z91.81)     Time: 1030-1049 PT Time Calculation (min) (ACUTE ONLY): 19 min  Charges:  $Gait Training: 8-22 mins                     Baxter Flattery, PT  Acute Rehab Dept [facility]) [phone]  [facility] Weekend Pager [facility] only)  [phone]  01/15/2022    [facility] 01/15/2022, 11:26 AM

## 2022-01-15 NOTE — Progress Notes (Signed)
PROGRESS NOTE    Oswell Say  XLK:[id number] DOB: 13-Jan-1961 DOA: 01/03/2022 PCP: Neale Burly, MD   Brief Narrative:   61 y.o. male with medical history significant of Ogilvie syndrome, HTN, CKD3a, RCC s/p partial right nephrectomy, HTN, schizoaffective disorder presented with generalized weakness, poor PO intake and constipation.  He was admitted with acute on chronic constipation/Ogilvie syndrome.  GI was consulted.  Patient received serial enemas with improvement in abdominal distention.  GI subsequently signed off.  PT recommended SNF placement.  GI reconsulted on 01/11/2022 for persistent abdominal distention with vomiting.  Psychiatry also consulted for medication management.  Due to persistent abdominal distention and vomiting, patient underwent colonoscopy on 01/12/2022.  Assessment & Plan:   Acute on chronic constipation/Ogilvie syndrome causing abdominal distention -Patient required bowel regimen along with intermittent enemas. -Was still having intermittent vomiting with abdominal distention.  GI was reconsulted on 01/11/2022: underwent colonoscopy on 01/12/2022.  Follow further GI recommendations. -Still intermittently vomiting after the procedure.  Generalized weakness -patient lives in a group home and he is on high-dose psychiatric medications, which probably contributed to his weakness.  Apparently, he gets his psychiatric medications adjusted frequently at the group home.  When he gets weak and drowsy they cut down, and when he becomes agitated or not sleeping the doses are increased again.  Seroquel and Haldol decreased on 8/6 due to severe lethargy and he seems to be improved now.   -Psychiatry consulted for medication management.  Schizoaffective disorder/cognitive impairment -Psychiatry evaluation appreciated.  Patient was started on valbenazine by psychiatry.  Currently also on lithium. -Reconsulted psychiatry for increasing tremors: Still pending  AKI on CKD  stage IIIa -Baseline creatinine 1.3-1.5.  Creatinine improving to 1.51 today.  DC IV fluids.  Repeat a.m. labs.   Hypertension -Blood pressure intermittently on the higher side.  Monitor.  Hypocalcemia/vitamin D deficiency -Continue replacement  History of tremor -There is a 11/09/21 note from family medicine that mentions evaluation with neurology. 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 It looks like no action has been taken on this. -Psychiatry started patient on valbenazine.  Plan as above -Neuro evaluation as an outpatient.  Physical deconditioning -PT now recommends home health PT at group home.  Social worker following   DVT prophylaxis: Lovenox Code Status: Full Family Communication: None at bedside Disposition Plan: Status is: Inpatient Remains inpatient appropriate because: Of severity of illness.  Psychiatry evaluation pending.  Will need GI clearance as well.  Discharge to group home tomorrow if remains stable and creatinine also remains stable off of IV fluids.  Consultants: GI/psychiatry  Procedures: Colonoscopy on 01/12/2022  Antimicrobials: None   Subjective: Patient seen and examined at bedside.  Poor historian.  No overnight vomiting reported by nursing staff.  Apparently had a bowel movement this morning as per nursing staff.  No fever or seizures reported.   Objective: Vitals:   01/14/22 0732 01/14/22 1159 01/14/22 2018 01/15/22 0458  BP: (!) 146/92 (!) 137/93 134/81 (!) 130/91  Pulse: 82 86 74 80  Resp: '20  18 17  '$ Temp: 98.8 F (37.1 C) 98.7 F (37.1 C) 99.6 F (37.6 C) 98.6 F (37 C)  TempSrc: Oral Oral    SpO2: 92% 93% 96% 96%  Weight:      Height:        Intake/Output Summary (Last 24 hours) at 01/15/2022 1123 Last data filed at 01/15/2022 0300 Gross per 24 hour  Intake 1857 ml  Output  1150 ml  Net 707 ml    Filed Weights   01/03/22 2214  Weight: 70.3 kg     Examination:  General exam: No distress.  Still on room air.  Chronically ill and deconditioned.  Poor historian.  Still extremely slow to respond.  Flat affect.  Mild upper extremity and facial tremors noted Respiratory system: Decreased breath sounds at bases bilaterally, no wheezing cardiovascular system: S1-S2 heard; rate controlled currently  gastrointestinal system: Abdomen is slightly distended; soft and nontender.  Normal bowel sounds heard extremities: No clubbing; mild lower extremity edema present   data Reviewed: I have personally reviewed following labs and imaging studies  CBC: Recent Labs  Lab 01/09/22 0449 01/10/22 0503 01/12/22 0629  WBC 7.5 8.7 10.1  NEUTROABS  --   --  6.2  HGB 13.7 14.6 13.9  HCT 42.7 46.2 43.1  MCV 92.6 94.3 92.7  PLT 254 295 703    Basic Metabolic Panel: Recent Labs  Lab 01/09/22 0449 01/10/22 0503 01/12/22 0629 01/13/22 0519 01/14/22 0545  NA 137 140 142 141 143  K 5.2* 5.0 4.4 4.3 4.1  CL 105 105 107 106 110  CO2 '22 24 26 26 25  '$ GLUCOSE 108* 100* 104* 90 94  BUN 23 27* 29* 27* 22  CREATININE 1.47* 1.43* 1.79* 1.52* 1.51*  CALCIUM 9.2 9.5 9.2 8.7* 8.6*  MG  --   --  3.1* 2.7*  --     GFR: Estimated Creatinine Clearance: 49.1 mL/min (A) (by C-G formula based on SCr of 1.51 mg/dL (H)). Liver Function Tests: Recent Labs  Lab 01/12/22 0629  AST 23  ALT 28  ALKPHOS 102  BILITOT 0.6  PROT 7.0  ALBUMIN 3.3*    Recent Labs  Lab 01/12/22 0629  LIPASE 68*    No results for input(s): "AMMONIA" in the last 168 hours. Coagulation Profile: No results for input(s): "INR", "PROTIME" in the last 168 hours. Cardiac Enzymes: No results for input(s): "CKTOTAL", "CKMB", "CKMBINDEX", "TROPONINI" in the last 168 hours. BNP (last 3 results) No results for input(s): "PROBNP" in the last 8760 hours. HbA1C: No results for input(s): "HGBA1C" in the last 72 hours. CBG: Recent Labs  Lab 01/08/22 1219  GLUCAP 57*    Lipid  Profile: No results for input(s): "CHOL", "HDL", "LDLCALC", "TRIG", "CHOLHDL", "LDLDIRECT" in the last 72 hours. Thyroid Function Tests: No results for input(s): "TSH", "T4TOTAL", "FREET4", "T3FREE", "THYROIDAB" in the last 72 hours. Anemia Panel: No results for input(s): "VITAMINB12", "FOLATE", "FERRITIN", "TIBC", "IRON", "RETICCTPCT" in the last 72 hours. Sepsis Labs: No results for input(s): "PROCALCITON", "LATICACIDVEN" in the last 168 hours.  No results found for this or any previous visit (from the past 240 hour(s)).       Radiology Studies: No results found.      Scheduled Meds:  divalproex  1,000 mg Oral QHS   enoxaparin (LOVENOX) injection  40 mg Subcutaneous Q24H   feeding supplement  1 Container Oral TID BM   haloperidol  2 mg Oral TID   lactulose  30 g Oral TID   lithium carbonate  150 mg Oral Daily   lithium carbonate  300 mg Oral QHS   polyethylene glycol  17 g Oral BID   QUEtiapine  100 mg Oral QHS   senna-docusate  2 tablet Oral BID   sorbitol, milk of mag, mineral oil, glycerin (SMOG) enema  960 mL Rectal Once   valbenazine  40 mg Oral Daily   Vitamin D (Ergocalciferol)  50,000  Units Oral Q7 days   zolpidem  10 mg Oral QHS   Continuous Infusions:         Aline August, MD [facility] 01/15/2022, 11:23 AM

## 2022-01-15 NOTE — Progress Notes (Addendum)
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 Currently he seems to be at his baseline seems to be very comfortable and denies any abdominal pain.  Objective: Vital signs in last 24 hours: Temp:  [98.6 F (37 C)-99.6 F (37.6 C)] 98.6 F (37 C) (08/14 0458) Pulse Rate:  [74-93] 93 (08/14 1408) Resp:  [17-18] 18 (08/14 1408) BP: (130-160)/(81-116) 160/116 (08/14 1408) SpO2:  [87 %-96 %] 87 % (08/14 1408) Last BM Date : 01/15/22  Intake/Output from previous day: 08/13 0701 - 08/14 0700 In: 1977 [P.O.:460; I.V.:1517] Out: 1200 [Urine:1150; Emesis/NG output:50] Intake/Output this shift: No intake/output data recorded.  General appearance: alert, cooperative, appears stated age, and no distress Resp: clear to auscultation bilaterally Cardio: regular rate and rhythm, S1, S2 normal, no murmur, click, rub or gallop GI: soft, non-tender; bowel sounds normal; no masses,  no organomegaly  Lab Results: No results for input(s): "WBC", "HGB", "HCT", "PLT" in the last 72 hours. BMET Recent Labs    01/13/22 0519 01/14/22 0545  NA 141 143  K 4.3 4.1  CL 106 110  CO2 26 25  GLUCOSE 90 94  BUN 27* 22  CREATININE 1.52* 1.51*  CALCIUM 8.7* 8.6*    Studies/Results: No results found.  Medications: I have reviewed the patient's current medications. Prior to Admission:  Medications Prior to Admission  Medication Sig Dispense Refill Last Dose   aMILoride (MIDAMOR) 5 MG tablet Take 5 mg by mouth daily.   01/03/2022   divalproex (DEPAKOTE ER) 500 MG 24 hr tablet Take 2 tablets (1,000 mg total) by mouth at bedtime. 60 tablet 0 01/02/2022   Ensure (ENSURE)  Take by mouth 2 (two) times daily as needed (if 50% of meal not consumed).   unk   haloperidol (HALDOL) 20 MG tablet Take 20 mg by mouth 3 (three) times daily.   01/03/2022   lithium carbonate 150 MG capsule Take 150-300 mg by mouth See admin instructions. Take 150 mg by mouth every morning. Take 300 mg by mouth at bedtime.   01/03/2022   Melatonin 10 MG CAPS Take 10 mg by mouth at bedtime.   01/02/2022   pantoprazole (PROTONIX) 40 MG tablet Take 40 mg by mouth 2 (two) times daily.   01/03/2022   Polyethylene Glycol 3350 POWD Take 17 g by mouth in the morning and at bedtime.   01/03/2022   propranolol (INDERAL) 10 MG tablet Take 10 mg by mouth 3 (three) times daily.   01/03/2022 at 0800   QUEtiapine (SEROQUEL) 400 MG tablet Take 400 mg by mouth 2 (two) times daily.   01/03/2022   simethicone (MYLICON) 476 MG chewable tablet Chew 125 mg by mouth every 6 (six) hours as needed for flatulence.   unk   Sodium Phosphates (ENEMA) ENEM Place 1 Dose rectally daily as needed (constipation).   unk   VELTASSA 8.4 g packet Take 8.4 g by mouth daily.   01/03/2022   zolpidem (AMBIEN) 10 MG tablet Take 10 mg by mouth at bedtime.   01/02/2022   [DISCONTINUED] lactulose (CHRONULAC) 10 GM/15ML solution Take 20 g by mouth 3 (three) times daily as needed for constipation.   unk   [DISCONTINUED] senna-docusate (SENNA S) 8.6-50 MG tablet Take 2 tablets by mouth daily.  01/03/2022   amLODipine (NORVASC) 10 MG tablet Take 10 mg by mouth daily. (Patient not taking: Reported on 01/03/2022)   Not Taking   mineral oil enema Place 1 enema rectally daily as needed for severe constipation. (Patient not taking: Reported on 01/03/2022)   Not Taking   polyethylene glycol (MIRALAX / GLYCOLAX) 17 g packet Take 17 g by mouth 2 (two) times daily. (Patient not taking: Reported on 01/03/2022)   Not Taking   Scheduled:  divalproex  1,000 mg Oral QHS   enoxaparin (LOVENOX) injection  40 mg Subcutaneous Q24H   feeding supplement  1 Container Oral TID BM    haloperidol  2 mg Oral TID   lactulose  30 g Oral TID   lithium carbonate  150 mg Oral Daily   lithium carbonate  300 mg Oral QHS   polyethylene glycol  17 g Oral BID   QUEtiapine  100 mg Oral QHS   senna-docusate  2 tablet Oral BID   sorbitol, milk of mag, mineral oil, glycerin (SMOG) enema  960 mL Rectal Once   valbenazine  40 mg Oral Daily   Vitamin D (Ergocalciferol)  50,000 Units Oral Q7 days   zolpidem  10 mg Oral QHS   Continuous:  Assessment/Plan: 1) Chronic constipation/Ogilvie syndrome-continue present bowel regimen.  There is not much more that can be done from a GI standpoint symptomatic. 2) Schizoaffective disorder with cognitive impairment and history of tremor. 3) CKD stage 3a. 4) HTN.  LOS: 11 days   Juanita Craver 01/15/2022, 5:55 PM

## 2022-01-15 NOTE — Plan of Care (Signed)

## 2022-01-16 ENCOUNTER — Other Ambulatory Visit (HOSPITAL_COMMUNITY): Payer: Self-pay

## 2022-01-16 ENCOUNTER — Telehealth (HOSPITAL_COMMUNITY): Payer: Self-pay | Admitting: Pharmacy Technician

## 2022-01-16 DIAGNOSIS — K5981 Ogilvie syndrome: Secondary | ICD-10-CM | POA: Diagnosis not present

## 2022-01-16 DIAGNOSIS — K5900 Constipation, unspecified: Secondary | ICD-10-CM | POA: Diagnosis not present

## 2022-01-16 DIAGNOSIS — Z8669 Personal history of other diseases of the nervous system and sense organs: Secondary | ICD-10-CM | POA: Diagnosis not present

## 2022-01-16 DIAGNOSIS — R531 Weakness: Secondary | ICD-10-CM | POA: Diagnosis not present

## 2022-01-16 LAB — BASIC METABOLIC PANEL
Anion gap: 4 — ABNORMAL LOW (ref 5–15)
BUN: 13 mg/dL (ref 8–23)
CO2: 26 mmol/L (ref 22–32)
Calcium: 8.7 mg/dL — ABNORMAL LOW (ref 8.9–10.3)
Chloride: 117 mmol/L — ABNORMAL HIGH (ref 98–111)
Creatinine, Ser: 1.26 mg/dL — ABNORMAL HIGH (ref 0.61–1.24)
GFR, Estimated: >60 mL/min (ref >60)
Glucose, Bld: 84 mg/dL (ref 70–99)
Potassium: 3.8 mmol/L (ref 3.5–5.1)
Sodium: 147 mmol/L — ABNORMAL HIGH (ref 135–145)

## 2022-01-16 LAB — MAGNESIUM: Magnesium: 2.6 mg/dL — ABNORMAL HIGH (ref 1.7–2.4)

## 2022-01-16 MED ORDER — HALOPERIDOL 2 MG PO TABS: 2.0000 mg | ORAL_TABLET | Freq: Three times a day (TID) | ORAL | 0 refills | Status: DC

## 2022-01-16 MED ORDER — LACTULOSE 10 GM/15ML PO SOLN: 20.0000 g | Freq: Three times a day (TID) | ORAL | 0 refills | Status: DC

## 2022-01-16 MED ORDER — VALBENAZINE TOSYLATE 40 MG PO CAPS: 40.0000 mg | ORAL_CAPSULE | Freq: Every day | ORAL | 0 refills | Status: AC

## 2022-01-16 MED ORDER — QUETIAPINE FUMARATE 100 MG PO TABS: 100.0000 mg | ORAL_TABLET | Freq: Every day | ORAL | 0 refills | Status: DC

## 2022-01-16 NOTE — TOC Benefit Eligibility Note (Signed)
Patient Teacher, English as a foreign language completed.    The patient is currently admitted and upon discharge could be taking  Ingrezza '40mg'$  .  Prior Authorization Required  The patient is insured through [place], [place] Patient Advocate Specialist [place] Patient Advocate Team Direct Number: [phone]  Fax: [phone]

## 2022-01-16 NOTE — Telephone Encounter (Signed)
Pharmacy Patient Advocate Encounter  Insurance verification completed.    The patient is insured through [facility]   The patient is currently admitted and ran test claims for the following:  Ingrezza '40mg'$  .  Copays and coinsurance results were relayed to Inpatient clinical team.

## 2022-01-16 NOTE — TOC Transition Note (Signed)
Transition of Care [facility]) - CM/SW Discharge Note   Patient Details  Name: Nicholas Oconnell MRN: [id number] Date of Birth: December 13, 1960  [facility] CM/SW Contact:  Vassie Moselle, LCSW Phone Number: 01/16/2022, 11:44 AM   Clinical Narrative:    Pt is to return to group home with HHPT/OT through [facility]. Pt has no DME needs identified. Pt's sister/legal guardian aware of pt's return to group home and is agreeable to discharge plans.   Final next level of care: Group Home (w/ [place]) Barriers to Discharge: Barriers Resolved   Patient Goals and CMS Choice Patient states their goals for this hospitalization and ongoing recovery are:: To return home   Choice offered to / list presented to : Patient, [facility] POA / [place]  Discharge Placement                Patient to be transferred to facility by: [place] Staff Name of family member notified: Cammie Mcgee Patient and family notified of of transfer: 01/16/22  Discharge Plan and Services In-house Referral: Clinical Social Work Discharge Planning Services: [employer] Consult Post Acute Care Choice: Home Health          DME Arranged: N/A DME Agency: NA       HH Arranged: PT, OT HH Agency: [facility] Date [place]: 01/16/22 Time [place]: 2330 Representative spoke with at [place]: [place] Determinants of Health ([place]) Interventions     Readmission Risk Interventions    01/08/2022   10:05 AM  Readmission Risk Prevention Plan  Transportation Screening Complete  PCP or Specialist Appt within 5-7 Days Complete  Home Care Screening Complete  Medication Review (RN CM) Complete

## 2022-01-16 NOTE — Discharge Summary (Signed)
Physician Discharge Summary  Nicholas Oconnell:[id number] DOB: Sep 24, 1960 DOA: 01/03/2022  PCP: Neale Burly, MD  Admit date: 01/03/2022 Discharge date: 01/16/2022  Admitted From: Group Home Disposition: Group Home  Recommendations for Outpatient Follow-up:  Follow up with PCP in 1 week  Outpatient follow-up with GI and psychiatry  follow up in ED if symptoms worsen or new appear   Home Health: No Equipment/Devices: None  Discharge Condition: Stable CODE STATUS: Full Diet recommendation: Heart healthy/as per SLP recommendations  Brief/Interim Summary:  61 y.o. male with medical history significant of Ogilvie syndrome, HTN, CKD3a, RCC s/p partial right nephrectomy, HTN, schizoaffective disorder presented with generalized weakness, poor PO intake and constipation.  He was admitted with acute on chronic constipation/Ogilvie syndrome.  GI was consulted.  Patient received serial enemas with improvement in abdominal distention.  GI subsequently signed off.  PT recommended SNF placement.  GI reconsulted on 01/11/2022 for persistent abdominal distention with vomiting.  Psychiatry also consulted for medication management.  Due to persistent abdominal distention and vomiting, patient underwent colonoscopy on 01/12/2022.  Subsequently, condition is improved.  He is tolerating diet with improved abdominal distention.  Psychiatry also adjusted his medication regimen.  GI and psychiatry have cleared the patient for discharge.  He will be discharged back to group home with close outpatient follow-up with psychiatry, PCP and GI if needed.  Discharge Diagnoses:   Acute on chronic constipation/Ogilvie syndrome causing abdominal distention -Patient required bowel regimen along with intermittent enemas. -Was still having intermittent vomiting with abdominal distention.  GI was reconsulted on 01/11/2022: underwent colonoscopy on 01/12/2022.   - He is tolerating diet with improved abdominal distention.    -Continue current bowel regimen.  Might need enema as needed for constipation.  Outpatient follow-up with GI if needed.  Generalized weakness -patient lives in a group home and he is on high-dose psychiatric medications, which probably contributed to his weakness.  Apparently, he gets his psychiatric medications adjusted frequently at the group home.  When he gets weak and drowsy they cut down, and when he becomes agitated or not sleeping the doses are increased again.  Seroquel and Haldol decreased on 8/6 due to severe lethargy and he seems to be improved now.   -Psychiatry consulted for medication management.  Psychiatry has cleared the patient for discharge.  Patient will need close outpatient follow-up with psychiatry.   Schizoaffective disorder/cognitive impairment -Psychiatry evaluation appreciated.  Patient was started on valbenazine by psychiatry.  Currently also on lithium. -Psychiatry has cleared the patient for discharge: Continue current regimen as per psychiatry.  Outpatient follow-up with psychiatry   AKI on CKD stage IIIa -Baseline creatinine 1.3-1.5.  Creatinine improving to 1.26 today.  Off IV fluids.  Outpatient follow-up of BMP  Hypertension -Blood pressure stable we will.  Outpatient follow-up.   Hypocalcemia/vitamin D deficiency -Continue replacement   History of tremor -There is a 11/09/21 note from family medicine that mentions evaluation with neurology. 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 It looks like no action has been taken on this. -Psychiatry started patient on valbenazine.  Plan as above -Neuro evaluation as an outpatient.   Physical deconditioning -PT now recommends home health PT at group home.  Social worker following   Discharge Instructions  Discharge Instructions     Diet - low sodium heart healthy   Complete by: As directed    Increase activity slowly   Complete  by: As directed  Allergies as of 01/16/2022       Reactions   Benzodiazepines Other (See Comments)   disinhibition Not on MAR   Clozapine Other (See Comments)   constipation - bowel obstruction Not on MAR        Medication List     STOP taking these medications    mineral oil enema   Polyethylene Glycol 3350 Powd   Veltassa 8.4 g packet Generic drug: patiromer       TAKE these medications    aMILoride 5 MG tablet Commonly known as: MIDAMOR Take 5 mg by mouth daily.   amLODipine 10 MG tablet Commonly known as: NORVASC Take 10 mg by mouth daily.   divalproex 500 MG 24 hr tablet Commonly known as: Depakote ER Take 2 tablets (1,000 mg total) by mouth at bedtime.   Enema Enem Place 1 Dose rectally daily as needed (constipation).   Ensure Take by mouth 2 (two) times daily as needed (if 50% of meal not consumed).   haloperidol 2 MG tablet Commonly known as: HALDOL Take 1 tablet (2 mg total) by mouth 3 (three) times daily. What changed:  medication strength how much to take   lactulose 10 GM/15ML solution Commonly known as: CHRONULAC Take 30 mLs (20 g total) by mouth 3 (three) times daily. What changed:  when to take this reasons to take this   lithium carbonate 150 MG capsule Take 150-300 mg by mouth See admin instructions. Take 150 mg by mouth every morning. Take 300 mg by mouth at bedtime.   Melatonin 10 MG Caps Take 10 mg by mouth at bedtime.   pantoprazole 40 MG tablet Commonly known as: PROTONIX Take 40 mg by mouth 2 (two) times daily.   polyethylene glycol 17 g packet Commonly known as: MIRALAX / GLYCOLAX Take 17 g by mouth 2 (two) times daily.   propranolol 10 MG tablet Commonly known as: INDERAL Take 10 mg by mouth 3 (three) times daily.   QUEtiapine 100 MG tablet Commonly known as: SEROQUEL Take 1 tablet (100 mg total) by mouth at bedtime. What changed:  medication strength how much to take when to take this    senna-docusate 8.6-50 MG tablet Commonly known as: Senna S Take 2 tablets by mouth 2 (two) times daily. What changed: when to take this   simethicone 125 MG chewable tablet Commonly known as: MYLICON Chew 469 mg by mouth every 6 (six) hours as needed for flatulence.   valbenazine 40 MG capsule Commonly known as: INGREZZA Take 1 capsule (40 mg total) by mouth daily.   Vitamin D (Ergocalciferol) 1.25 MG (50000 UNIT) Caps capsule Commonly known as: DRISDOL Take 1 capsule (50,000 Units total) by mouth every 7 (seven) days.   zolpidem 10 MG tablet Commonly known as: AMBIEN Take 10 mg by mouth at bedtime.        Follow-up Information     [facility] Follow up.   Why: they  have their own HHPT/HHOT;own transport Contact information: [phone]               Allergies  Allergen Reactions   Benzodiazepines Other (See Comments)    disinhibition Not on MAR   Clozapine Other (See Comments)    constipation - bowel obstruction Not on MAR    Consultations: Psychiatry/GI   Procedures/Studies: CT ABDOMEN PELVIS WO CONTRAST  Result Date: 01/12/2022 CLINICAL DATA:  Weakness and fatigue. Constipation. Suspicion for bowel obstruction. EXAM: CT ABDOMEN AND PELVIS WITHOUT CONTRAST TECHNIQUE:  Multidetector CT imaging of the abdomen and pelvis was performed following the standard protocol without IV contrast. RADIATION DOSE REDUCTION: This exam was performed according to the departmental dose-optimization program which includes automated exposure control, adjustment of the mA and/or kV according to patient size and/or use of iterative reconstruction technique. COMPARISON:  01/03/2022 FINDINGS: Study mildly degraded by patient motion. Lower chest: Small right and trace left pleural effusions, new. Mild dependent lower lobe atelectasis, increased from the prior exam. Hepatobiliary: No focal liver abnormality is seen. No gallstones, gallbladder wall thickening, or biliary  dilatation. Pancreas: Unremarkable. No pancreatic ductal dilatation or surrounding inflammatory changes. Spleen: Normal in size without focal abnormality. Adrenals/Urinary Tract: No adrenal masses. No renal mass, stone or hydronephrosis. Normal ureters. Normal bladder. Stomach/Bowel: Dilated sigmoid and right colon sigmoid colon maximum of 6.8 cm right colon maximum of 6.5 cm. No colonic wall thickening. No inflammation. Small bowel is normal in caliber. Unremarkable stomach. Multiple surgical vascular clips are noted in left upper quadrant, stable. No significant increase in the colonic stool burden. Vascular/Lymphatic: No significant vascular findings are present. No enlarged abdominal or pelvic lymph nodes. Reproductive: Unremarkable. Other: No abdominal wall hernia or abnormality. No abdominopelvic ascites. Musculoskeletal: No fracture or acute finding.  No bone lesion. IMPRESSION: 1. There is been significant interval improvement. Colonic dilation has decreased from the prior study and there has been a significant decrease in the colonic stool burden. 2. No evidence of bowel obstruction or inflammation. 3. Small right and trace left pleural effusions with associated basilar atelectasis, effusions new, atelectasis increased from the prior study. Electronically Signed   By: Lajean Manes M.D.   On: 01/12/2022 14:40   DG Abd 1 View  Result Date: 01/12/2022 CLINICAL DATA:  [id number]; follow up ileus EXAM: ABDOMEN - 1 VIEW COMPARISON:  Abdominal x-ray from yesterday FINDINGS: Again seen is the severe diffuse gaseous distention of the colon with a gaseous distended transverse colon measuring up to 10 cm and has mildly increased in the interim. Multiple surgical clips in the left abdomen. IMPRESSION: Severe diffuse gaseous distention of the colon and has mildly increased in the interim. Findings may be on the basis of ileus. Continued follow-up is suggested. Electronically Signed   By: Frazier [name] M.D.   On:  01/12/2022 08:54   DG Abd 1 View  Result Date: 01/11/2022 CLINICAL DATA:  Intractable nausea and vomiting. EXAM: ABDOMEN - 1 VIEW COMPARISON:  Abdominal x-ray from yesterday. FINDINGS: Diffuse colonic dilatation is not significantly changed. Multiple surgical clips in the left abdomen again noted. No acute osseous abnormality. IMPRESSION: 1. Unchanged colonic ileus. Electronically Signed   By: Titus Dubin M.D.   On: 01/11/2022 08:46   DG Abd 1 View  Result Date: 01/10/2022 CLINICAL DATA:  Vomiting.  History of bowel obstruction. EXAM: ABDOMEN - 1 VIEW COMPARISON:  Abdomen and pelvis CT dated 01/03/2022 FINDINGS: Dilated right and transverse colon without significant change. Decreased caliber of the rectosigmoid colon. Significantly less stool. Postsurgical changes on the left with surgical clips. Unremarkable bones. IMPRESSION: Similar colonic ileus with a marked reduction in amount of stool. Electronically Signed   By: Claudie Revering M.D.   On: 01/10/2022 13:04   CT ABDOMEN PELVIS W CONTRAST  Result Date: 01/03/2022 CLINICAL DATA:  Generalized weakness, abdominal pain, decreased appetite, constipation EXAM: CT ABDOMEN AND PELVIS WITH CONTRAST TECHNIQUE: Multidetector CT imaging of the abdomen and pelvis was performed using the standard protocol following bolus administration of intravenous contrast. RADIATION DOSE REDUCTION: This  exam was performed according to the departmental dose-optimization program which includes automated exposure control, adjustment of the mA and/or kV according to patient size and/or use of iterative reconstruction technique. CONTRAST:  13m OMNIPAQUE IOHEXOL 300 MG/ML  SOLN COMPARISON:  06/28/2021 FINDINGS: Lower chest: No acute abnormality. Hepatobiliary: No solid liver abnormality is seen. No gallstones, gallbladder wall thickening, or biliary dilatation. Pancreas: Unremarkable. No pancreatic ductal dilatation or surrounding inflammatory changes. Spleen: Normal in size  without significant abnormality. Adrenals/Urinary Tract: Adrenal glands are unremarkable. Kidneys are normal, without renal calculi, solid lesion, or hydronephrosis. Bladder is unremarkable. Stomach/Bowel: Stomach is within normal limits. Evidence of partial left colon resection and reanastomosis. Extremely large burden of stool in the distal colon, stool balls in the distal colon and rectum measuring up to 7.2 cm. Vascular/Lymphatic: No significant vascular findings are present. No enlarged abdominal or pelvic lymph nodes. Reproductive: No mass or other significant abnormality. Other: No abdominal wall hernia or abnormality. No ascites. Musculoskeletal: No acute or significant osseous findings. IMPRESSION: 1. Extremely large burden of stool and large stool balls in the distal colon and rectum. 2. Evidence of partial left colon resection and reanastomosis. Electronically Signed   By: ADelanna AhmadiM.D.   On: 01/03/2022 12:31   CT Head Wo Contrast  Result Date: 01/03/2022 CLINICAL DATA:  Generalized weakness, fatigue, decreased appetite. EXAM: CT HEAD WITHOUT CONTRAST TECHNIQUE: Contiguous axial images were obtained from the base of the skull through the vertex without intravenous contrast. RADIATION DOSE REDUCTION: This exam was performed according to the departmental dose-optimization program which includes automated exposure control, adjustment of the mA and/or kV according to patient size and/or use of iterative reconstruction technique. COMPARISON:  CT head 12/12/2018 FINDINGS: Brain: There is no acute intracranial hemorrhage, extra-axial fluid collection, or acute infarct. Parenchymal volume is normal. The ventricles are stable in size. Gray-white differentiation is preserved There is no mass lesion.  There is no mass effect or midline shift. Vascular: No hyperdense vessel or unexpected calcification. Skull: Normal. Negative for fracture or focal lesion. Sinuses/Orbits: The paranasal sinuses are clear. The  globes and orbits are unremarkable. Other: None. IMPRESSION: No acute intracranial pathology. Electronically Signed   By: PValetta MoleM.D.   On: 01/03/2022 10:37   DG ABD ACUTE 2+V W 1V CHEST  Result Date: 01/03/2022 CLINICAL DATA:  Abdominal pain. Generalized weakness, fatigue, and decreased appetite. EXAM: DG ABDOMEN ACUTE WITH 1 VIEW CHEST COMPARISON:  AP chest 11/08/2021; KUB 07/05/2021; CT abdomen and pelvis FINDINGS: Mildly decreased lung volumes. Cardiac silhouette and mediastinal contours are within normal limits. The lungs are clear. No pleural effusion or pneumothorax. Mild multilevel degenerative disc changes of the thoracic spine. Surgical clips overlie the left upper abdominal quadrant. The sigmoid colon is highly distended with air, however this appears similar to 07/05/2021 frontal radiograph. There is also new high-grade stool within the rectum and sigmoid colon concerning for fecal impaction. High-grade stool is also seen throughout the ascending and transverse colon. Note is made the patient appears to be status post partial colectomy on prior CT. No definite dilated loops of small bowel to indicate bowel obstruction. Vascular phleboliths again overlie the right hemipelvis. No subdiaphragmatic free air is seen. IMPRESSION: Note is made of postsurgical change of partial colectomy on prior CT. There is air-filled distention of the postsurgical colon which is very similar to multiple prior radiographs and 06/28/2021 CT. There is also high-grade stool seen within the rectum and postsurgical remaining colon. Findings are suspicious for constipation and  possible rectal impaction. Electronically Signed   By: Yvonne Kendall M.D.   On: 01/03/2022 10:08      Subjective: Patient seen and examined at bedside.  Poor historian.  No fever, vomiting, seizures reported nursing staff.  Discharge Exam: Vitals:   01/15/22 2118 01/16/22 0554  BP: (!) 132/92 125/81  Pulse: (!) 42 72  Resp: 20 18  Temp:  98.8 F (37.1 C) 98.7 F (37.1 C)  SpO2: 90% 91%    General: Pt is alert, awake, not in acute distress.  On room air.  Chronically ill looking.  Poor historian.  Slow to respond.  Mild upper extremity and facial tremors noted Cardiovascular: rate controlled currently, S1/S2 + Respiratory: bilateral decreased breath sounds at bases with scattered crackles Abdominal: Soft, NT, ND, bowel sounds + Extremities: Trace lower extremity edema present; no cyanosis    The results of significant diagnostics from this hospitalization (including imaging, microbiology, ancillary and laboratory) are listed below for reference.     Microbiology: No results found for this or any previous visit (from the past 240 hour(s)).   Labs: BNP (last 3 results) No results for input(s): "BNP" in the last 8760 hours. Basic Metabolic Panel: Recent Labs  Lab 01/10/22 0503 01/12/22 0629 01/13/22 0519 01/14/22 0545 01/16/22 0636  NA 140 142 141 143 147*  K 5.0 4.4 4.3 4.1 3.8  CL 105 107 106 110 117*  CO2 '24 26 26 25 26  '$ GLUCOSE 100* 104* 90 94 84  BUN 27* 29* 27* 22 13  CREATININE 1.43* 1.79* 1.52* 1.51* 1.26*  CALCIUM 9.5 9.2 8.7* 8.6* 8.7*  MG  --  3.1* 2.7*  --  2.6*   Liver Function Tests: Recent Labs  Lab 01/12/22 0629  AST 23  ALT 28  ALKPHOS 102  BILITOT 0.6  PROT 7.0  ALBUMIN 3.3*   Recent Labs  Lab 01/12/22 0629  LIPASE 68*   No results for input(s): "AMMONIA" in the last 168 hours. CBC: Recent Labs  Lab 01/10/22 0503 01/12/22 0629  WBC 8.7 10.1  NEUTROABS  --  6.2  HGB 14.6 13.9  HCT 46.2 43.1  MCV 94.3 92.7  PLT 295 394   Cardiac Enzymes: No results for input(s): "CKTOTAL", "CKMB", "CKMBINDEX", "TROPONINI" in the last 168 hours. BNP: Invalid input(s): "POCBNP" CBG: No results for input(s): "GLUCAP" in the last 168 hours. D-Dimer No results for input(s): "DDIMER" in the last 72 hours. Hgb A1c No results for input(s): "HGBA1C" in the last 72 hours. Lipid  Profile No results for input(s): "CHOL", "HDL", "LDLCALC", "TRIG", "CHOLHDL", "LDLDIRECT" in the last 72 hours. Thyroid function studies No results for input(s): "TSH", "T4TOTAL", "T3FREE", "THYROIDAB" in the last 72 hours.  Invalid input(s): "FREET3" Anemia work up No results for input(s): "VITAMINB12", "FOLATE", "FERRITIN", "TIBC", "IRON", "RETICCTPCT" in the last 72 hours. Urinalysis    Component Value Date/Time   COLORURINE STRAW (A) 01/03/2022 1641   APPEARANCEUR CLEAR 01/03/2022 1641   LABSPEC 1.028 01/03/2022 1641   PHURINE 6.0 01/03/2022 1641   GLUCOSEU NEGATIVE 01/03/2022 1641   HGBUR NEGATIVE 01/03/2022 1641   BILIRUBINUR NEGATIVE 01/03/2022 1641   KETONESUR NEGATIVE 01/03/2022 1641   PROTEINUR NEGATIVE 01/03/2022 1641   NITRITE NEGATIVE 01/03/2022 1641   LEUKOCYTESUR NEGATIVE 01/03/2022 1641   Sepsis Labs Recent Labs  Lab 01/10/22 0503 01/12/22 0629  WBC 8.7 10.1   Microbiology No results found for this or any previous visit (from the past 240 hour(s)).   Time coordinating discharge: 35 minutes  SIGNED:   Aline August, MD  [facility] 01/16/2022, 11:05 AM

## 2022-01-16 NOTE — Telephone Encounter (Signed)
Patient Advocate Encounter  Prior Authorization for Ingrezza '40mg'$   has been approved.    PA# [id number] Effective dates: 01/16/2022 through 07/15/2022  Patients co-pay is $0.00.     Lyndel Safe, [place] Patient Advocate Specialist [place] Patient Advocate Team Direct Number: [phone]  Fax: [phone]

## 2022-01-16 NOTE — Telephone Encounter (Signed)
Patient Advocate Encounter   Received notification  that prior authorization for  Ingrezza '40mg'$  is required.   PA submitted on 01/16/2022 Confirmation # [id number] [address] [place] Medicaid Status is pending       Lyndel Safe, [place] Patient Advocate Specialist [place] Patient Advocate Team Direct Number: [phone]  Fax: [phone]

## 2022-01-24 ENCOUNTER — Encounter (HOSPITAL_COMMUNITY): Payer: Self-pay

## 2022-01-24 ENCOUNTER — Emergency Department (HOSPITAL_COMMUNITY): Payer: Medicaid Other

## 2022-01-24 ENCOUNTER — Emergency Department (HOSPITAL_COMMUNITY)
Admission: EM | Admit: 2022-01-24 | Discharge: 2022-01-25 | Disposition: A | Discharge status: Home / Self Care | Payer: Medicaid Other | Attending: Emergency Medicine | Admitting: Emergency Medicine

## 2022-01-24 DIAGNOSIS — N189 Chronic kidney disease, unspecified: Secondary | ICD-10-CM | POA: Diagnosis not present

## 2022-01-24 DIAGNOSIS — I10 Essential (primary) hypertension: Secondary | ICD-10-CM

## 2022-01-24 DIAGNOSIS — I129 Hypertensive chronic kidney disease with stage 1 through stage 4 chronic kidney disease, or unspecified chronic kidney disease: Secondary | ICD-10-CM | POA: Insufficient documentation

## 2022-01-24 DIAGNOSIS — Z79899 Other long term (current) drug therapy: Secondary | ICD-10-CM | POA: Diagnosis not present

## 2022-01-24 DIAGNOSIS — R000 Tachycardia, unspecified: Secondary | ICD-10-CM | POA: Diagnosis present

## 2022-01-24 LAB — BASIC METABOLIC PANEL
Anion gap: 6 (ref 5–15)
BUN: 16 mg/dL (ref 8–23)
CO2: 24 mmol/L (ref 22–32)
Calcium: 8.5 mg/dL — ABNORMAL LOW (ref 8.9–10.3)
Chloride: 108 mmol/L (ref 98–111)
Creatinine, Ser: 1.45 mg/dL — ABNORMAL HIGH (ref 0.61–1.24)
GFR, Estimated: 54 mL/min — ABNORMAL LOW (ref >60)
Glucose, Bld: 104 mg/dL — ABNORMAL HIGH (ref 70–99)
Potassium: 4.7 mmol/L (ref 3.5–5.1)
Sodium: 138 mmol/L (ref 135–145)

## 2022-01-24 LAB — TROPONIN I (HIGH SENSITIVITY): Troponin I (High Sensitivity): 13 ng/L (ref <18)

## 2022-01-24 LAB — CBC
HCT: 39.2 % (ref 39.0–52.0)
Hemoglobin: 12.6 g/dL — ABNORMAL LOW (ref 13.0–17.0)
MCH: 30.4 pg (ref 26.0–34.0)
MCHC: 32.1 g/dL (ref 30.0–36.0)
MCV: 94.5 fL (ref 80.0–100.0)
Platelets: 348 10*3/uL (ref 150–400)
RBC: 4.15 MIL/uL — ABNORMAL LOW (ref 4.22–5.81)
RDW: 14.9 % (ref 11.5–15.5)
WBC: 5.4 10*3/uL (ref 4.0–10.5)
nRBC: 0 % (ref 0.0–0.2)

## 2022-01-24 NOTE — ED Notes (Signed)
Attempted to call legal guardian x2 with no answer.

## 2022-01-24 NOTE — Discharge Instructions (Signed)
Nicholas Oconnell was seen in the emergency department for elevated blood pressure and elevated heart rate.  As we discussed we saw no evidence of atrial fibrillation or flutter while he was in the ER.  His blood pressure has been slightly high, but I do not believe this requires urgent intervention.  I recommend following up with his primary doctor, or whoever manages his blood pressure medicine, as there may need to be changes to his regimen.  Continue to monitor how he's doing and return to the ER for new or worsening symptoms.

## 2022-01-24 NOTE — ED Provider Triage Note (Signed)
Emergency Medicine Provider Triage Evaluation Note  Nicholas Oconnell , a 61 y.o. male  was evaluated in triage.  Patient presents with his caregiver.  Patient denies any complaints currently.  Caregiver states he was not with the patient when he was noted to be in A-fib, and was reported to have low oxygen level.  Current the patient is in sinus rhythm with 99% on room air.  Denies pain anywhere.  Review of Systems  Positive: As above Negative: As above  Physical Exam  BP (!) 137/104   Pulse 81   Temp 98.8 F (37.1 C) (Oral)   Resp 18   SpO2 98%  Gen:   Awake, no distress   Resp:  Normal effort  MSK:   Moves extremities without difficulty  Other:   Medical Decision Making  Medically screening exam initiated at 5:45 PM.  Appropriate orders placed.  Demian Maisel was informed that the remainder of the evaluation will be completed by another provider, this initial triage assessment does not replace that evaluation, and the importance of remaining in the ED until their evaluation is complete.     Evlyn Courier, PA-C 01/24/22 1746

## 2022-01-24 NOTE — ED Provider Notes (Signed)
[name] Provider Note   CSN: [id number] Arrival date & time: 01/24/22  1704     History  Chief Complaint  Patient presents with   Hypertension   Tachycardia    Nicholas Oconnell is a 61 y.o. male with a history of anxiety, bipolar affective, chronic kidney disease, cognitive deficits, hypertension, and seizures who presents the emergency department with concern for elevated blood pressure and elevated heart rate.  Physical therapist was at the house today and noted the patient's blood pressure to be with systolics in the 481E.  They checked his heart rate and it went as high as 175 bpm.  They were concerned that he could be in atrial fibrillation and sent him to the ER for evaluation.  Patient states he did not have any symptoms at that time, and has had no complaints all day.  Patient lives in a group home, and caretakers are with him at bedside to corroborate history.  Patient denies any chest pain, shortness of breath, or leg swelling. No history of blood clots. No recent long travel.    Hypertension Pertinent negatives include no chest pain, no abdominal pain and no shortness of breath.       Home Medications Prior to Admission medications   Medication Sig Start Date End Date Taking? Authorizing Provider  divalproex (DEPAKOTE ER) 500 MG 24 hr tablet Take 2 tablets (1,000 mg total) by mouth at bedtime. 04/28/21  Yes Isla Pence, MD  haloperidol (HALDOL) 2 MG tablet Take 1 tablet (2 mg total) by mouth 3 (three) times daily. 01/16/22  Yes Aline August, MD  lithium carbonate 150 MG capsule Take 150-300 mg by mouth See admin instructions. Take 150 mg by mouth every morning. Take 300 mg by mouth at bedtime.   Yes [provider]  Melatonin 10 MG CAPS Take 10 mg by mouth at bedtime.   Yes [provider]  pantoprazole (PROTONIX) 40 MG tablet Take 40 mg by mouth 2 (two) times daily.   Yes [provider]  patiromer  (VELTASSA) 8.4 g packet Take 8.4 g by mouth daily.   Yes [provider]  polyethylene glycol (MIRALAX / GLYCOLAX) 17 g packet Take 17 g by mouth 2 (two) times daily. 01/06/22 02/05/22 Yes Gherghe, Vella Redhead, MD  propranolol (INDERAL) 10 MG tablet Take 10 mg by mouth 3 (three) times daily.   Yes [provider]  QUEtiapine (SEROQUEL) 400 MG tablet Take 400 mg by mouth 2 (two) times daily.   Yes [provider]  senna-docusate (SENNA S) 8.6-50 MG tablet Take 2 tablets by mouth 2 (two) times daily. 01/06/22 02/05/22 Yes Gherghe, Vella Redhead, MD  simethicone (MYLICON) 563 MG chewable tablet Chew 125 mg by mouth every 6 (six) hours as needed for flatulence.   Yes [provider]  Sodium Phosphates (ENEMA) ENEM Place 1 Dose rectally daily as needed (constipation).   Yes [provider]  Vitamin D, Ergocalciferol, (DRISDOL) 1.25 MG (50000 UNIT) CAPS capsule Take 1 capsule (50,000 Units total) by mouth every 7 (seven) days. 01/11/22  Yes Gherghe, Vella Redhead, MD  zolpidem (AMBIEN) 10 MG tablet Take 10 mg by mouth at bedtime. 06/21/20  Yes [provider]  amLODipine (NORVASC) 10 MG tablet Take 10 mg by mouth daily. Patient not taking: Reported on 01/03/2022    [provider]  Ensure (ENSURE) Take by mouth 2 (two) times daily as needed (if 50% of meal not consumed).    [provider]  lactulose (CHRONULAC) 10 GM/15ML solution Take 30 mLs (20 g total) by mouth 3 (three) times daily. 01/16/22   Aline August, MD  QUEtiapine (SEROQUEL) 100 MG tablet Take 1 tablet (100 mg total) by mouth at bedtime. Patient not taking: Reported on 01/24/2022 01/16/22   Aline August, MD  valbenazine [facility]) 40 MG capsule Take 1 capsule (40 mg total) by mouth daily. Patient not taking: Reported on 01/24/2022 01/16/22   Suella Broad, FNP      Allergies    Benzodiazepines and Clozapine    Review of Systems   Review of Systems  Constitutional:  Negative for  chills and fever.  Respiratory:  Negative for cough, chest tightness and shortness of breath.   Cardiovascular:  Negative for chest pain, palpitations and leg swelling.  Gastrointestinal:  Negative for abdominal pain, nausea and vomiting.  Neurological:  Negative for syncope.  All other systems reviewed and are negative.   Physical Exam Updated Vital Signs BP (!) 156/102 (BP Location: Left Arm)   Pulse 96   Temp 98.9 F (37.2 C) (Oral)   Resp 18   SpO2 100%  Physical Exam Vitals and nursing note reviewed.  Constitutional:      Appearance: Normal appearance.  HENT:     Head: Normocephalic and atraumatic.  Eyes:     Conjunctiva/sclera: Conjunctivae normal.  Cardiovascular:     Rate and Rhythm: Normal rate and regular rhythm.  Pulmonary:     Effort: Pulmonary effort is normal. No respiratory distress.     Breath sounds: Normal breath sounds.  Abdominal:     General: There is no distension.     Palpations: Abdomen is soft.     Tenderness: There is no abdominal tenderness.  Musculoskeletal:     Right lower leg: No edema.     Left lower leg: No edema.  Skin:    General: Skin is warm and dry.  Neurological:     General: No focal deficit present.     Mental Status: He is alert.     ED Results / Procedures / Treatments   Labs (all labs ordered are listed, but only abnormal results are displayed) Labs Reviewed  CBC - Abnormal; Notable for the following components:      Result Value   RBC 4.15 (*)    Hemoglobin 12.6 (*)    All other components within normal limits  BASIC METABOLIC PANEL - Abnormal; Notable for the following components:   Glucose, Bld 104 (*)    Creatinine, Ser 1.45 (*)    Calcium 8.5 (*)    GFR, Estimated 54 (*)    All other components within normal limits  TROPONIN I (HIGH SENSITIVITY) - Abnormal; Notable for the following components:   Troponin I (High Sensitivity) 25 (*)    All other components within normal limits  TROPONIN I (HIGH SENSITIVITY)   TROPONIN I (HIGH SENSITIVITY)  TROPONIN I (HIGH SENSITIVITY)    EKG EKG Interpretation  Date/Time:  Wednesday January 24 2022 17:26:41 EDT Ventricular Rate:  83 PR Interval:    QRS Duration: 95 QT Interval:  403 QTC Calculation: 474 R Axis:   105 Text Interpretation: Normal sinus rhythm Right axis deviation Minimal ST depression, inferior leads Artifact in lead(s) I Confirmed by Dorie Rank [phone]) on 01/24/2022 10:29:40 PM  Radiology DG Chest 2 View  Result Date: 01/24/2022 CLINICAL DATA:  Dyspnea. EXAM: CHEST - 2 VIEW COMPARISON:  Chest from abdominal radiographs 01/03/2022 FINDINGS: Low lung volumes.The cardiomediastinal contours are  normal. Minor bibasilar atelectasis. Pulmonary vasculature is normal. No consolidation, pleural effusion, or pneumothorax. No acute osseous abnormalities are seen. IMPRESSION: Low lung volumes with minor bibasilar atelectasis. Electronically Signed   By: Keith Rake M.D.   On: 01/24/2022 18:33    Procedures Procedures    Medications Ordered in ED Medications - No data to display  ED Course/ Medical Decision Making/ A&P                           Medical Decision Making Patient is a 61 year old male with a history of anxiety, bipolar affective, chronic kidney disease, cognitive deficits, hypertension, and seizures who presents the emergency department with concern for elevated blood pressure and elevated heart rate.  Additional history: Patient has no documented history of blood clots.  Not on chronic anticoagulation.  Previous EKGs in June of this year with atrial flutter. Several EKGs since then with sinus rhythm. Echocardiogram from 2023 with ejection fraction 70 to 75%.  On exam patient is hypertensive to 150/102. Not tachycardic or tachypneic. Normal oxygen saturation on room air. Heart regular rate and rhythm. No increased work of breathing. Lung sounds clear. Baseline mental status per caregivers. No peripheral edema. Patient adamant that  he has not had any chest pain or difficulty breathing.   CBC unremarkable.  BMP unremarkable, with creatinine at baseline.  Initial troponin 13, second troponin 25, third troponin 13.   Chest x-ray without acute cardiopulmonary abnormality.  Cardiac monitoring performed an EKG obtained, interpreted by my attending physician which shows normal sinus rhythm.  Discussed with the patient and the caregivers that we have to know evidence of him having an irregular heart rhythm during his ER visit.  While he is hypertensive, he is having no symptoms, and I have very low suspicion for hypertensive urgency.  His second troponin was elevated from the first, and third value returned to previous.  I have very low suspicion for acute cardiac or pulmonary disease today.  He has stable vital signs, his heart rhythm is regular, breath sounds are equal, no peripheral edema.  Wells criteria for PE negative.  EKG without acute abnormalities, normal chest x-ray.  Heart score of 3.  Patient to be discharged back to group home and given close return precautions.  I discussed this case with my attending physician Dr. Ralene Bathe who cosigned this note including patient's presenting symptoms, physical exam, and planned diagnostics and interventions. Attending physician stated agreement with plan or made changes to plan which were implemented.    Final Clinical Impression(s) / ED Diagnoses Final diagnoses:  Hypertension, unspecified type    Rx / DC Orders ED Discharge Orders     None      Portions of this report may have been transcribed using voice recognition software. Every effort was made to ensure accuracy; however, inadvertent computerized transcription errors may be present.    Estill Cotta 01/25/22 0329    Quintella Reichert, MD 01/25/22 [phone]

## 2022-01-24 NOTE — ED Provider Notes (Incomplete)
[facility] [place] DEPT Provider Note   CSN: [id number] Arrival date & time: 01/24/22  1704     History {Add pertinent medical, surgical, social history, OB history to HPI:1} Chief Complaint  Patient presents with  . Hypertension  . Tachycardia    Kenny Rea is a 61 y.o. male.   Hypertension       Home Medications Prior to Admission medications   Medication Sig Start Date End Date Taking? Authorizing Provider  divalproex (DEPAKOTE ER) 500 MG 24 hr tablet Take 2 tablets (1,000 mg total) by mouth at bedtime. 04/28/21  Yes Isla Pence, MD  haloperidol (HALDOL) 2 MG tablet Take 1 tablet (2 mg total) by mouth 3 (three) times daily. 01/16/22  Yes Aline August, MD  lithium carbonate 150 MG capsule Take 150-300 mg by mouth See admin instructions. Take 150 mg by mouth every morning. Take 300 mg by mouth at bedtime.   Yes [provider]  Melatonin 10 MG CAPS Take 10 mg by mouth at bedtime.   Yes [provider]  pantoprazole (PROTONIX) 40 MG tablet Take 40 mg by mouth 2 (two) times daily.   Yes [provider]  patiromer (VELTASSA) 8.4 g packet Take 8.4 g by mouth daily.   Yes [provider]  polyethylene glycol (MIRALAX / GLYCOLAX) 17 g packet Take 17 g by mouth 2 (two) times daily. 01/06/22 02/05/22 Yes Gherghe, Vella Redhead, MD  propranolol (INDERAL) 10 MG tablet Take 10 mg by mouth 3 (three) times daily.   Yes [provider]  QUEtiapine (SEROQUEL) 400 MG tablet Take 400 mg by mouth 2 (two) times daily.   Yes [provider]  senna-docusate (SENNA S) 8.6-50 MG tablet Take 2 tablets by mouth 2 (two) times daily. 01/06/22 02/05/22 Yes Gherghe, Vella Redhead, MD  simethicone (MYLICON) 213 MG chewable tablet Chew 125 mg by mouth every 6 (six) hours as needed for flatulence.   Yes [provider]  Sodium Phosphates (ENEMA) ENEM Place 1 Dose rectally daily as needed (constipation).   Yes [provider]   Vitamin D, Ergocalciferol, (DRISDOL) 1.25 MG (50000 UNIT) CAPS capsule Take 1 capsule (50,000 Units total) by mouth every 7 (seven) days. 01/11/22  Yes Gherghe, Vella Redhead, MD  zolpidem (AMBIEN) 10 MG tablet Take 10 mg by mouth at bedtime. 06/21/20  Yes [provider]  amLODipine (NORVASC) 10 MG tablet Take 10 mg by mouth daily. Patient not taking: Reported on 01/03/2022    [provider]  Ensure (ENSURE) Take by mouth 2 (two) times daily as needed (if 50% of meal not consumed).    [provider]  lactulose (CHRONULAC) 10 GM/15ML solution Take 30 mLs (20 g total) by mouth 3 (three) times daily. 01/16/22   Aline August, MD  QUEtiapine (SEROQUEL) 100 MG tablet Take 1 tablet (100 mg total) by mouth at bedtime. Patient not taking: Reported on 01/24/2022 01/16/22   Aline August, MD  valbenazine [facility]) 40 MG capsule Take 1 capsule (40 mg total) by mouth daily. Patient not taking: Reported on 01/24/2022 01/16/22   Suella Broad, FNP      Allergies    Benzodiazepines and Clozapine    Review of Systems   Review of Systems  Physical Exam Updated Vital Signs BP (!) 141/102   Pulse 100   Temp 98.4 F (36.9 C)   Resp 16   SpO2 100%  Physical Exam  ED Results / Procedures / Treatments   Labs (all labs  ordered are listed, but only abnormal results are displayed) Labs Reviewed  CBC - Abnormal; Notable for the following components:      Result Value   RBC 4.15 (*)    Hemoglobin 12.6 (*)    All other components within normal limits  BASIC METABOLIC PANEL - Abnormal; Notable for the following components:   Glucose, Bld 104 (*)    Creatinine, Ser 1.45 (*)    Calcium 8.5 (*)    GFR, Estimated 54 (*)    All other components within normal limits  TROPONIN I (HIGH SENSITIVITY)  TROPONIN I (HIGH SENSITIVITY)    EKG EKG Interpretation  Date/Time:  Wednesday January 24 2022 17:26:41 EDT Ventricular Rate:  83 PR Interval:    QRS Duration: 95 QT  Interval:  403 QTC Calculation: 474 R Axis:   105 Text Interpretation: Normal sinus rhythm Right axis deviation Minimal ST depression, inferior leads Artifact in lead(s) I Confirmed by Dorie Rank [phone]) on 01/24/2022 10:29:40 PM  Radiology DG Chest 2 View  Result Date: 01/24/2022 CLINICAL DATA:  Dyspnea. EXAM: CHEST - 2 VIEW COMPARISON:  Chest from abdominal radiographs 01/03/2022 FINDINGS: Low lung volumes.The cardiomediastinal contours are normal. Minor bibasilar atelectasis. Pulmonary vasculature is normal. No consolidation, pleural effusion, or pneumothorax. No acute osseous abnormalities are seen. IMPRESSION: Low lung volumes with minor bibasilar atelectasis. Electronically Signed   By: Keith Rake M.D.   On: 01/24/2022 18:33    Procedures Procedures  {Document cardiac monitor, telemetry assessment procedure when appropriate:1}  Medications Ordered in ED Medications - No data to display  ED Course/ Medical Decision Making/ A&P                           Medical Decision Making  ***  {Document critical care time when appropriate:1} {Document review of labs and clinical decision tools ie heart score, Chads2Vasc2 etc:1}  {Document your independent review of radiology images, and any outside records:1} {Document your discussion with family members, caretakers, and with consultants:1} {Document social determinants of health affecting pt's care:1} {Document your decision making why or why not admission, treatments were needed:1} Final Clinical Impression(s) / ED Diagnoses Final diagnoses:  None    Rx / DC Orders ED Discharge Orders     None

## 2022-01-24 NOTE — ED Triage Notes (Signed)
Pt arrived via POV, with group home staff. Per staff pt had hight BP today, had nurse come to check VS and stated by was in afib in the [place]. Pt hr 80s in triage

## 2022-01-25 LAB — TROPONIN I (HIGH SENSITIVITY)
Troponin I (High Sensitivity): 13 ng/L (ref <18)
Troponin I (High Sensitivity): 25 ng/L — ABNORMAL HIGH (ref <18)

## 2022-02-07 ENCOUNTER — Inpatient Hospital Stay (HOSPITAL_COMMUNITY)
Admission: EM | Admit: 2022-02-07 | Discharge: 2022-02-14 | DRG: 682 | Disposition: A | Discharge status: Home / Self Care | Payer: Medicaid Other | Attending: Internal Medicine | Admitting: Internal Medicine

## 2022-02-07 ENCOUNTER — Encounter (HOSPITAL_COMMUNITY): Payer: Self-pay

## 2022-02-07 ENCOUNTER — Emergency Department (HOSPITAL_COMMUNITY): Payer: Medicaid Other

## 2022-02-07 ENCOUNTER — Other Ambulatory Visit: Payer: Self-pay

## 2022-02-07 DIAGNOSIS — K5909 Other constipation: Secondary | ICD-10-CM | POA: Diagnosis present

## 2022-02-07 DIAGNOSIS — Z79899 Other long term (current) drug therapy: Secondary | ICD-10-CM | POA: Diagnosis not present

## 2022-02-07 DIAGNOSIS — Z905 Acquired absence of kidney: Secondary | ICD-10-CM

## 2022-02-07 DIAGNOSIS — I129 Hypertensive chronic kidney disease with stage 1 through stage 4 chronic kidney disease, or unspecified chronic kidney disease: Secondary | ICD-10-CM | POA: Diagnosis present

## 2022-02-07 DIAGNOSIS — Z85528 Personal history of other malignant neoplasm of kidney: Secondary | ICD-10-CM | POA: Diagnosis not present

## 2022-02-07 DIAGNOSIS — U071 COVID-19: Secondary | ICD-10-CM

## 2022-02-07 DIAGNOSIS — K5981 Ogilvie syndrome: Secondary | ICD-10-CM | POA: Diagnosis present

## 2022-02-07 DIAGNOSIS — N179 Acute kidney failure, unspecified: Principal | ICD-10-CM

## 2022-02-07 DIAGNOSIS — K5900 Constipation, unspecified: Secondary | ICD-10-CM

## 2022-02-07 DIAGNOSIS — I10 Essential (primary) hypertension: Secondary | ICD-10-CM | POA: Diagnosis not present

## 2022-02-07 DIAGNOSIS — F250 Schizoaffective disorder, bipolar type: Secondary | ICD-10-CM

## 2022-02-07 DIAGNOSIS — N1831 Chronic kidney disease, stage 3a: Secondary | ICD-10-CM | POA: Diagnosis present

## 2022-02-07 DIAGNOSIS — R778 Other specified abnormalities of plasma proteins: Secondary | ICD-10-CM | POA: Diagnosis not present

## 2022-02-07 LAB — COMPREHENSIVE METABOLIC PANEL
ALT: 29 U/L (ref 0–44)
AST: 40 U/L (ref 15–41)
Albumin: 3.6 g/dL (ref 3.5–5.0)
Alkaline Phosphatase: 114 U/L (ref 38–126)
Anion gap: 13 (ref 5–15)
BUN: 25 mg/dL — ABNORMAL HIGH (ref 8–23)
CO2: 20 mmol/L — ABNORMAL LOW (ref 22–32)
Calcium: 8.6 mg/dL — ABNORMAL LOW (ref 8.9–10.3)
Chloride: 106 mmol/L (ref 98–111)
Creatinine, Ser: 2.33 mg/dL — ABNORMAL HIGH (ref 0.61–1.24)
GFR, Estimated: 31 mL/min — ABNORMAL LOW (ref >60)
Glucose, Bld: 122 mg/dL — ABNORMAL HIGH (ref 70–99)
Potassium: 4.6 mmol/L (ref 3.5–5.1)
Sodium: 139 mmol/L (ref 135–145)
Total Bilirubin: 0.6 mg/dL (ref 0.3–1.2)
Total Protein: 8 g/dL (ref 6.5–8.1)

## 2022-02-07 LAB — CBC WITH DIFFERENTIAL/PLATELET
Abs Immature Granulocytes: 0.05 10*3/uL (ref 0.00–0.07)
Basophils Absolute: 0 10*3/uL (ref 0.0–0.1)
Basophils Relative: 0 %
Eosinophils Absolute: 0 10*3/uL (ref 0.0–0.5)
Eosinophils Relative: 0 %
HCT: 41.5 % (ref 39.0–52.0)
Hemoglobin: 13 g/dL (ref 13.0–17.0)
Immature Granulocytes: 1 %
Lymphocytes Relative: 13 %
Lymphs Abs: 1.1 10*3/uL (ref 0.7–4.0)
MCH: 29.5 pg (ref 26.0–34.0)
MCHC: 31.3 g/dL (ref 30.0–36.0)
MCV: 94.1 fL (ref 80.0–100.0)
Monocytes Absolute: 1.1 10*3/uL — ABNORMAL HIGH (ref 0.1–1.0)
Monocytes Relative: 13 %
Neutro Abs: 6.2 10*3/uL (ref 1.7–7.7)
Neutrophils Relative %: 73 %
Platelets: 206 10*3/uL (ref 150–400)
RBC: 4.41 MIL/uL (ref 4.22–5.81)
RDW: 15 % (ref 11.5–15.5)
WBC: 8.5 10*3/uL (ref 4.0–10.5)
nRBC: 0.2 % (ref 0.0–0.2)

## 2022-02-07 LAB — LITHIUM LEVEL: Lithium Lvl: 0.66 mmol/L (ref 0.60–1.20)

## 2022-02-07 LAB — LACTIC ACID, PLASMA: Lactic Acid, Venous: 1.5 mmol/L (ref 0.5–1.9)

## 2022-02-07 LAB — RESP PANEL BY RT-PCR (FLU A&B, COVID) ARPGX2
Influenza A by PCR: NEGATIVE
Influenza B by PCR: NEGATIVE
SARS Coronavirus 2 by RT PCR: POSITIVE — AB

## 2022-02-07 LAB — LIPASE, BLOOD: Lipase: 36 U/L (ref 11–51)

## 2022-02-07 LAB — TROPONIN I (HIGH SENSITIVITY)
Troponin I (High Sensitivity): 77 ng/L — ABNORMAL HIGH (ref <18)
Troponin I (High Sensitivity): 84 ng/L — ABNORMAL HIGH (ref <18)

## 2022-02-07 MED ORDER — ACETAMINOPHEN 325 MG PO TABS
650.0000 mg | ORAL_TABLET | Freq: Once | ORAL | Status: AC
Start: 2022-02-07 — End: 2022-02-07
  Administered 2022-02-07: 650 mg via ORAL
  Filled 2022-02-07: qty 2

## 2022-02-07 MED ORDER — PANTOPRAZOLE SODIUM 40 MG PO TBEC
40.0000 mg | DELAYED_RELEASE_TABLET | Freq: Two times a day (BID) | ORAL | Status: DC
  Administered 2022-02-08 – 2022-02-10 (×7): 40 mg via ORAL
  Filled 2022-02-07 (×8): qty 1

## 2022-02-07 MED ORDER — HALOPERIDOL 2 MG PO TABS
2.0000 mg | ORAL_TABLET | Freq: Three times a day (TID) | ORAL | Status: DC
  Administered 2022-02-08 – 2022-02-14 (×20): 2 mg via ORAL
  Filled 2022-02-07 (×21): qty 1

## 2022-02-07 MED ORDER — LACTATED RINGERS IV SOLN: INTRAVENOUS | Status: DC

## 2022-02-07 MED ORDER — PROPRANOLOL HCL 10 MG PO TABS
10.0000 mg | ORAL_TABLET | Freq: Three times a day (TID) | ORAL | Status: DC
  Administered 2022-02-08 – 2022-02-14 (×20): 10 mg via ORAL
  Filled 2022-02-07 (×20): qty 1

## 2022-02-07 MED ORDER — LACTATED RINGERS IV BOLUS
1000.0000 mL | Freq: Once | INTRAVENOUS | Status: AC
  Administered 2022-02-07: 1000 mL via INTRAVENOUS

## 2022-02-07 MED ORDER — ONDANSETRON HCL 4 MG PO TABS: 4.0000 mg | ORAL_TABLET | Freq: Four times a day (QID) | ORAL | Status: DC | PRN

## 2022-02-07 MED ORDER — SODIUM CHLORIDE 0.9 % IV SOLN
100.0000 mg | Freq: Every day | INTRAVENOUS | Status: AC
  Administered 2022-02-08 – 2022-02-09 (×2): 100 mg via INTRAVENOUS
  Filled 2022-02-07 (×2): qty 20

## 2022-02-07 MED ORDER — SODIUM CHLORIDE 0.9% FLUSH
3.0000 mL | Freq: Two times a day (BID) | INTRAVENOUS | Status: DC
  Administered 2022-02-08 – 2022-02-13 (×13): 3 mL via INTRAVENOUS

## 2022-02-07 MED ORDER — ACETAMINOPHEN 325 MG PO TABS: 650.0000 mg | ORAL_TABLET | Freq: Four times a day (QID) | ORAL | Status: DC | PRN

## 2022-02-07 MED ORDER — LACTULOSE 10 GM/15ML PO SOLN
20.0000 g | Freq: Three times a day (TID) | ORAL | Status: DC
  Administered 2022-02-08 – 2022-02-14 (×19): 20 g via ORAL
  Filled 2022-02-07 (×19): qty 30

## 2022-02-07 MED ORDER — GUAIFENESIN-DM 100-10 MG/5ML PO SYRP
10.0000 mL | ORAL_SOLUTION | ORAL | Status: DC | PRN
  Administered 2022-02-08 – 2022-02-13 (×5): 10 mL via ORAL
  Filled 2022-02-07 (×5): qty 10

## 2022-02-07 MED ORDER — QUETIAPINE FUMARATE 200 MG PO TABS
400.0000 mg | ORAL_TABLET | Freq: Two times a day (BID) | ORAL | Status: DC
  Administered 2022-02-08 – 2022-02-14 (×14): 400 mg via ORAL
  Filled 2022-02-07 (×14): qty 2

## 2022-02-07 MED ORDER — LITHIUM CARBONATE 300 MG PO CAPS
300.0000 mg | ORAL_CAPSULE | Freq: Every day | ORAL | Status: DC
  Administered 2022-02-08 – 2022-02-13 (×7): 300 mg via ORAL
  Filled 2022-02-07 (×5): qty 1
  Filled 2022-02-07: qty 2
  Filled 2022-02-07: qty 1

## 2022-02-07 MED ORDER — QUETIAPINE FUMARATE 100 MG PO TABS
100.0000 mg | ORAL_TABLET | Freq: Every day | ORAL | Status: DC
  Administered 2022-02-08 (×2): 100 mg via ORAL
  Filled 2022-02-07 (×2): qty 1

## 2022-02-07 MED ORDER — ONDANSETRON HCL 4 MG/2ML IJ SOLN
4.0000 mg | Freq: Once | INTRAMUSCULAR | Status: AC | PRN
  Administered 2022-02-07: 4 mg via INTRAVENOUS
  Filled 2022-02-07: qty 2

## 2022-02-07 MED ORDER — LITHIUM CARBONATE 150 MG PO CAPS
150.0000 mg | ORAL_CAPSULE | Freq: Every day | ORAL | Status: DC
  Administered 2022-02-08 – 2022-02-14 (×7): 150 mg via ORAL
  Filled 2022-02-07 (×7): qty 1

## 2022-02-07 MED ORDER — BISACODYL 5 MG PO TBEC: 5.0000 mg | DELAYED_RELEASE_TABLET | Freq: Every day | ORAL | Status: DC | PRN

## 2022-02-07 MED ORDER — DIVALPROEX SODIUM ER 500 MG PO TB24
1000.0000 mg | ORAL_TABLET | Freq: Every day | ORAL | Status: DC
  Administered 2022-02-08 – 2022-02-13 (×7): 1000 mg via ORAL
  Filled 2022-02-07 (×7): qty 2

## 2022-02-07 MED ORDER — POLYETHYLENE GLYCOL 3350 17 G PO PACK
17.0000 g | PACK | Freq: Every day | ORAL | Status: DC | PRN
Start: 2022-02-07 — End: 2022-02-14

## 2022-02-07 MED ORDER — HEPARIN SODIUM (PORCINE) 5000 UNIT/ML IJ SOLN
5000.0000 [IU] | Freq: Three times a day (TID) | INTRAMUSCULAR | Status: DC
  Administered 2022-02-08 – 2022-02-14 (×17): 5000 [IU] via SUBCUTANEOUS
  Filled 2022-02-07 (×19): qty 1

## 2022-02-07 MED ORDER — ZOLPIDEM TARTRATE 10 MG PO TABS
10.0000 mg | ORAL_TABLET | Freq: Every day | ORAL | Status: DC
  Administered 2022-02-08 – 2022-02-13 (×7): 10 mg via ORAL
  Filled 2022-02-07 (×7): qty 1

## 2022-02-07 MED ORDER — ONDANSETRON HCL 4 MG/2ML IJ SOLN
4.0000 mg | Freq: Four times a day (QID) | INTRAMUSCULAR | Status: DC | PRN
  Administered 2022-02-09 – 2022-02-11 (×4): 4 mg via INTRAVENOUS
  Filled 2022-02-07 (×4): qty 2

## 2022-02-07 MED ORDER — SODIUM CHLORIDE 0.9 % IV SOLN
200.0000 mg | Freq: Once | INTRAVENOUS | Status: AC
  Administered 2022-02-08: 200 mg via INTRAVENOUS
  Filled 2022-02-07: qty 40

## 2022-02-07 NOTE — H&P (Signed)
History and Physical    Nicholas Oconnell ACZ:[id number] DOB: 1960-08-10 DOA: 02/07/2022  PCP: Neale Burly, MD   Patient coming from: Group home   Chief Complaint: Chills, N/V   HPI: Nicholas Oconnell is a 61 y.o. male with medical history significant for schizoaffective disorder, CKD 3A, hypertension, and chronic constipation with history of Ogilvie syndrome who now presents to the emergency department with chills, nausea, and vomiting.  He is accompanied by a caretaker who assists with the history.  Patient seen to be more fatigued than usual couple days ago but appeared to be in his usual state this morning before developing severe chills and nausea with nonbloody vomiting.  He continued to complain of chills and had continued vomiting, eventually prompting his presentation to the ED.  He denies any chest pain but his caregiver reports that he did complain of chest pain earlier today.   ED Course: Upon arrival to the ED, patient is found to be febrile to 38.3 C and saturating well on room air with slight tachypnea, mild tachycardia, and stable blood pressure.  Blood work notable for creatinine of 2.33 and troponin of 77.  COVID-19 PCR is positive.  Chest x-ray negative for acute cardiopulmonary disease.  CT of the abdomen and pelvis findings are similar to prior.  Blood cultures were collected in the ED and the patient was given a liter of LR and Zofran.  Review of Systems:  All other systems reviewed and apart from HPI, are negative.  Past Medical History:  Diagnosis Date   Anxiety    Bipolar affective ([place])    Cancer ([place])    Chronic kidney disease (CKD)    Cognitive deficits    Hypertension    Seizure [facility])     Past Surgical History:  Procedure Laterality Date   COLONOSCOPY N/A 01/12/2022   Procedure: COLONOSCOPY;  Surgeon: Carol Ada, MD;  Location: [facility] ENDOSCOPY;  Service: Gastroenterology;  Laterality: N/A;    Social History:   reports that he has never smoked. He has  never used smokeless tobacco. He reports that he does not drink alcohol and does not use drugs.  Allergies  Allergen Reactions   Benzodiazepines Other (See Comments)    disinhibition Not on MAR   Clozapine Other (See Comments)    constipation - bowel obstruction Not on MAR    History reviewed. No pertinent family history.   Prior to Admission medications   Medication Sig Start Date End Date Taking? Authorizing Provider  amLODipine (NORVASC) 10 MG tablet Take 10 mg by mouth daily. Patient not taking: Reported on 01/03/2022    [provider]  divalproex (DEPAKOTE ER) 500 MG 24 hr tablet Take 2 tablets (1,000 mg total) by mouth at bedtime. 04/28/21   Isla Pence, MD  Ensure (ENSURE) Take by mouth 2 (two) times daily as needed (if 50% of meal not consumed).    [provider]  haloperidol (HALDOL) 2 MG tablet Take 1 tablet (2 mg total) by mouth 3 (three) times daily. 01/16/22   Aline August, MD  lactulose (CHRONULAC) 10 GM/15ML solution Take 30 mLs (20 g total) by mouth 3 (three) times daily. 01/16/22   Aline August, MD  lithium carbonate 150 MG capsule Take 150-300 mg by mouth See admin instructions. Take 150 mg by mouth every morning. Take 300 mg by mouth at bedtime.    [provider]  Melatonin 10 MG CAPS Take 10 mg by mouth at bedtime.    [provider]  pantoprazole (PROTONIX) 40 MG tablet Take 40 mg by mouth 2 (two) times daily.    [provider]  patiromer (VELTASSA) 8.4 g packet Take 8.4 g by mouth daily.    [provider]  propranolol (INDERAL) 10 MG tablet Take 10 mg by mouth 3 (three) times daily.    [provider]  QUEtiapine (SEROQUEL) 100 MG tablet Take 1 tablet (100 mg total) by mouth at bedtime. Patient not taking: Reported on 01/24/2022 01/16/22   Aline August, MD  QUEtiapine (SEROQUEL) 400 MG tablet Take 400 mg by mouth 2 (two) times daily.    [provider]  simethicone (MYLICON) 885 MG  chewable tablet Chew 125 mg by mouth every 6 (six) hours as needed for flatulence.    [provider]  Sodium Phosphates (ENEMA) ENEM Place 1 Dose rectally daily as needed (constipation).    [provider]  valbenazine (INGREZZA) 40 MG capsule Take 1 capsule (40 mg total) by mouth daily. Patient not taking: Reported on 01/24/2022 01/16/22   Suella Broad, FNP  Vitamin D, Ergocalciferol, (DRISDOL) 1.25 MG (50000 UNIT) CAPS capsule Take 1 capsule (50,000 Units total) by mouth every 7 (seven) days. 01/11/22   Caren Griffins, MD  zolpidem (AMBIEN) 10 MG tablet Take 10 mg by mouth at bedtime. 06/21/20   [provider]    Physical Exam: Vitals:   02/07/22 2255 02/07/22 2330 02/07/22 2340 02/08/22 0033  BP:  108/61  113/70  Pulse:  86  83  Resp:  19  18  Temp: 98.9 F (37.2 C)  98.4 F (36.9 C) 98.5 F (36.9 C)  TempSrc: Oral  Oral Oral  SpO2:  94%  97%    Constitutional: NAD, no diaphoresis, no pallor   Eyes: PERTLA, lids and conjunctivae normal ENMT: Mucous membranes are moist. Posterior pharynx clear of any exudate or lesions.   Neck: supple, no masses  Respiratory: no wheezing, no crackles. No accessory muscle use.  Cardiovascular: S1 & S2 heard, regular rate and rhythm. No extremity edema.   Abdomen: Soft, mild distension, no tenderness. Bowel sounds active.  Musculoskeletal: no clubbing / cyanosis. No joint deformity upper and lower extremities.   Skin: no significant rashes, lesions, ulcers. Warm, dry, well-perfused. Neurologic: CN 2-12 grossly intact. Moving all extremities. Sleeping, wakes to voice.    Psychiatric: Calm. Cooperative.    Labs and Imaging on Admission: I have personally reviewed following labs and imaging studies  CBC: Recent Labs  Lab 02/07/22 2049  WBC 8.5  NEUTROABS 6.2  HGB 13.0  HCT 41.5  MCV 94.1  PLT 027   Basic Metabolic Panel: Recent Labs  Lab 02/07/22 2049  NA 139  K 4.6  CL 106  CO2 20*  GLUCOSE  122*  BUN 25*  CREATININE 2.33*  CALCIUM 8.6*   GFR: CrCl cannot be calculated (Unknown ideal weight.). Liver Function Tests: Recent Labs  Lab 02/07/22 2049  AST 40  ALT 29  ALKPHOS 114  BILITOT 0.6  PROT 8.0  ALBUMIN 3.6   Recent Labs  Lab 02/07/22 2049  LIPASE 36   No results for input(s): "AMMONIA" in the last 168 hours. Coagulation Profile: No results for input(s): "INR", "PROTIME" in the last 168 hours. Cardiac Enzymes: No results for input(s): "CKTOTAL", "CKMB", "CKMBINDEX", "TROPONINI" in the last 168 hours. BNP (last 3 results) No results for input(s): "PROBNP" in the last 8760 hours. HbA1C: No results for input(s): "HGBA1C" in the last 72 hours. CBG: No results for  input(s): "GLUCAP" in the last 168 hours. Lipid Profile: No results for input(s): "CHOL", "HDL", "LDLCALC", "TRIG", "CHOLHDL", "LDLDIRECT" in the last 72 hours. Thyroid Function Tests: No results for input(s): "TSH", "T4TOTAL", "FREET4", "T3FREE", "THYROIDAB" in the last 72 hours. Anemia Panel: No results for input(s): "VITAMINB12", "FOLATE", "FERRITIN", "TIBC", "IRON", "RETICCTPCT" in the last 72 hours. Urine analysis:    Component Value Date/Time   COLORURINE STRAW (A) 01/03/2022 1641   APPEARANCEUR CLEAR 01/03/2022 1641   LABSPEC 1.028 01/03/2022 1641   PHURINE 6.0 01/03/2022 1641   GLUCOSEU NEGATIVE 01/03/2022 1641   HGBUR NEGATIVE 01/03/2022 1641   BILIRUBINUR NEGATIVE 01/03/2022 1641   KETONESUR NEGATIVE 01/03/2022 1641   PROTEINUR NEGATIVE 01/03/2022 1641   NITRITE NEGATIVE 01/03/2022 1641   LEUKOCYTESUR NEGATIVE 01/03/2022 1641   Sepsis Labs: '@LABRCNTIP'$ (procalcitonin:4,lacticidven:4) ) Recent Results (from the past 240 hour(s))  Resp Panel by RT-PCR (Flu A&B, Covid) Anterior Nasal Swab     Status: Abnormal   Collection Time: 02/07/22  8:24 PM   Specimen: Anterior Nasal Swab  Result Value Ref Range Status   SARS Coronavirus 2 by RT PCR POSITIVE (A) NEGATIVE Final    Comment:  (NOTE) SARS-CoV-2 target nucleic acids are DETECTED.  The SARS-CoV-2 RNA is generally detectable in upper respiratory specimens during the acute phase of infection. Positive results are indicative of the presence of the identified virus, but do not rule out bacterial infection or co-infection with other pathogens not detected by the test. Clinical correlation with patient history and other diagnostic information is necessary to determine patient infection status. The expected result is Negative.  Fact Sheet for Patients: EntrepreneurPulse.com.au  Fact Sheet for Healthcare Providers: IncredibleEmployment.be  This test is not yet approved or cleared by the [place] FDA and  has been authorized for detection and/or diagnosis of SARS-CoV-2 by FDA under an Emergency Use Authorization (EUA).  This EUA will remain in effect (meaning this test can be used) for the duration of  the COVID-19 declaration under Section 564(b)(1) of the A ct, 21 U.S.C. section 360bbb-3(b)(1), unless the authorization is terminated or revoked sooner.     Influenza A by PCR NEGATIVE NEGATIVE Final   Influenza B by PCR NEGATIVE NEGATIVE Final    Comment: (NOTE) The Xpert Xpress SARS-CoV-2/FLU/RSV plus assay is intended as an aid in the diagnosis of influenza from Nasopharyngeal swab specimens and should not be used as a sole basis for treatment. Nasal washings and aspirates are unacceptable for Xpert Xpress SARS-CoV-2/FLU/RSV testing.  Fact Sheet for Patients: EntrepreneurPulse.com.au  Fact Sheet for Healthcare Providers: IncredibleEmployment.be  This test is not yet approved or cleared by the [place] FDA and has been authorized for detection and/or diagnosis of SARS-CoV-2 by FDA under an Emergency Use Authorization (EUA). This EUA will remain in effect (meaning this test can be used) for the duration of the COVID-19  declaration under Section 564(b)(1) of the Act, 21 U.S.C. section 360bbb-3(b)(1), unless the authorization is terminated or revoked.  Performed at [facility], [place] [address] [place], [place] [address]      Radiological Exams on Admission: CT ABDOMEN PELVIS WO CONTRAST  Result Date: 02/07/2022 CLINICAL DATA:  Nausea vomiting EXAM: CT ABDOMEN AND PELVIS WITHOUT CONTRAST TECHNIQUE: Multidetector CT imaging of the abdomen and pelvis was performed following the standard protocol without IV contrast. RADIATION DOSE REDUCTION: This exam was performed according to the departmental dose-optimization program which includes automated exposure control, adjustment of the mA and/or kV according to patient size and/or use  of iterative reconstruction technique. COMPARISON:  CT 01/12/2022, 01/03/2022, 06/28/2021 FINDINGS: Lower chest: Lung bases demonstrate minimal posterior foci of consolidation. No pleural effusion. Small hiatal hernia. Hepatobiliary: No focal liver abnormality is seen. No gallstones, gallbladder wall thickening, or biliary dilatation. Pancreas: Unremarkable. No pancreatic ductal dilatation or surrounding inflammatory changes. Spleen: Normal in size without focal abnormality. Adrenals/Urinary Tract: Adrenal glands are normal. Mild bilateral renal collecting system dilatation and prominence of the ureters. Slight bladder distension. Stomach/Bowel: The stomach is nonenlarged. Postsurgical changes of the colon consistent with partial left colectomy and reanastomosis.: Is chronically dilated and fluid-filled. There is no acute bowel wall thickening. Some fluid-filled small bowel but no small bowel distension. Vascular/Lymphatic: Nonaneurysmal aorta.  No suspicious lymph nodes Reproductive: Prostate is unremarkable. Other: Negative for pelvic effusion or free air Musculoskeletal: No acute osseous abnormality IMPRESSION: 1. No convincing evidence for bowel obstruction. Patient is status  post partial left colectomy with reanastomosis. There is air and fluid distension of the entire colon which is similar compared to prior. There is no dilated small bowel or bowel wall thickening at this time. 2. Small foci of posterior lung consolidation could be due to atelectasis or mild aspiration. Electronically Signed   By: Donavan Foil M.D.   On: 02/07/2022 22:32   DG Chest Portable 1 View  Result Date: 02/07/2022 CLINICAL DATA:  Vomiting and chest pain with chills. EXAM: PORTABLE CHEST 1 VIEW COMPARISON:  AP Lat 01/24/2022 FINDINGS: The heart size and mediastinal contours are within normal limits. Both lungs are somewhat hypoinflated but generally clear. The visualized skeletal structures are unremarkable. Left upper abdominal surgical clips are again noted. IMPRESSION: No active disease.  Stable hypoinflated chest. Electronically Signed   By: Telford Nab M.D.   On: 02/07/2022 21:37    EKG: Independently reviewed. SR, T-wave abnormality in septal and lateral leads similar to August 2023.   Assessment/Plan   1. AKI superimposed on CKD IIIa  - Presents with chills and N/V and found to be COVID-positive with SCr of 2.33, up from a baseline of 1.45  - Likely prerenal in setting of N/V and poor appetite  - Continue IVF hydration, renally-dose medications, follow daily chem panels    2. COVID-19 infection  - Presents with chills and N/V and found to be febrile and positive for COVID-19  - He is stable on room air but has clinical and laboratory risk factors for progression to severe disease  - Start remdesivir, continue supportive care, trend markers   3. Schizoaffective disorder  - Continue home medications   4. Chronic constipation, hx of Ogilvie syndrome  - Abdominal exam benign - Continue lactulose    5. Elevated troponin  - Troponin was 77 and then 84 in ED  - Patient denies chest pain in ED but had complained of chest pain earlier in the day per his caregiver  - EKG appears  similar to prior from August 2023  - Continue cardiac monitoring, continue beta-blocker, check echocardiogram    DVT prophylaxis: sq heparin  Code Status: Full  Level of Care: Level of care: Telemetry Family Communication: Caregiver updated at bedside  Disposition Plan:  Patient is from: Group home  Anticipated d/c is to: Group home  Anticipated d/c date is: 02/10/22 Patient currently: pending improved renal function, tolerance of adequate oral intake  Consults called: none Admission status: Inpatient     Vianne Bulls, MD [facility]  02/08/2022, 2:47 AM

## 2022-02-07 NOTE — ED Notes (Signed)
Inpatient RN has not responded to several secure message sent regarding this pt, unit 4E was called, spoke with Jeannene Patella, who stated she will f/u with receiving RN [place] Cummings. ED Charge Lysbeth Galas is aware of delay in transport and handoff report

## 2022-02-07 NOTE — ED Notes (Signed)
Purfit place on pt

## 2022-02-07 NOTE — ED Provider Triage Note (Signed)
Emergency Medicine Provider Triage Evaluation Note  Nicholas Oconnell , [place] 61 y.o. male  was evaluated in triage.  Pt complains of nominal pain, vomiting and not feeling well.  Began today.  He has had chills.  Was at [facility] day program when he started having multiple episodes of NBNB emesis.  Has not eaten much today.  Patient on arrival has fever, tachycardic.  No cough, shortness of breath.  States he is not having bowel movements. No sick contacts  Review of Systems  Positive: Abd pain, fever Negative: cough  Physical Exam  BP (!) 122/96   Pulse (!) 108   Temp 100.3 F (37.9 C) (Oral)   Resp (!) 21   SpO2 96%  Gen:   Awake, no distress   Resp:  Normal effort  MSK:   Moves extremities without difficulty  ABD:  Abdomen distended, tight, tender. large midline old surgical incision surrounding erythema Other:    Medical Decision Making  Medically screening exam initiated at 8:33 PM.  Appropriate orders placed.  Nicholas Oconnell was informed that the remainder of the evaluation will be completed by another provider, this initial triage assessment does not replace that evaluation, and the importance of remaining in the ED until their evaluation is complete.  ABD pain, fever   Nicholas Zern A, PA-C 02/07/22 2035

## 2022-02-07 NOTE — ED Notes (Signed)
Pt was assigned oncology inpatient bed, however pt cannot go to that unit d/t COVID +, a call to bed placement to have a different inpatient bed assigned for this pt. Will await another bed assignment.

## 2022-02-07 NOTE — ED Notes (Signed)
Secure message sent to receiving RN [place] Cummings, awaiting response. Pt has an inpatient bed at this time

## 2022-02-07 NOTE — ED Triage Notes (Signed)
Pt reports with vomiting and chills that started today. Caretaker states that he was at a day program and became sick. Pt has not been eating much today.

## 2022-02-07 NOTE — ED Notes (Signed)
Pt to CT at this time.

## 2022-02-07 NOTE — ED Provider Notes (Addendum)
[facility] DEPT Provider Note   CSN: [id number] Arrival date & time: 02/07/22  2007     History  Chief Complaint  Patient presents with   Emesis   Chills    Nicholas Oconnell is a 61 y.o. male.  HPI 61 year old male presents from the group home with vomiting and chills.  Patient has a history of bipolar disorder, schizoaffective disorder, hypertension, and CKD.  History is primarily from the caregiver from the group home.  Patient has had a couple episodes of vomiting today as well as chills.  He was complaining of chest pain.  He was recently admitted due to severe constipation but that has been a lot better and has been having more regular bowel movements, most recently yesterday.  He had chest pain earlier today but denied abdominal pain.  When he got to triage his temperature was 100.3 but no obvious fever at the facility. Patient currently has no complaints.  Patient is at his mental baseline per caregiver. He has chronic tremors.   Home Medications Prior to Admission medications   Medication Sig Start Date End Date Taking? Authorizing Provider  amLODipine (NORVASC) 10 MG tablet Take 10 mg by mouth daily. Patient not taking: Reported on 01/03/2022    [provider]  divalproex (DEPAKOTE ER) 500 MG 24 hr tablet Take 2 tablets (1,000 mg total) by mouth at bedtime. 04/28/21   Isla Pence, MD  Ensure (ENSURE) Take by mouth 2 (two) times daily as needed (if 50% of meal not consumed).    [provider]  haloperidol (HALDOL) 2 MG tablet Take 1 tablet (2 mg total) by mouth 3 (three) times daily. 01/16/22   Aline August, MD  lactulose (CHRONULAC) 10 GM/15ML solution Take 30 mLs (20 g total) by mouth 3 (three) times daily. 01/16/22   Aline August, MD  lithium carbonate 150 MG capsule Take 150-300 mg by mouth See admin instructions. Take 150 mg by mouth every morning. Take 300 mg by mouth at bedtime.    [provider]  Melatonin 10  MG CAPS Take 10 mg by mouth at bedtime.    [provider]  pantoprazole (PROTONIX) 40 MG tablet Take 40 mg by mouth 2 (two) times daily.    [provider]  patiromer (VELTASSA) 8.4 g packet Take 8.4 g by mouth daily.    [provider]  propranolol (INDERAL) 10 MG tablet Take 10 mg by mouth 3 (three) times daily.    [provider]  QUEtiapine (SEROQUEL) 100 MG tablet Take 1 tablet (100 mg total) by mouth at bedtime. Patient not taking: Reported on 01/24/2022 01/16/22   Aline August, MD  QUEtiapine (SEROQUEL) 400 MG tablet Take 400 mg by mouth 2 (two) times daily.    [provider]  simethicone (MYLICON) 748 MG chewable tablet Chew 125 mg by mouth every 6 (six) hours as needed for flatulence.    [provider]  Sodium Phosphates (ENEMA) ENEM Place 1 Dose rectally daily as needed (constipation).    [provider]  valbenazine (INGREZZA) 40 MG capsule Take 1 capsule (40 mg total) by mouth daily. Patient not taking: Reported on 01/24/2022 01/16/22   Suella Broad, FNP  Vitamin D, Ergocalciferol, (DRISDOL) 1.25 MG (50000 UNIT) CAPS capsule Take 1 capsule (50,000 Units total) by mouth every 7 (seven) days. 01/11/22   Caren Griffins, MD  zolpidem (AMBIEN) 10 MG tablet Take 10 mg by mouth at bedtime. 06/21/20   [provider]      Allergies    Benzodiazepines and Clozapine    Review of Systems   Review of Systems  Unable to perform ROS: Other    Physical Exam Updated Vital Signs BP 124/70   Pulse 87   Temp 100 F (37.8 C) (Oral)   Resp 19   SpO2 96%  Physical Exam Vitals and nursing note reviewed.  Constitutional:      General: He is not in acute distress.    Appearance: He is well-developed. He is not ill-appearing or diaphoretic.  HENT:     Head: Normocephalic and atraumatic.  Cardiovascular:     Rate and Rhythm: Regular rhythm. Tachycardia present.     Heart sounds: Normal heart sounds.   Pulmonary:     Effort: Pulmonary effort is normal.     Breath sounds: Normal breath sounds.  Abdominal:     General: There is no distension.     Palpations: Abdomen is soft.     Tenderness: There is no abdominal tenderness.  Skin:    General: Skin is warm and dry.  Neurological:     Mental Status: He is alert.     Motor: Tremor present.     ED Results / Procedures / Treatments   Labs (all labs ordered are listed, but only abnormal results are displayed) Labs Reviewed  RESP PANEL BY RT-PCR (FLU A&B, COVID) ARPGX2 - Abnormal; Notable for the following components:      Result Value   SARS Coronavirus 2 by RT PCR POSITIVE (*)    All other components within normal limits  CBC WITH DIFFERENTIAL/PLATELET - Abnormal; Notable for the following components:   Monocytes Absolute 1.1 (*)    All other components within normal limits  COMPREHENSIVE METABOLIC PANEL - Abnormal; Notable for the following components:   CO2 20 (*)    Glucose, Bld 122 (*)    BUN 25 (*)    Creatinine, Ser 2.33 (*)    Calcium 8.6 (*)    GFR, Estimated 31 (*)    All other components within normal limits  TROPONIN I (HIGH SENSITIVITY) - Abnormal; Notable for the following components:   Troponin I (High Sensitivity) 77 (*)    All other components within normal limits  CULTURE, BLOOD (ROUTINE X 2)  CULTURE, BLOOD (ROUTINE X 2)  LIPASE, BLOOD  LACTIC ACID, PLASMA  URINALYSIS, ROUTINE W REFLEX MICROSCOPIC  LITHIUM LEVEL  TROPONIN I (HIGH SENSITIVITY)    EKG EKG Interpretation  Date/Time:  Wednesday February 07 2022 23:02:39 EDT Ventricular Rate:  80 PR Interval:  131 QRS Duration: 77 QT Interval:  389 QTC Calculation: 449 R Axis:   53 Text Interpretation: Sinus rhythm Abnormal T, consider ischemia, lateral leads similar to Jan 13 2022 Confirmed by Sherwood Gambler [phone]) on 02/07/2022 11:05:08 PM  Radiology CT ABDOMEN PELVIS WO CONTRAST  Result Date: 02/07/2022 CLINICAL DATA:  Nausea vomiting EXAM: CT  ABDOMEN AND PELVIS WITHOUT CONTRAST TECHNIQUE: Multidetector CT imaging of the abdomen and pelvis was performed following the standard protocol without IV contrast. RADIATION DOSE REDUCTION: This exam was performed according to the departmental dose-optimization program which includes automated exposure control, adjustment of the mA and/or kV according to patient size and/or use of iterative reconstruction technique. COMPARISON:  CT 01/12/2022, 01/03/2022, 06/28/2021 FINDINGS: Lower chest: Lung bases demonstrate minimal posterior foci of consolidation. No pleural effusion. Small hiatal hernia. Hepatobiliary: No focal liver abnormality is seen. No gallstones, gallbladder wall thickening, or biliary dilatation. Pancreas: Unremarkable. No pancreatic  ductal dilatation or surrounding inflammatory changes. Spleen: Normal in size without focal abnormality. Adrenals/Urinary Tract: Adrenal glands are normal. Mild bilateral renal collecting system dilatation and prominence of the ureters. Slight bladder distension. Stomach/Bowel: The stomach is nonenlarged. Postsurgical changes of the colon consistent with partial left colectomy and reanastomosis.: Is chronically dilated and fluid-filled. There is no acute bowel wall thickening. Some fluid-filled small bowel but no small bowel distension. Vascular/Lymphatic: Nonaneurysmal aorta.  No suspicious lymph nodes Reproductive: Prostate is unremarkable. Other: Negative for pelvic effusion or free air Musculoskeletal: No acute osseous abnormality IMPRESSION: 1. No convincing evidence for bowel obstruction. Patient is status post partial left colectomy with reanastomosis. There is air and fluid distension of the entire colon which is similar compared to prior. There is no dilated small bowel or bowel wall thickening at this time. 2. Small foci of posterior lung consolidation could be due to atelectasis or mild aspiration. Electronically Signed   By: Donavan Foil M.D.   On: 02/07/2022  22:32   DG Chest Portable 1 View  Result Date: 02/07/2022 CLINICAL DATA:  Vomiting and chest pain with chills. EXAM: PORTABLE CHEST 1 VIEW COMPARISON:  AP Lat 01/24/2022 FINDINGS: The heart size and mediastinal contours are within normal limits. Both lungs are somewhat hypoinflated but generally clear. The visualized skeletal structures are unremarkable. Left upper abdominal surgical clips are again noted. IMPRESSION: No active disease.  Stable hypoinflated chest. Electronically Signed   By: Telford Nab M.D.   On: 02/07/2022 21:37    Procedures Procedures    Medications Ordered in ED Medications  lactated ringers bolus 1,000 mL (0 mLs Intravenous Stopped 02/07/22 2237)  ondansetron (ZOFRAN) injection 4 mg (4 mg Intravenous Given 02/07/22 2125)  acetaminophen (TYLENOL) tablet 650 mg (650 mg Oral Given 02/07/22 2125)    ED Course/ Medical Decision Making/ A&P                           Medical Decision Making Amount and/or Complexity of Data Reviewed Labs: ordered.    Details: COVID test is positive.  Creatinine of 2.3 is higher than baseline, consistent with AKI.  Troponin of 77 is likely secondary to the above but is abnormal compared to his baseline. Radiology: ordered and independent interpretation performed.    Details: No pneumonia on x-ray.  Appears to have colon dilation similar to but not as bad as last admission ECG/medicine tests: independent interpretation performed.    Details: Unchanged from baseline, no acute ischemia  Risk OTC drugs. Prescription drug management. Decision regarding hospitalization.   Patient presents with transient chest pain that seems to be gone as well as vomiting.  Fever seems to be coming from [place].  Could be the cause of his vomiting as well as his abdominal CT does not show an SBO.  He will probably need recurrent colon cleanout as he did last admission with some enemas.  Otherwise he was given IV fluids and found to have an AKI.  Troponin of 77 is  higher than typical when he comes to the hospital and might just be secondary to acute infection/AKI but I think this will need to be trended and probably an echo.  I doubt PE/ACS. No current chest pain. Discussed with hospitalist, Dr. Myna Hidalgo.        Final Clinical Impression(s) / ED Diagnoses Final diagnoses:  COVID-19  Acute kidney injury ([place])    Rx / DC Orders ED Discharge Orders  None         Sherwood Gambler, MD 02/07/22 2256    Sherwood Gambler, MD 02/07/22 [phone]

## 2022-02-08 ENCOUNTER — Inpatient Hospital Stay (HOSPITAL_COMMUNITY): Payer: Medicaid Other

## 2022-02-08 DIAGNOSIS — K5900 Constipation, unspecified: Secondary | ICD-10-CM | POA: Diagnosis not present

## 2022-02-08 DIAGNOSIS — U071 COVID-19: Secondary | ICD-10-CM | POA: Diagnosis not present

## 2022-02-08 DIAGNOSIS — N179 Acute kidney failure, unspecified: Secondary | ICD-10-CM | POA: Diagnosis not present

## 2022-02-08 DIAGNOSIS — I10 Essential (primary) hypertension: Secondary | ICD-10-CM

## 2022-02-08 DIAGNOSIS — R778 Other specified abnormalities of plasma proteins: Secondary | ICD-10-CM | POA: Diagnosis not present

## 2022-02-08 DIAGNOSIS — F250 Schizoaffective disorder, bipolar type: Secondary | ICD-10-CM | POA: Diagnosis not present

## 2022-02-08 LAB — CBC WITH DIFFERENTIAL/PLATELET
Abs Immature Granulocytes: 0.04 10*3/uL (ref 0.00–0.07)
Basophils Absolute: 0 10*3/uL (ref 0.0–0.1)
Basophils Relative: 0 %
Eosinophils Absolute: 0 10*3/uL (ref 0.0–0.5)
Eosinophils Relative: 0 %
HCT: 36.7 % — ABNORMAL LOW (ref 39.0–52.0)
Hemoglobin: 11.4 g/dL — ABNORMAL LOW (ref 13.0–17.0)
Immature Granulocytes: 1 %
Lymphocytes Relative: 26 %
Lymphs Abs: 1.5 10*3/uL (ref 0.7–4.0)
MCH: 29.7 pg (ref 26.0–34.0)
MCHC: 31.1 g/dL (ref 30.0–36.0)
MCV: 95.6 fL (ref 80.0–100.0)
Monocytes Absolute: 0.8 10*3/uL (ref 0.1–1.0)
Monocytes Relative: 14 %
Neutro Abs: 3.5 10*3/uL (ref 1.7–7.7)
Neutrophils Relative %: 59 %
Platelets: 149 10*3/uL — ABNORMAL LOW (ref 150–400)
RBC: 3.84 MIL/uL — ABNORMAL LOW (ref 4.22–5.81)
RDW: 15.2 % (ref 11.5–15.5)
WBC: 5.9 10*3/uL (ref 4.0–10.5)
nRBC: 0 % (ref 0.0–0.2)

## 2022-02-08 LAB — COMPREHENSIVE METABOLIC PANEL
ALT: 23 U/L (ref 0–44)
AST: 32 U/L (ref 15–41)
Albumin: 2.9 g/dL — ABNORMAL LOW (ref 3.5–5.0)
Alkaline Phosphatase: 89 U/L (ref 38–126)
Anion gap: 10 (ref 5–15)
BUN: 24 mg/dL — ABNORMAL HIGH (ref 8–23)
CO2: 23 mmol/L (ref 22–32)
Calcium: 8.2 mg/dL — ABNORMAL LOW (ref 8.9–10.3)
Chloride: 107 mmol/L (ref 98–111)
Creatinine, Ser: 1.87 mg/dL — ABNORMAL HIGH (ref 0.61–1.24)
GFR, Estimated: 40 mL/min — ABNORMAL LOW (ref >60)
Glucose, Bld: 90 mg/dL (ref 70–99)
Potassium: 4.3 mmol/L (ref 3.5–5.1)
Sodium: 140 mmol/L (ref 135–145)
Total Bilirubin: 0.3 mg/dL (ref 0.3–1.2)
Total Protein: 6.5 g/dL (ref 6.5–8.1)

## 2022-02-08 LAB — URINALYSIS, ROUTINE W REFLEX MICROSCOPIC
Bacteria, UA: NONE SEEN
Bilirubin Urine: NEGATIVE
Glucose, UA: NEGATIVE mg/dL
Hgb urine dipstick: NEGATIVE
Ketones, ur: NEGATIVE mg/dL
Leukocytes,Ua: NEGATIVE
Nitrite: NEGATIVE
Protein, ur: NEGATIVE mg/dL
Specific Gravity, Urine: 1.012 (ref 1.005–1.030)
pH: 6 (ref 5.0–8.0)

## 2022-02-08 LAB — PHOSPHORUS: Phosphorus: 5 mg/dL — ABNORMAL HIGH (ref 2.5–4.6)

## 2022-02-08 LAB — ECHOCARDIOGRAM COMPLETE
AR max vel: 2.35 cm2
AV Area VTI: 1.91 cm2
AV Area mean vel: 2.09 cm2
AV Mean grad: 3 mmHg
AV Peak grad: 3.9 mmHg
Ao pk vel: 0.99 m/s
Area-P 1/2: 2.66 cm2
Height: 68 in
S' Lateral: 2.6 cm

## 2022-02-08 LAB — MAGNESIUM: Magnesium: 2.3 mg/dL (ref 1.7–2.4)

## 2022-02-08 LAB — PROCALCITONIN: Procalcitonin: 0.23 ng/mL

## 2022-02-08 LAB — D-DIMER, QUANTITATIVE
D-Dimer, Quant: 0.57 ug/mL-FEU — ABNORMAL HIGH (ref 0.00–0.50)
D-Dimer, Quant: 1.65 ug/mL-FEU — ABNORMAL HIGH (ref 0.00–0.50)

## 2022-02-08 LAB — FERRITIN: Ferritin: 31 ng/mL (ref 24–336)

## 2022-02-08 LAB — C-REACTIVE PROTEIN
CRP: 3.2 mg/dL — ABNORMAL HIGH (ref <1.0)
CRP: 4.1 mg/dL — ABNORMAL HIGH (ref <1.0)

## 2022-02-08 MED ORDER — VALBENAZINE TOSYLATE 40 MG PO CAPS
40.0000 mg | ORAL_CAPSULE | Freq: Every day | ORAL | Status: DC
  Administered 2022-02-08 – 2022-02-14 (×7): 40 mg via ORAL
  Filled 2022-02-08 (×8): qty 1

## 2022-02-08 MED ORDER — DEXAMETHASONE SODIUM PHOSPHATE 10 MG/ML IJ SOLN
6.0000 mg | INTRAMUSCULAR | Status: DC
Start: 2022-02-08 — End: 2022-02-14
  Administered 2022-02-08 – 2022-02-14 (×7): 6 mg via INTRAVENOUS
  Filled 2022-02-08 (×7): qty 1

## 2022-02-08 MED ORDER — FLEET ENEMA 7-19 GM/118ML RE ENEM
ENEMA | Freq: Every day | RECTAL | Status: DC | PRN
Start: 2022-02-08 — End: 2022-02-14
  Administered 2022-02-11 – 2022-02-12 (×2): 1 via RECTAL
  Filled 2022-02-08 (×4): qty 1

## 2022-02-08 MED ORDER — SENNOSIDES-DOCUSATE SODIUM 8.6-50 MG PO TABS
1.0000 | ORAL_TABLET | Freq: Two times a day (BID) | ORAL | Status: DC
  Administered 2022-02-08 – 2022-02-11 (×7): 1 via ORAL
  Filled 2022-02-08 (×7): qty 1

## 2022-02-08 MED ORDER — SODIUM CHLORIDE 0.9 % IV SOLN: INTRAVENOUS | Status: DC

## 2022-02-08 MED ORDER — POLYETHYLENE GLYCOL 3350 17 G PO PACK
17.0000 g | PACK | Freq: Two times a day (BID) | ORAL | Status: DC
  Administered 2022-02-08 – 2022-02-14 (×13): 17 g via ORAL
  Filled 2022-02-08 (×14): qty 1

## 2022-02-08 NOTE — Consult Note (Signed)
Psych consult placed the patient on multiple psych medications.  Upon chart review patient's home medications have been resumed with the exception of Ingrezza.  Primary team was unable to restart Ingrezza, and has deferred to psychiatry consult service.   -Recommend continuing current home medications.   Will restart home medication of Ingrezza 40 mg p.o. daily.  Patient did appear to be responsive with good therapeutic effect on last admission.  -Review of labs lithium level within normal limits, creatinine level slightly elevated at 1.87, trending up from 1.26 at discharge.    -Recommend ordering valproic acid level.  -No acute inpatient psychiatric needs identified that warrant ongoing follow-up at this time.   Psychiatry consult service to sign off at this time.

## 2022-02-08 NOTE — Progress Notes (Signed)
PROGRESS NOTE    Nicholas Oconnell  IRJ:[id number] DOB: [birth year] DOA: 02/07/2022 PCP: Neale Burly, MD   Brief Narrative:  61 y.o. 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  He presented on 02/07/2022 with chills and nausea and vomiting.  On presentation, he was febrile, slightly tachypneic, tachycardic.  Creatinine was 2.33, troponin 77, COVID-19 PCR was positive.  Chest x-ray negative for acute cardiopulmonary disease.  CT of abdomen and pelvis findings were similar to prior.  He was started on IV fluids, remdesivir.  Assessment & Plan:   Acute kidney injury on chronic kidney disease stage IIIa -Possibly prerenal from nausea and vomiting.  Baseline creatinine of 1.45.  Presented with creatinine of 2.33. -Improving with IV fluids.  Creatinine 1.87 today. -Repeat a.m. labs.  Continue IV fluids.  COVID-19 infection -COVID-19 Labs  Recent Labs    02/07/22 2346 02/08/22 0443  DDIMER 0.57* 1.65*  FERRITIN  --  31  CRP 3.2* 4.1*    Lab Results  Component Value Date   SARSCOV2NAA POSITIVE (A) 02/07/2022   [place] NEGATIVE 06/28/2021   -Currently on room air.  Monitor daily inflammatory markers.  Currently on remdesivir.  We will also start Decadron as CRP is trending upwards. -Continue isolation  Chronic constipation, history of Ogilvie syndrome -Recent admission for acute on chronic constipation eventually requiring colonoscopy -Exam benign.  Continue bowel regimen.  Add MiraLAX and Senokot  Schizoaffective disorder/cognitive impairment -Patient was seen by psychiatry during last admission and medication changes were made.  Currently not on  valbenazine which was prescribed upon discharge by psychiatry.  Will consult psychiatry for the same.  Continue  lithium/Seroquel/divalproex/Haldol  Elevated troponin From troponin 77 and subsequently 84.  No chest pain.  EKG showed no changes.  Follow 2D echo.  Hypertension - blood pressure on the lower side.  Continue propranolol  Physical deconditioning -PT eval   DVT prophylaxis: Heparin Code Status: Full Family Communication: None at bedside Disposition Plan: Status is: Inpatient Remains inpatient appropriate because: Of severity of illness  Consultants: Consult psychiatry  Procedures: None  Antimicrobials: None   Subjective: Patient seen and examined at bedside.  Poor historian.  Wakes up slightly, hardly answers any questions.  No chest pain, vomiting reported.  Had fever overnight.  Objective: Vitals:   02/08/22 0033 02/08/22 0109 02/08/22 0348 02/08/22 0827  BP: 113/70  103/73 123/83  Pulse: 83  69 65  Resp: '18  16 16  '$ Temp: 98.5 F (36.9 C)  98.6 F (37 C) 98 F (36.7 C)  TempSrc: Oral  Oral Oral  SpO2: 97%  97% 97%  Height:  '5\' 8"'$  (1.727 m)      Intake/Output Summary (Last 24 hours) at 02/08/2022 1025 Last data filed at 02/08/2022 3016 Gross per 24 hour  Intake 2042.01 ml  Output 650 ml  Net 1392.01 ml   Filed Weights    Examination:  General exam: Appears calm and comfortable.  Looks chronically ill and deconditioned.  Currently on room air. Respiratory system: Bilateral decreased breath sounds at bases with some scattered crackles Cardiovascular system: S1 & S2 heard, Rate controlled Gastrointestinal system: Abdomen is slightly distended, soft and nontender. Normal bowel sounds heard. Extremities: No cyanosis, clubbing, edema  Central nervous system: Sleepy, wakes up slightly, does  not participate in conversation much.  No focal neurological deficits. Moving extremities Skin: No rashes, lesions or ulcers Psychiatry: Flat affect.  Not agitated  currently.  Data Reviewed: I have personally reviewed following labs and imaging studies  CBC: Recent Labs  Lab 02/07/22 2049 02/08/22 0443  WBC 8.5 5.9  NEUTROABS 6.2 3.5  HGB 13.0 11.4*  HCT 41.5 36.7*  MCV 94.1 95.6  PLT 206 694*   Basic Metabolic Panel: Recent Labs  Lab 02/07/22 2049 02/08/22 0443  NA 139 140  K 4.6 4.3  CL 106 107  CO2 20* 23  GLUCOSE 122* 90  BUN 25* 24*  CREATININE 2.33* 1.87*  CALCIUM 8.6* 8.2*  MG  --  2.3  PHOS  --  5.0*   GFR: CrCl cannot be calculated (Unknown ideal weight.). Liver Function Tests: Recent Labs  Lab 02/07/22 2049 02/08/22 0443  AST 40 32  ALT 29 23  ALKPHOS 114 89  BILITOT 0.6 0.3  PROT 8.0 6.5  ALBUMIN 3.6 2.9*   Recent Labs  Lab 02/07/22 2049  LIPASE 36   No results for input(s): "AMMONIA" in the last 168 hours. Coagulation Profile: No results for input(s): "INR", "PROTIME" in the last 168 hours. Cardiac Enzymes: No results for input(s): "CKTOTAL", "CKMB", "CKMBINDEX", "TROPONINI" in the last 168 hours. BNP (last 3 results) No results for input(s): "PROBNP" in the last 8760 hours. HbA1C: No results for input(s): "HGBA1C" in the last 72 hours. CBG: No results for input(s): "GLUCAP" in the last 168 hours. Lipid Profile: No results for input(s): "CHOL", "HDL", "LDLCALC", "TRIG", "CHOLHDL", "LDLDIRECT" in the last 72 hours. Thyroid Function Tests: No results for input(s): "TSH", "T4TOTAL", "FREET4", "T3FREE", "THYROIDAB" in the last 72 hours. Anemia Panel: Recent Labs    02/08/22 0443  FERRITIN 31   Sepsis Labs: Recent Labs  Lab 02/07/22 2049 02/07/22 2346  PROCALCITON  --  0.23  LATICACIDVEN 1.5  --     Recent Results (from the past 240 hour(s))  Resp Panel by RT-PCR (Flu A&B, Covid) Anterior Nasal Swab     Status: Abnormal   Collection Time: 02/07/22  8:24 PM   Specimen: Anterior Nasal Swab  Result Value Ref Range Status   SARS Coronavirus 2 by RT PCR POSITIVE (A) NEGATIVE Final     Comment: (NOTE) SARS-CoV-2 target nucleic acids are DETECTED.  The SARS-CoV-2 RNA is generally detectable in upper respiratory specimens during the acute phase of infection. Positive results are indicative of the presence of the identified virus, but do not rule out bacterial infection or co-infection with other pathogens not detected by the test. Clinical correlation with patient history and other diagnostic information is necessary to determine patient infection status. The expected result is Negative.  Fact Sheet for Patients: EntrepreneurPulse.com.au  Fact Sheet for Healthcare Providers: IncredibleEmployment.be  This test is not yet approved or cleared by the [place] FDA and  has been authorized for detection and/or diagnosis of SARS-CoV-2 by FDA under an Emergency Use Authorization (EUA).  This EUA will remain in effect (meaning this test can be used) for the duration of  the COVID-19 declaration under Section 564(b)(1) of the A ct, 21 U.S.C. section 360bbb-3(b)(1), unless the authorization is terminated or revoked sooner.     Influenza A by PCR NEGATIVE NEGATIVE Final   Influenza B by PCR NEGATIVE NEGATIVE Final    Comment: (NOTE) The Xpert Xpress SARS-CoV-2/FLU/RSV plus assay is intended as an aid in the diagnosis of influenza from Nasopharyngeal swab specimens and  should not be used as a sole basis for treatment. Nasal washings and aspirates are unacceptable for Xpert Xpress SARS-CoV-2/FLU/RSV testing.  Fact Sheet for Patients: EntrepreneurPulse.com.au  Fact Sheet for Healthcare Providers: IncredibleEmployment.be  This test is not yet approved or cleared by the [place] FDA and has been authorized for detection and/or diagnosis of SARS-CoV-2 by FDA under an Emergency Use Authorization (EUA). This EUA will remain in effect (meaning this test can be used) for the duration of  the COVID-19 declaration under Section 564(b)(1) of the Act, 21 U.S.C. section 360bbb-3(b)(1), unless the authorization is terminated or revoked.  Performed at [facility] [place], [place] [address] [place], [place] [address]   Blood culture (routine x 2)     Status: None (Preliminary result)   Collection Time: 02/07/22  8:39 PM   Specimen: BLOOD  Result Value Ref Range Status   Specimen Description   Final    BLOOD BLOOD RIGHT ARM Performed at [facility] [address], [place], [place] [address]    Special Requests   Final    BOTTLES DRAWN AEROBIC AND ANAEROBIC Blood Culture results may not be optimal due to an inadequate volume of blood received in culture bottles Performed at [facility] [address] [place], [place] [address]    Culture   Final    NO GROWTH < 12 HOURS Performed at [facility] [address] [place], [place] [address]    Report Status PENDING  Incomplete  Blood culture (routine x 2)     Status: None (Preliminary result)   Collection Time: 02/07/22  8:49 PM   Specimen: BLOOD  Result Value Ref Range Status   Specimen Description   Final    BLOOD BLOOD LEFT ARM Performed at [facility] [address] [place], [place] [address]    Special Requests   Final    BOTTLES DRAWN AEROBIC AND ANAEROBIC Blood Culture results may not be optimal due to an inadequate volume of blood received in culture bottles Performed at [facility] [address], [place], [place] [address]    Culture   Final    NO GROWTH < 12 HOURS Performed at [facility] [address] [place], [place] [address]    Report Status PENDING  Incomplete         Radiology Studies: CT ABDOMEN PELVIS WO CONTRAST  Result Date: 02/07/2022 CLINICAL DATA:  Nausea vomiting EXAM: CT ABDOMEN AND PELVIS WITHOUT CONTRAST TECHNIQUE: Multidetector CT imaging of the abdomen and pelvis was  performed following the standard protocol without IV contrast. RADIATION DOSE REDUCTION: This exam was performed according to the departmental dose-optimization program which includes automated exposure control, adjustment of the mA and/or kV according to patient size and/or use of iterative reconstruction technique. COMPARISON:  CT 01/12/2022, 01/03/2022, 06/28/2021 FINDINGS: Lower chest: Lung bases demonstrate minimal posterior foci of consolidation. No pleural effusion. Small hiatal hernia. Hepatobiliary: No focal liver abnormality is seen. No gallstones, gallbladder wall thickening, or biliary dilatation. Pancreas: Unremarkable. No pancreatic ductal dilatation or surrounding inflammatory changes. Spleen: Normal in size without focal abnormality. Adrenals/Urinary Tract: Adrenal glands are normal. Mild bilateral renal collecting system dilatation and prominence of the ureters. Slight bladder distension. Stomach/Bowel: The stomach is nonenlarged. Postsurgical changes of the colon consistent with partial left colectomy and reanastomosis.: Is chronically dilated and fluid-filled. There is no acute bowel wall thickening. Some fluid-filled small bowel but no small bowel distension. Vascular/Lymphatic:  Nonaneurysmal aorta.  No suspicious lymph nodes Reproductive: Prostate is unremarkable. Other: Negative for pelvic effusion or free air Musculoskeletal: No acute osseous abnormality IMPRESSION: 1. No convincing evidence for bowel obstruction. Patient is status post partial left colectomy with reanastomosis. There is air and fluid distension of the entire colon which is similar compared to prior. There is no dilated small bowel or bowel wall thickening at this time. 2. Small foci of posterior lung consolidation could be due to atelectasis or mild aspiration. Electronically Signed   By: Donavan Foil M.D.   On: 02/07/2022 22:32   DG Chest Portable 1 View  Result Date: 02/07/2022 CLINICAL DATA:  Vomiting and chest pain  with chills. EXAM: PORTABLE CHEST 1 VIEW COMPARISON:  AP Lat 01/24/2022 FINDINGS: The heart size and mediastinal contours are within normal limits. Both lungs are somewhat hypoinflated but generally clear. The visualized skeletal structures are unremarkable. Left upper abdominal surgical clips are again noted. IMPRESSION: No active disease.  Stable hypoinflated chest. Electronically Signed   By: Telford Nab M.D.   On: 02/07/2022 21:37        Scheduled Meds:  divalproex  1,000 mg Oral QHS   haloperidol  2 mg Oral TID   heparin  5,000 Units Subcutaneous Q8H   lactulose  20 g Oral TID   lithium carbonate  150 mg Oral Daily   lithium carbonate  300 mg Oral QHS   pantoprazole  40 mg Oral BID   propranolol  10 mg Oral TID   QUEtiapine  100 mg Oral QHS   QUEtiapine  400 mg Oral BID   sodium chloride flush  3 mL Intravenous Q12H   zolpidem  10 mg Oral QHS   Continuous Infusions:  remdesivir 100 mg in sodium chloride 0.9 % 100 mL IVPB 100 mg (02/08/22 0934)          Aline August, MD [facility] 02/08/2022, 10:25 AM

## 2022-02-08 NOTE — ED Notes (Signed)
Dr. Myna Hidalgo with inpatient admitting at th bedside, another secure message sent to receiving RN, [place] Cummings has not acknowledge or responded to message, charge Lysbeth Galas is aware

## 2022-02-08 NOTE — Progress Notes (Signed)
ED RN ready to receive patient handoff complete at 2339 for Lucia Bitter, RN

## 2022-02-08 NOTE — ED Notes (Signed)
Per Charge RN Lysbeth Galas, she spoke with charge RN with inpatient unit, stated to proceed with bringing pt to inpatient unit 1425. ER Tech to transport pt to inpatient unit w/cardiac monitoring at this time.

## 2022-02-09 ENCOUNTER — Inpatient Hospital Stay (HOSPITAL_COMMUNITY): Payer: Medicaid Other

## 2022-02-09 DIAGNOSIS — U071 COVID-19: Secondary | ICD-10-CM | POA: Diagnosis not present

## 2022-02-09 DIAGNOSIS — N179 Acute kidney failure, unspecified: Secondary | ICD-10-CM | POA: Diagnosis not present

## 2022-02-09 DIAGNOSIS — F250 Schizoaffective disorder, bipolar type: Secondary | ICD-10-CM | POA: Diagnosis not present

## 2022-02-09 DIAGNOSIS — K5900 Constipation, unspecified: Secondary | ICD-10-CM | POA: Diagnosis not present

## 2022-02-09 LAB — COMPREHENSIVE METABOLIC PANEL
ALT: 21 U/L (ref 0–44)
AST: 26 U/L (ref 15–41)
Albumin: 2.9 g/dL — ABNORMAL LOW (ref 3.5–5.0)
Alkaline Phosphatase: 89 U/L (ref 38–126)
Anion gap: 7 (ref 5–15)
BUN: 20 mg/dL (ref 8–23)
CO2: 27 mmol/L (ref 22–32)
Calcium: 8.5 mg/dL — ABNORMAL LOW (ref 8.9–10.3)
Chloride: 110 mmol/L (ref 98–111)
Creatinine, Ser: 1.31 mg/dL — ABNORMAL HIGH (ref 0.61–1.24)
GFR, Estimated: >60 mL/min (ref >60)
Glucose, Bld: 93 mg/dL (ref 70–99)
Potassium: 5.1 mmol/L (ref 3.5–5.1)
Sodium: 144 mmol/L (ref 135–145)
Total Bilirubin: 0.5 mg/dL (ref 0.3–1.2)
Total Protein: 6.8 g/dL (ref 6.5–8.1)

## 2022-02-09 LAB — CBC WITH DIFFERENTIAL/PLATELET
Abs Immature Granulocytes: 0.03 10*3/uL (ref 0.00–0.07)
Basophils Absolute: 0 10*3/uL (ref 0.0–0.1)
Basophils Relative: 0 %
Eosinophils Absolute: 0 10*3/uL (ref 0.0–0.5)
Eosinophils Relative: 0 %
HCT: 40.8 % (ref 39.0–52.0)
Hemoglobin: 12.5 g/dL — ABNORMAL LOW (ref 13.0–17.0)
Immature Granulocytes: 1 %
Lymphocytes Relative: 19 %
Lymphs Abs: 1.2 10*3/uL (ref 0.7–4.0)
MCH: 29.1 pg (ref 26.0–34.0)
MCHC: 30.6 g/dL (ref 30.0–36.0)
MCV: 95.1 fL (ref 80.0–100.0)
Monocytes Absolute: 0.6 10*3/uL (ref 0.1–1.0)
Monocytes Relative: 9 %
Neutro Abs: 4.6 10*3/uL (ref 1.7–7.7)
Neutrophils Relative %: 71 %
Platelets: 202 10*3/uL (ref 150–400)
RBC: 4.29 MIL/uL (ref 4.22–5.81)
RDW: 14.8 % (ref 11.5–15.5)
WBC: 6.5 10*3/uL (ref 4.0–10.5)
nRBC: 0 % (ref 0.0–0.2)

## 2022-02-09 LAB — FERRITIN: Ferritin: 42 ng/mL (ref 24–336)

## 2022-02-09 LAB — VALPROIC ACID LEVEL: Valproic Acid Lvl: 56 ug/mL (ref 50.0–100.0)

## 2022-02-09 LAB — D-DIMER, QUANTITATIVE: D-Dimer, Quant: 0.39 ug/mL-FEU (ref 0.00–0.50)

## 2022-02-09 LAB — C-REACTIVE PROTEIN: CRP: 6.2 mg/dL — ABNORMAL HIGH (ref <1.0)

## 2022-02-09 NOTE — Evaluation (Signed)
Physical Therapy Evaluation Patient Details Name: Nicholas Oconnell MRN: [id number] DOB: 1960/08/01 Today's Date: 02/09/2022  History of Present Illness  61 y.o. male with medical history significant for schizoaffective disorder, CKD 3A, hypertension, RCC status post partial right nephrectomy, chronic constipation with history of Ogilvie syndrome with recent hospitalization from 8-23-8/15/2023  Clinical Impression  Pt admitted with above diagnosis.  Pt known to me from previous admission. Nicholas Oconnell is cooperative with PT, amb hallway distance with mod assist for balance and stability. Fatigues easily however VSS. Will continue to follow in acute setting  Pt currently with functional limitations due to the deficits listed below (see PT Problem List). Pt will benefit from skilled PT to increase their independence and safety with mobility to allow discharge to the venue listed below.          Recommendations for follow up therapy are one component of a multi-disciplinary discharge planning process, led by the attending physician.  Recommendations may be updated based on patient status, additional functional criteria and insurance authorization.  Follow Up Recommendations No PT follow up      Assistance Recommended at Discharge Frequent or constant Supervision/Assistance  Patient can return home with the following  A little help with walking and/or transfers;A little help with bathing/dressing/bathroom;Assistance with cooking/housework;Assist for transportation;Help with stairs or ramp for entrance;Direct supervision/assist for financial management;Direct supervision/assist for medications management    Equipment Recommendations None recommended by PT  Recommendations for Other Services       Functional Status Assessment Patient has had a recent decline in their functional status and demonstrates the ability to make significant improvements in function in a reasonable and predictable amount of time.      Precautions / Restrictions Precautions Precautions: Fall      Mobility  Bed Mobility Overal bed mobility: Needs Assistance Bed Mobility: Supine to Sit     Supine to sit: Mod assist     General bed mobility comments: assist to progress LEs off bed and elevate trunk, incr time    Transfers Overall transfer level: Needs assistance Equipment used: Rolling walker (2 wheels) Transfers: Sit to/from Stand Sit to Stand: Mod assist, +2 safety/equipment, +2 physical assistance           General transfer comment: assist with superior wt shift, to extend knees and hips; once in full standing pt able to support wt with steadying assist    Ambulation/Gait Ambulation/Gait assistance: +2 safety/equipment, Min assist, Mod assist Gait Distance (Feet): 60 Feet Assistive device: Rollator (4 wheels) Gait Pattern/deviations: Step-through pattern, Decreased stride length, Trunk flexed, Knee flexed in stance - left, Knee flexed in stance - right       General Gait Details: pt with ataxic LE  movement, has to use brakes on rollator for control of gait speed; (decr fesinating type gait compared to last admission in Aug). fatigues after above distance requiring seated rest. VSS, SpO2=100% on RA  Stairs            Wheelchair Mobility    Modified Rankin (Stroke Patients Only)       Balance Overall balance assessment: Needs assistance Sitting-balance support: Feet supported, No upper extremity supported, Single extremity supported Sitting balance-Leahy Scale: Fair   Postural control: Posterior lean Standing balance support: Reliant on assistive device for balance, During functional activity Standing balance-Leahy Scale: Poor Standing balance comment: reliant on UE support and external assist  Pertinent Vitals/Pain Pain Assessment Pain Assessment: No/denies pain    Home Living Family/patient expects to be discharged to:: Group home                    Additional Comments: Pt unable to provide reliable information regarding group home.    Prior Function Prior Level of Function : Patient poor historian/Family not available             Mobility Comments: (pt known to me from previous admission)--pt is unable to elaborate however he apparently ambulates with some assist with rollator per info from last visit       Hand Dominance        Extremity/Trunk Assessment   Upper Extremity Assessment Upper Extremity Assessment: Defer to OT evaluation    Lower Extremity Assessment Lower Extremity Assessment: Generalized weakness;RLE deficits/detail;LLE deficits/detail RLE Deficits / Details: bil LE grossly 3+/5, AROM WFL RLE Coordination: decreased fine motor LLE Coordination: decreased fine motor    Cervical / Trunk Assessment Cervical / Trunk Assessment: Normal  Communication   Communication: No difficulties  Cognition Arousal/Alertness: Awake/alert Behavior During Therapy: WFL for tasks assessed/performed Overall Cognitive Status: History of cognitive impairments - at baseline                                          General Comments      Exercises     Assessment/Plan    PT Assessment Patient needs continued PT services  PT Problem List         PT Treatment Interventions DME instruction;Therapeutic exercise;Gait training;Functional mobility training;Therapeutic activities;Patient/family education;Balance training    PT Goals (Current goals can be found in the Care Plan section)  Acute Rehab PT Goals PT Goal Formulation: With patient Time For Goal Achievement: 02/23/22 Potential to Achieve Goals: Good    Frequency Min 3X/week     Co-evaluation               AM-PAC PT "6 Clicks" Mobility  Outcome Measure Help needed turning from your back to your side while in a flat bed without using bedrails?: A Lot Help needed moving from lying on your back to sitting on  the side of a flat bed without using bedrails?: A Lot Help needed moving to and from a bed to a chair (including a wheelchair)?: A Lot Help needed standing up from a chair using your arms (e.g., wheelchair or bedside chair)?: A Lot Help needed to walk in hospital room?: A Lot Help needed climbing 3-5 steps with a railing? : A Lot 6 Click Score: 12    End of Session Equipment Utilized During Treatment: Gait belt Activity Tolerance: Patient tolerated treatment well;Patient limited by fatigue Patient left: in chair;with call bell/phone within reach;with chair alarm set   PT Visit Diagnosis: Other abnormalities of gait and mobility (R26.89);Difficulty in walking, not elsewhere classified (R26.2)    Time: 4259-5638 PT Time Calculation (min) (ACUTE ONLY): 24 min   Charges:   PT Evaluation $PT Eval Low Complexity: 1 Low PT Treatments $Gait Training: 8-22 mins        Baxter Flattery, PT  Acute Rehab Dept [facility]) [phone]  [facility] Weekend Pager [facility] only)  [phone]  02/09/2022   [facility] 02/09/2022, 3:43 PM

## 2022-02-09 NOTE — Progress Notes (Signed)
PROGRESS NOTE    Nicholas Oconnell  ENI:[id number] DOB: [birth year] DOA: 02/07/2022 PCP: Neale Burly, MD   Brief Narrative:  61 y.o. 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  He presented on 02/07/2022 with chills and nausea and vomiting.  On presentation, he was febrile, slightly tachypneic, tachycardic.  Creatinine was 2.33, troponin 77, COVID-19 PCR was positive.  Chest x-ray negative for acute cardiopulmonary disease.  CT of abdomen and pelvis findings were similar to prior.  He was started on IV fluids, remdesivir.  Assessment & Plan:   Acute kidney injury on chronic kidney disease stage IIIa -Possibly prerenal from nausea and vomiting.  Baseline creatinine of 1.45.  Presented with creatinine of 2.33. -Improving with IV fluids.  Creatinine 1.31 today. -Repeat a.m. labs.  Decrease IV fluids to 50 cc an hour  COVID-19 infection -COVID-19 Labs  Recent Labs    02/07/22 2346 02/08/22 0443 02/09/22 0405  DDIMER 0.57* 1.65* 0.39  FERRITIN  --  31 42  CRP 3.2* 4.1*  --      Lab Results  Component Value Date   SARSCOV2NAA POSITIVE (A) 02/07/2022   [place] NEGATIVE 06/28/2021   -Currently on room air.  Monitor daily inflammatory markers.  Currently on remdesivir and Decadron -Continue isolation  Chronic constipation, history of Ogilvie syndrome -Recent admission for acute on chronic constipation eventually requiring colonoscopy -Abdominal looks more distended today.  Check abdominal x-ray.  Continue current bowel regimen.  Schizoaffective disorder/cognitive impairment -Patient was seen by psychiatry during last admission and medication changes  were made.  Psychiatry has resumed valbenazine.  Continue  lithium/Seroquel/divalproex/Haldol.  Outpatient follow-up with psychiatry  Elevated troponin From troponin 77 and subsequently 84.  No chest pain.  EKG showed no changes.  2D echo showed EF of 55 to 60% and grade 1 diastolic dysfunction.  No further work-up needed.  Outpatient follow-up with cardiology if needed.  Hypertension - blood pressure improving.  Continue propranolol  Physical deconditioning -PT eval   DVT prophylaxis: Heparin Code Status: Full Family Communication: None at bedside Disposition Plan: Status is: Inpatient Remains inpatient appropriate because: Of severity of illness  Consultants: psychiatry  Procedures: 2D echo  Antimicrobials: None   Subjective: Patient seen and examined at bedside.  Poor historian.  No seizures, agitation, vomiting reported.  No fevers reported. Objective: Vitals:   02/08/22 0827 02/08/22 1318 02/08/22 2043 02/09/22 0408  BP: 123/83 123/88 119/72 (!) 138/94  Pulse: 65 65 77 78  Resp: '16 16 18 18  '$ Temp: 98 F (36.7 C) 97.9 F (36.6 C) 98.7 F (37.1 C) 98.6 F (37 C)  TempSrc: Oral Oral Oral Oral  SpO2: 97% 100% 96% 97%  Weight:      Height:        Intake/Output Summary (Last 24 hours) at 02/09/2022 0758 Last data filed at 02/09/2022 0600 Gross per 24 hour  Intake 2032.92 ml  Output 1650 ml  Net 382.92 ml    Filed Weights   02/08/22 0109  Weight: 64.8 kg    Examination:  General: On room air.  No distress.  Chronically ill and deconditioned looking. ENT/neck: No thyromegaly.  JVD is not elevated  respiratory: Decreased breath sounds at bases bilaterally with some crackles; no wheezing  CVS: S1-S2 heard, rate controlled  currently Abdominal: Soft, nontender, looks more distended today; no organomegaly, normal bowel sounds are heard Extremities: Trace lower extremity edema; no cyanosis  CNS: Awake but slow to respond.  Poor historian.  No focal neurologic  deficit.  Moves extremities Lymph: No obvious lymphadenopathy Skin: No obvious ecchymosis/lesions  psych: Extremely flat affect.  Not agitated currently.  Musculoskeletal: No obvious joint swelling/deformity   Data Reviewed: I have personally reviewed following labs and imaging studies  CBC: Recent Labs  Lab 02/07/22 2049 02/08/22 0443 02/09/22 0405  WBC 8.5 5.9 6.5  NEUTROABS 6.2 3.5 4.6  HGB 13.0 11.4* 12.5*  HCT 41.5 36.7* 40.8  MCV 94.1 95.6 95.1  PLT 206 149* 962    Basic Metabolic Panel: Recent Labs  Lab 02/07/22 2049 02/08/22 0443 02/09/22 0405  NA 139 140 144  K 4.6 4.3 5.1  CL 106 107 110  CO2 20* 23 27  GLUCOSE 122* 90 93  BUN 25* 24* 20  CREATININE 2.33* 1.87* 1.31*  CALCIUM 8.6* 8.2* 8.5*  MG  --  2.3  --   PHOS  --  5.0*  --     GFR: Estimated Creatinine Clearance: 53.6 mL/min (A) (by C-G formula based on SCr of 1.31 mg/dL (H)). Liver Function Tests: Recent Labs  Lab 02/07/22 2049 02/08/22 0443 02/09/22 0405  AST 40 32 26  ALT '29 23 21  '$ ALKPHOS 114 89 89  BILITOT 0.6 0.3 0.5  PROT 8.0 6.5 6.8  ALBUMIN 3.6 2.9* 2.9*    Recent Labs  Lab 02/07/22 2049  LIPASE 36    No results for input(s): "AMMONIA" in the last 168 hours. Coagulation Profile: No results for input(s): "INR", "PROTIME" in the last 168 hours. Cardiac Enzymes: No results for input(s): "CKTOTAL", "CKMB", "CKMBINDEX", "TROPONINI" in the last 168 hours. BNP (last 3 results) No results for input(s): "PROBNP" in the last 8760 hours. HbA1C: No results for input(s): "HGBA1C" in the last 72 hours. CBG: No results for input(s): "GLUCAP" in the last 168 hours. Lipid Profile: No results for input(s): "CHOL", "HDL", "LDLCALC", "TRIG", "CHOLHDL", "LDLDIRECT" in the last 72 hours. Thyroid Function Tests: No results for input(s): "TSH", "T4TOTAL", "FREET4", "T3FREE", "THYROIDAB" in the last 72 hours. Anemia Panel: Recent Labs    02/08/22 0443 02/09/22 0405  FERRITIN 31 42     Sepsis Labs: Recent Labs  Lab 02/07/22 2049 02/07/22 2346  PROCALCITON  --  0.23  LATICACIDVEN 1.5  --      Recent Results (from the past 240 hour(s))  Resp Panel by RT-PCR (Flu A&B, Covid) Anterior Nasal Swab     Status: Abnormal   Collection Time: 02/07/22  8:24 PM   Specimen: Anterior Nasal Swab  Result Value Ref Range Status   SARS Coronavirus 2 by RT PCR POSITIVE (A) NEGATIVE Final    Comment: (NOTE) SARS-CoV-2 target nucleic acids are DETECTED.  The SARS-CoV-2 RNA is generally detectable in upper respiratory specimens during the acute phase of infection. Positive results are indicative of the presence of the identified virus, but do not rule out bacterial infection or co-infection with other pathogens not detected by the test. Clinical correlation with patient history and other diagnostic information is necessary to determine patient infection status. The expected result is Negative.  Fact Sheet for Patients: EntrepreneurPulse.com.au  Fact Sheet for Healthcare Providers: IncredibleEmployment.be  This test is not yet approved or cleared by the [place] FDA and  has been authorized for detection and/or diagnosis of SARS-CoV-2 by FDA under an Emergency  Use Authorization (EUA).  This EUA will remain in effect (meaning this test can be used) for the duration of  the COVID-19 declaration under Section 564(b)(1) of the A ct, 21 U.S.C. section 360bbb-3(b)(1), unless the authorization is terminated or revoked sooner.     Influenza A by PCR NEGATIVE NEGATIVE Final   Influenza B by PCR NEGATIVE NEGATIVE Final    Comment: (NOTE) The Xpert Xpress SARS-CoV-2/FLU/RSV plus assay is intended as an aid in the diagnosis of influenza from Nasopharyngeal swab specimens and should not be used as a sole basis for treatment. Nasal washings and aspirates are unacceptable for Xpert Xpress SARS-CoV-2/FLU/RSV testing.  Fact Sheet for  Patients: EntrepreneurPulse.com.au  Fact Sheet for Healthcare Providers: IncredibleEmployment.be  This test is not yet approved or cleared by the [place] FDA and has been authorized for detection and/or diagnosis of SARS-CoV-2 by FDA under an Emergency Use Authorization (EUA). This EUA will remain in effect (meaning this test can be used) for the duration of the COVID-19 declaration under Section 564(b)(1) of the Act, 21 U.S.C. section 360bbb-3(b)(1), unless the authorization is terminated or revoked.  Performed at [facility], [place] [address] [place], [place] [address]   Blood culture (routine x 2)     Status: None (Preliminary result)   Collection Time: 02/07/22  8:39 PM   Specimen: BLOOD  Result Value Ref Range Status   Specimen Description   Final    BLOOD BLOOD RIGHT ARM Performed at [facility] [address], [place], [place] [address]    Special Requests   Final    BOTTLES DRAWN AEROBIC AND ANAEROBIC Blood Culture results may not be optimal due to an inadequate volume of blood received in culture bottles Performed at [facility] [address], [place], [place] [address]    Culture   Final    NO GROWTH 2 DAYS Performed at [facility] [address] [place], [place] [address]    Report Status PENDING  Incomplete  Blood culture (routine x 2)     Status: None (Preliminary result)   Collection Time: 02/07/22  8:49 PM   Specimen: BLOOD  Result Value Ref Range Status   Specimen Description   Final    BLOOD BLOOD LEFT ARM Performed at [facility] [address], [place], [place] [address]    Special Requests   Final    BOTTLES DRAWN AEROBIC AND ANAEROBIC Blood Culture results may not be optimal due to an inadequate volume of blood received in culture bottles Performed at [facility] [address], [place], [place]  [address]    Culture   Final    NO GROWTH 2 DAYS Performed at [facility] [address] [place], [place] [address]    Report Status PENDING  Incomplete         Radiology Studies: ECHOCARDIOGRAM COMPLETE  Result Date: 02/08/2022    ECHOCARDIOGRAM REPORT   Patient Name:   Nicholas Oconnell Date of Exam: 02/08/2022 Medical Rec #:  [id number]    Height:       68.0 in Accession #:    [id number]   Weight:       155.0 lb Date of Birth:  27-Aug-1960   BSA:          1.834 m Patient Age:    55 years     BP:           123/88 mmHg  Patient Gender: M            HR:           66 bpm. Exam Location:  Inpatient Procedure: 2D Echo, Color Doppler and Cardiac Doppler Indications:    Elevated Troponin  History:        Patient has prior history of Echocardiogram examinations, most                 recent 06/29/2021. Risk Factors:Hypertension.  Sonographer:    Memory Argue Referring Phys: [id number] [place]  1. Left ventricular ejection fraction, by estimation, is 55 to 60%. The left ventricle has normal function. The left ventricle has no regional wall motion abnormalities. There is mild left ventricular hypertrophy of the basal-septal segment. Left ventricular diastolic parameters are consistent with Grade I diastolic dysfunction (impaired relaxation).  2. Right ventricular systolic function is normal. The right ventricular size is normal.  3. The mitral valve is normal in structure. Trivial mitral valve regurgitation. No evidence of mitral stenosis.  4. The aortic valve is tricuspid. Aortic valve regurgitation is not visualized. Aortic valve sclerosis is present, with no evidence of aortic valve stenosis. FINDINGS  Left Ventricle: Left ventricular ejection fraction, by estimation, is 55 to 60%. The left ventricle has normal function. The left ventricle has no regional wall motion abnormalities. The left ventricular internal cavity size was normal in size. There is  mild left ventricular hypertrophy of the  basal-septal segment. Left ventricular diastolic parameters are consistent with Grade I diastolic dysfunction (impaired relaxation). Right Ventricle: The right ventricular size is normal. No increase in right ventricular wall thickness. Right ventricular systolic function is normal. Left Atrium: Left atrial size was normal in size. Right Atrium: Right atrial size was normal in size. Pericardium: There is no evidence of pericardial effusion. Mitral Valve: The mitral valve is normal in structure. Trivial mitral valve regurgitation. No evidence of mitral valve stenosis. Tricuspid Valve: The tricuspid valve is normal in structure. Tricuspid valve regurgitation is not demonstrated. No evidence of tricuspid stenosis. Aortic Valve: The aortic valve is tricuspid. Aortic valve regurgitation is not visualized. Aortic valve sclerosis is present, with no evidence of aortic valve stenosis. Aortic valve mean gradient measures 3.0 mmHg. Aortic valve peak gradient measures 3.9  mmHg. Aortic valve area, by VTI measures 1.91 cm. Pulmonic Valve: The pulmonic valve was normal in structure. Pulmonic valve regurgitation is trivial. No evidence of pulmonic stenosis. Aorta: The aortic root is normal in size and structure. Venous: The inferior vena cava was not well visualized. IAS/Shunts: The interatrial septum was not well visualized.  LEFT VENTRICLE PLAX 2D LVIDd:         3.90 cm LVIDs:         2.60 cm LV PW:         1.10 cm LV IVS:        1.40 cm LVOT diam:     1.90 cm LV SV:         44 LV SV Index:   24 LVOT Area:     2.84 cm  RIGHT VENTRICLE RV S prime:     12.70 cm/s TAPSE (M-mode): 1.9 cm LEFT ATRIUM             Index        RIGHT ATRIUM           Index LA diam:        3.60 cm 1.96 cm/m   RA Area:  12.00 cm LA Vol (A2C):   57.0 ml 31.08 ml/m  RA Volume:   25.80 ml  14.07 ml/m LA Vol (A4C):   54.0 ml 29.45 ml/m LA Biplane Vol: 55.2 ml 30.10 ml/m  AORTIC VALVE AV Area (Vmax):    2.35 cm AV Area (Vmean):   2.09 cm AV Area  (VTI):     1.91 cm AV Vmax:           99.00 cm/s AV Vmean:          75.400 cm/s AV VTI:            0.231 m AV Peak Grad:      3.9 mmHg AV Mean Grad:      3.0 mmHg LVOT Vmax:         82.20 cm/s LVOT Vmean:        55.600 cm/s LVOT VTI:          0.156 m LVOT/AV VTI ratio: 0.68  AORTA Ao Root diam: 3.00 cm MITRAL VALVE MV Area (PHT): 2.66 cm    SHUNTS MV Decel Time: 285 msec    Systemic VTI:  0.16 m MV E velocity: 53.60 cm/s  Systemic Diam: 1.90 cm MV A velocity: 77.10 cm/s MV E/A ratio:  0.70 MV A Prime:    7.2 cm/s Kirk Ruths MD Electronically signed by Kirk Ruths MD Signature Date/Time: 02/08/2022/3:42:24 PM    Final    CT ABDOMEN PELVIS WO CONTRAST  Result Date: 02/07/2022 CLINICAL DATA:  Nausea vomiting EXAM: CT ABDOMEN AND PELVIS WITHOUT CONTRAST TECHNIQUE: Multidetector CT imaging of the abdomen and pelvis was performed following the standard protocol without IV contrast. RADIATION DOSE REDUCTION: This exam was performed according to the departmental dose-optimization program which includes automated exposure control, adjustment of the mA and/or kV according to patient size and/or use of iterative reconstruction technique. COMPARISON:  CT 01/12/2022, 01/03/2022, 06/28/2021 FINDINGS: Lower chest: Lung bases demonstrate minimal posterior foci of consolidation. No pleural effusion. Small hiatal hernia. Hepatobiliary: No focal liver abnormality is seen. No gallstones, gallbladder wall thickening, or biliary dilatation. Pancreas: Unremarkable. No pancreatic ductal dilatation or surrounding inflammatory changes. Spleen: Normal in size without focal abnormality. Adrenals/Urinary Tract: Adrenal glands are normal. Mild bilateral renal collecting system dilatation and prominence of the ureters. Slight bladder distension. Stomach/Bowel: The stomach is nonenlarged. Postsurgical changes of the colon consistent with partial left colectomy and reanastomosis.: Is chronically dilated and fluid-filled. There is no acute  bowel wall thickening. Some fluid-filled small bowel but no small bowel distension. Vascular/Lymphatic: Nonaneurysmal aorta.  No suspicious lymph nodes Reproductive: Prostate is unremarkable. Other: Negative for pelvic effusion or free air Musculoskeletal: No acute osseous abnormality IMPRESSION: 1. No convincing evidence for bowel obstruction. Patient is status post partial left colectomy with reanastomosis. There is air and fluid distension of the entire colon which is similar compared to prior. There is no dilated small bowel or bowel wall thickening at this time. 2. Small foci of posterior lung consolidation could be due to atelectasis or mild aspiration. Electronically Signed   By: Donavan Foil M.D.   On: 02/07/2022 22:32   DG Chest Portable 1 View  Result Date: 02/07/2022 CLINICAL DATA:  Vomiting and chest pain with chills. EXAM: PORTABLE CHEST 1 VIEW COMPARISON:  AP Lat 01/24/2022 FINDINGS: The heart size and mediastinal contours are within normal limits. Both lungs are somewhat hypoinflated but generally clear. The visualized skeletal structures are unremarkable. Left upper abdominal surgical clips are again noted. IMPRESSION: No active disease.  Stable hypoinflated chest. Electronically Signed   By: Telford Nab M.D.   On: 02/07/2022 21:37        Scheduled Meds:  dexamethasone (DECADRON) injection  6 mg Intravenous Q24H   divalproex  1,000 mg Oral QHS   haloperidol  2 mg Oral TID   heparin  5,000 Units Subcutaneous Q8H   lactulose  20 g Oral TID   lithium carbonate  150 mg Oral Daily   lithium carbonate  300 mg Oral QHS   pantoprazole  40 mg Oral BID   polyethylene glycol  17 g Oral BID   propranolol  10 mg Oral TID   QUEtiapine  100 mg Oral QHS   QUEtiapine  400 mg Oral BID   senna-docusate  1 tablet Oral BID   sodium chloride flush  3 mL Intravenous Q12H   valbenazine  40 mg Oral Daily   zolpidem  10 mg Oral QHS   Continuous Infusions:  sodium chloride 100 mL/hr at 02/09/22  0002   remdesivir 100 mg in sodium chloride 0.9 % 100 mL IVPB Stopped (02/08/22 1004)          Aline August, MD [facility] 02/09/2022, 7:58 AM

## 2022-02-09 NOTE — Plan of Care (Signed)
  Problem: Education: Goal: Knowledge of General Education information will improve Description Including pain rating scale, medication(s)/side effects and non-pharmacologic comfort measures Outcome: Progressing   Problem: Health Behavior/Discharge Planning: Goal: Ability to manage health-related needs will improve Outcome: Progressing   

## 2022-02-09 NOTE — Progress Notes (Signed)
  Transition of Care [facility]) Screening Note   Patient Details  Name: Nicholas Oconnell Date of Birth: [birth year]   Transition of Care [facility]) CM/SW Contact:    Roseanne Kaufman, RN Phone Number: 02/09/2022, 4:09 PM    Transition of Care Department [facility]) has reviewed patient and no TOC needs have been identified at this time. We will continue to monitor patient advancement through interdisciplinary progression rounds. If new patient transition needs arise, please place a TOC consult.

## 2022-02-10 DIAGNOSIS — F250 Schizoaffective disorder, bipolar type: Secondary | ICD-10-CM | POA: Diagnosis not present

## 2022-02-10 DIAGNOSIS — N179 Acute kidney failure, unspecified: Secondary | ICD-10-CM | POA: Diagnosis not present

## 2022-02-10 DIAGNOSIS — U071 COVID-19: Secondary | ICD-10-CM | POA: Diagnosis not present

## 2022-02-10 DIAGNOSIS — N1831 Chronic kidney disease, stage 3a: Secondary | ICD-10-CM | POA: Diagnosis not present

## 2022-02-10 LAB — FERRITIN: Ferritin: 41 ng/mL (ref 24–336)

## 2022-02-10 LAB — COMPREHENSIVE METABOLIC PANEL
ALT: 16 U/L (ref 0–44)
AST: 18 U/L (ref 15–41)
Albumin: 2.6 g/dL — ABNORMAL LOW (ref 3.5–5.0)
Alkaline Phosphatase: 78 U/L (ref 38–126)
Anion gap: 6 (ref 5–15)
BUN: 20 mg/dL (ref 8–23)
CO2: 25 mmol/L (ref 22–32)
Calcium: 8.3 mg/dL — ABNORMAL LOW (ref 8.9–10.3)
Chloride: 112 mmol/L — ABNORMAL HIGH (ref 98–111)
Creatinine, Ser: 1.3 mg/dL — ABNORMAL HIGH (ref 0.61–1.24)
GFR, Estimated: >60 mL/min (ref >60)
Glucose, Bld: 94 mg/dL (ref 70–99)
Potassium: 4.6 mmol/L (ref 3.5–5.1)
Sodium: 143 mmol/L (ref 135–145)
Total Bilirubin: 0.4 mg/dL (ref 0.3–1.2)
Total Protein: 6.2 g/dL — ABNORMAL LOW (ref 6.5–8.1)

## 2022-02-10 LAB — CBC WITH DIFFERENTIAL/PLATELET
Abs Immature Granulocytes: 0.03 10*3/uL (ref 0.00–0.07)
Basophils Absolute: 0 10*3/uL (ref 0.0–0.1)
Basophils Relative: 0 %
Eosinophils Absolute: 0 10*3/uL (ref 0.0–0.5)
Eosinophils Relative: 0 %
HCT: 37.5 % — ABNORMAL LOW (ref 39.0–52.0)
Hemoglobin: 11.7 g/dL — ABNORMAL LOW (ref 13.0–17.0)
Immature Granulocytes: 1 %
Lymphocytes Relative: 25 %
Lymphs Abs: 1.1 10*3/uL (ref 0.7–4.0)
MCH: 29.4 pg (ref 26.0–34.0)
MCHC: 31.2 g/dL (ref 30.0–36.0)
MCV: 94.2 fL (ref 80.0–100.0)
Monocytes Absolute: 0.5 10*3/uL (ref 0.1–1.0)
Monocytes Relative: 11 %
Neutro Abs: 2.9 10*3/uL (ref 1.7–7.7)
Neutrophils Relative %: 63 %
Platelet Morphology: NORMAL
Platelets: 187 10*3/uL (ref 150–400)
RBC: 3.98 MIL/uL — ABNORMAL LOW (ref 4.22–5.81)
RDW: 14.6 % (ref 11.5–15.5)
WBC: 4.6 10*3/uL (ref 4.0–10.5)
nRBC: 0 % (ref 0.0–0.2)

## 2022-02-10 LAB — D-DIMER, QUANTITATIVE: D-Dimer, Quant: 0.57 ug/mL-FEU — ABNORMAL HIGH (ref 0.00–0.50)

## 2022-02-10 LAB — C-REACTIVE PROTEIN: CRP: 2 mg/dL — ABNORMAL HIGH (ref <1.0)

## 2022-02-10 NOTE — Progress Notes (Signed)
PROGRESS NOTE    Nicholas Oconnell  EPP:[id number] DOB: 1960-08-24 DOA: 02/07/2022 PCP: Neale Burly, MD   Brief Narrative:  61 y.o. 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  He presented on 02/07/2022 with chills and nausea and vomiting.  On presentation, he was febrile, slightly tachypneic, tachycardic.  Creatinine was 2.33, troponin 77, COVID-19 PCR was positive.  Chest x-ray negative for acute cardiopulmonary disease.  CT of abdomen and pelvis findings were similar to prior.  He was started on IV fluids, remdesivir.  Assessment & Plan:   Acute kidney injury on chronic kidney disease stage IIIa -Possibly prerenal from nausea and vomiting.  Baseline creatinine of 1.45.  Presented with creatinine of 2.33. -Improving with IV fluids.  Creatinine 1.30 today. -Repeat a.m. labs.  DC IV fluids   COVID-19 infection -COVID-19 Labs  Recent Labs    02/07/22 2346 02/08/22 0443 02/09/22 0405 02/10/22 0437  DDIMER 0.57* 1.65* 0.39 0.57*  FERRITIN  --  31 42 41  CRP 3.2* 4.1* 6.2*  --      Lab Results  Component Value Date   SARSCOV2NAA POSITIVE (A) 02/07/2022   [facility] NEGATIVE 06/28/2021   -Currently on room air.  Monitor daily inflammatory markers.  Currently Decadron.  Completed remdesivir -Continue isolation  Chronic constipation, history of Ogilvie syndrome -Recent admission for acute on chronic constipation eventually requiring colonoscopy -Continue current bowel regimen.  X-ray of abdomen on 02/09/2022 showed interval decrease in degree of colonic distention.  Had a bowel movement yesterday as per nursing staff.  Schizoaffective  disorder/cognitive impairment -Patient was seen by psychiatry during last admission and medication changes were made.  Psychiatry has resumed valbenazine.  Continue  lithium/Seroquel/divalproex/Haldol.  Outpatient follow-up with psychiatry  Elevated troponin From troponin 77 and subsequently 84.  No chest pain.  EKG showed no changes.  2D echo showed EF of 55 to 60% and grade 1 diastolic dysfunction.  No further work-up needed.  Outpatient follow-up with cardiology if needed.  Hypertension - blood pressure intermittently on the higher side.  Continue propranolol  Physical deconditioning -PT following   DVT prophylaxis: Heparin Code Status: Full Family Communication: None at bedside Disposition Plan: Status is: Inpatient Remains inpatient appropriate because: Of severity of illness  Consultants: psychiatry  Procedures: 2D echo  Antimicrobials: None   Subjective: Patient seen and examined at bedside.  Poor historian.  No fever, vomiting, agitation or seizures reported.  Had a bowel movement yesterday as per nursing staff. Objective: Vitals:   02/09/22 1149 02/09/22 1419 02/09/22 1609 02/09/22 2055  BP: (!) 125/97 (!) 143/89 (!) 143/97 (!) 145/96  Pulse: 71 70 85 (!) 106  Resp:  17  18  Temp:  97.9 F (36.6 C)  98.6 F (37 C)  TempSrc:  Oral  Oral  SpO2:  93%  99%  Weight:      Height:        Intake/Output Summary (Last 24 hours) at 02/10/2022 0740 Last data filed at 02/10/2022 6063 Gross per 24 hour  Intake 2915.64 ml  Output 1901 ml  Net 1014.64 ml    Filed Weights   02/08/22 0109  Weight: 64.8 kg    Examination:  General: No distress currently.  Still on room air.  Chronically ill and deconditioned looking.  ENT/neck: No obvious neck masses or JVD elevation noted respiratory: Bilateral decreased breath sounds at bases with scattered crackles  CVS: Currently rate controlled; S1 and S2 are heard  abdominal: Soft, nontender, has some distention; no organomegaly,  bowel sounds heard Extremities: No clubbing; mild lower extremity edema present CNS: Alert; still very slow to respond.  Poor historian.  No focal neurologic deficit.  Moves extremities.  Has some upper extremity tremors. Lymph: No obvious lymphadenopathy palpable  skin: No petechiae/rashes psych: No signs of agitation.  Flat affect  musculoskeletal: No obvious joint swelling/deformity   Data Reviewed: I have personally reviewed following labs and imaging studies  CBC: Recent Labs  Lab 02/07/22 2049 02/08/22 0443 02/09/22 0405 02/10/22 0437  WBC 8.5 5.9 6.5 4.6  NEUTROABS 6.2 3.5 4.6 2.9  HGB 13.0 11.4* 12.5* 11.7*  HCT 41.5 36.7* 40.8 37.5*  MCV 94.1 95.6 95.1 94.2  PLT 206 149* 202 453    Basic Metabolic Panel: Recent Labs  Lab 02/07/22 2049 02/08/22 0443 02/09/22 0405 02/10/22 0437  NA 139 140 144 143  K 4.6 4.3 5.1 4.6  CL 106 107 110 112*  CO2 20* '23 27 25  '$ GLUCOSE 122* 90 93 94  BUN 25* 24* 20 20  CREATININE 2.33* 1.87* 1.31* 1.30*  CALCIUM 8.6* 8.2* 8.5* 8.3*  MG  --  2.3  --   --   PHOS  --  5.0*  --   --     GFR: Estimated Creatinine Clearance: 54 mL/min (A) (by C-G formula based on SCr of 1.3 mg/dL (H)). Liver Function Tests: Recent Labs  Lab 02/07/22 2049 02/08/22 0443 02/09/22 0405 02/10/22 0437  AST 40 32 26 18  ALT '29 23 21 16  '$ ALKPHOS 114 89 89 78  BILITOT 0.6 0.3 0.5 0.4  PROT 8.0 6.5 6.8 6.2*  ALBUMIN 3.6 2.9* 2.9* 2.6*    Recent Labs  Lab 02/07/22 2049  LIPASE 36    No results for input(s): "AMMONIA" in the last 168 hours. Coagulation Profile: No results for input(s): "INR", "PROTIME" in the last 168 hours. Cardiac Enzymes: No results for input(s): "CKTOTAL", "CKMB", "CKMBINDEX", "TROPONINI" in the last 168 hours. BNP (last 3 results) No results for input(s): "PROBNP" in the last 8760 hours. HbA1C: No results for input(s): "HGBA1C" in the last 72 hours. CBG: No results for input(s): "GLUCAP" in the last 168 hours. Lipid  Profile: No results for input(s): "CHOL", "HDL", "LDLCALC", "TRIG", "CHOLHDL", "LDLDIRECT" in the last 72 hours. Thyroid Function Tests: No results for input(s): "TSH", "T4TOTAL", "FREET4", "T3FREE", "THYROIDAB" in the last 72 hours. Anemia Panel: Recent Labs    02/09/22 0405 02/10/22 0437  FERRITIN 42 41    Sepsis Labs: Recent Labs  Lab 02/07/22 2049 02/07/22 2346  PROCALCITON  --  0.23  LATICACIDVEN 1.5  --      Recent Results (from the past 240 hour(s))  Resp Panel by RT-PCR (Flu A&B, Covid) Anterior Nasal Swab     Status: Abnormal   Collection Time: 02/07/22  8:24 PM   Specimen: Anterior Nasal Swab  Result Value Ref Range Status   SARS Coronavirus 2 by RT PCR POSITIVE (A) NEGATIVE Final    Comment: (NOTE) SARS-CoV-2 target nucleic acids are DETECTED.  The SARS-CoV-2 RNA is generally detectable in upper respiratory specimens during the acute phase of infection. Positive results are indicative of the presence of the identified virus, but do not rule out bacterial infection or co-infection with other pathogens not detected by the  test. Clinical correlation with patient history and other diagnostic information is necessary to determine patient infection status. The expected result is Negative.  Fact Sheet for Patients: EntrepreneurPulse.com.au  Fact Sheet for Healthcare Providers: IncredibleEmployment.be  This test is not yet approved or cleared by the [place] FDA and  has been authorized for detection and/or diagnosis of SARS-CoV-2 by FDA under an Emergency Use Authorization (EUA).  This EUA will remain in effect (meaning this test can be used) for the duration of  the COVID-19 declaration under Section 564(b)(1) of the A ct, 21 U.S.C. section 360bbb-3(b)(1), unless the authorization is terminated or revoked sooner.     Influenza A by PCR NEGATIVE NEGATIVE Final   Influenza B by PCR NEGATIVE NEGATIVE Final    Comment:  (NOTE) The Xpert Xpress SARS-CoV-2/FLU/RSV plus assay is intended as an aid in the diagnosis of influenza from Nasopharyngeal swab specimens and should not be used as a sole basis for treatment. Nasal washings and aspirates are unacceptable for Xpert Xpress SARS-CoV-2/FLU/RSV testing.  Fact Sheet for Patients: EntrepreneurPulse.com.au  Fact Sheet for Healthcare Providers: IncredibleEmployment.be  This test is not yet approved or cleared by the [place] FDA and has been authorized for detection and/or diagnosis of SARS-CoV-2 by FDA under an Emergency Use Authorization (EUA). This EUA will remain in effect (meaning this test can be used) for the duration of the COVID-19 declaration under Section 564(b)(1) of the Act, 21 U.S.C. section 360bbb-3(b)(1), unless the authorization is terminated or revoked.  Performed at [facility], [place] [address] [place], [place] [address]   Blood culture (routine x 2)     Status: None (Preliminary result)   Collection Time: 02/07/22  8:39 PM   Specimen: BLOOD  Result Value Ref Range Status   Specimen Description   Final    BLOOD BLOOD RIGHT ARM Performed at [facility] [address], [place], [place] [address]    Special Requests   Final    BOTTLES DRAWN AEROBIC AND ANAEROBIC Blood Culture results may not be optimal due to an inadequate volume of blood received in culture bottles Performed at [place] [address] [place], [place] [address]    Culture   Final    NO GROWTH 3 DAYS Performed at [facility], [place] [address], [place], [place] [address]    Report Status PENDING  Incomplete  Blood culture (routine x 2)     Status: None (Preliminary result)   Collection Time: 02/07/22  8:49 PM   Specimen: BLOOD  Result Value Ref Range Status   Specimen Description   Final    BLOOD BLOOD LEFT ARM Performed at [facility] [address] [place], [place] [address]    Special Requests   Final    BOTTLES DRAWN AEROBIC AND ANAEROBIC Blood Culture results may not be optimal due to an inadequate volume of blood received in culture bottles Performed at [facility] [address] [place], [place] [address]    Culture   Final    NO GROWTH 3 DAYS Performed at [facility], [place] [address] [place],  [address]    Report Status PENDING  Incomplete         Radiology Studies: DG Abd 1 View  Result Date: 02/09/2022 CLINICAL DATA:  Abdominal pain, vomiting EXAM: ABDOMEN - 1 VIEW COMPARISON:  Radiographs done on 01/12/2022, CT done on 02/07/2022 FINDINGS: There is gaseous dilation of  the ascending and transverse colon. There is mild dilation of small bowel loops. Stomach is not distended. There is interval decrease in degree of colonic distention. Surgical clips are seen in left side of abdomen. IMPRESSION: There is interval decrease in degree of colonic distention. Findings may suggest ileus. Electronically Signed   By: Elmer Picker M.D.   On: 02/09/2022 14:10   ECHOCARDIOGRAM COMPLETE  Result Date: 02/08/2022    ECHOCARDIOGRAM REPORT   Patient Name:   Nicholas Oconnell Date of Exam: 02/08/2022 Medical Rec #:  [id number]    Height:       68.0 in Accession #:    [id number]   Weight:       155.0 lb Date of Birth:  Jan 18, 1961   BSA:          1.834 m Patient Age:    56 years     BP:           123/88 mmHg Patient Gender: M            HR:           66 bpm. Exam Location:  Inpatient Procedure: 2D Echo, Color Doppler and Cardiac Doppler Indications:    Elevated Troponin  History:        Patient has prior history of Echocardiogram examinations, most                 recent 06/29/2021. Risk Factors:Hypertension.  Sonographer:    Memory Argue Referring Phys: [id number] [place]  1. Left ventricular ejection fraction, by estimation, is 55 to 60%. The left ventricle has normal  function. The left ventricle has no regional wall motion abnormalities. There is mild left ventricular hypertrophy of the basal-septal segment. Left ventricular diastolic parameters are consistent with Grade I diastolic dysfunction (impaired relaxation).  2. Right ventricular systolic function is normal. The right ventricular size is normal.  3. The mitral valve is normal in structure. Trivial mitral valve regurgitation. No evidence of mitral stenosis.  4. The aortic valve is tricuspid. Aortic valve regurgitation is not visualized. Aortic valve sclerosis is present, with no evidence of aortic valve stenosis. FINDINGS  Left Ventricle: Left ventricular ejection fraction, by estimation, is 55 to 60%. The left ventricle has normal function. The left ventricle has no regional wall motion abnormalities. The left ventricular internal cavity size was normal in size. There is  mild left ventricular hypertrophy of the basal-septal segment. Left ventricular diastolic parameters are consistent with Grade I diastolic dysfunction (impaired relaxation). Right Ventricle: The right ventricular size is normal. No increase in right ventricular wall thickness. Right ventricular systolic function is normal. Left Atrium: Left atrial size was normal in size. Right Atrium: Right atrial size was normal in size. Pericardium: There is no evidence of pericardial effusion. Mitral Valve: The mitral valve is normal in structure. Trivial mitral valve regurgitation. No evidence of mitral valve stenosis. Tricuspid Valve: The tricuspid valve is normal in structure. Tricuspid valve regurgitation is not demonstrated. No evidence of tricuspid stenosis. Aortic Valve: The aortic valve is tricuspid. Aortic valve regurgitation is not visualized. Aortic valve sclerosis is present, with no evidence of aortic valve stenosis. Aortic valve mean gradient measures 3.0 mmHg. Aortic valve peak gradient measures 3.9  mmHg. Aortic valve area, by VTI measures 1.91 cm.  Pulmonic Valve: The pulmonic valve was normal in structure. Pulmonic valve regurgitation is trivial. No evidence of pulmonic stenosis. Aorta: The aortic root is normal in size and structure. Venous: The inferior vena  cava was not well visualized. IAS/Shunts: The interatrial septum was not well visualized.  LEFT VENTRICLE PLAX 2D LVIDd:         3.90 cm LVIDs:         2.60 cm LV PW:         1.10 cm LV IVS:        1.40 cm LVOT diam:     1.90 cm LV SV:         44 LV SV Index:   24 LVOT Area:     2.84 cm  RIGHT VENTRICLE RV S prime:     12.70 cm/s TAPSE (M-mode): 1.9 cm LEFT ATRIUM             Index        RIGHT ATRIUM           Index LA diam:        3.60 cm 1.96 cm/m   RA Area:     12.00 cm LA Vol (A2C):   57.0 ml 31.08 ml/m  RA Volume:   25.80 ml  14.07 ml/m LA Vol (A4C):   54.0 ml 29.45 ml/m LA Biplane Vol: 55.2 ml 30.10 ml/m  AORTIC VALVE AV Area (Vmax):    2.35 cm AV Area (Vmean):   2.09 cm AV Area (VTI):     1.91 cm AV Vmax:           99.00 cm/s AV Vmean:          75.400 cm/s AV VTI:            0.231 m AV Peak Grad:      3.9 mmHg AV Mean Grad:      3.0 mmHg LVOT Vmax:         82.20 cm/s LVOT Vmean:        55.600 cm/s LVOT VTI:          0.156 m LVOT/AV VTI ratio: 0.68  AORTA Ao Root diam: 3.00 cm MITRAL VALVE MV Area (PHT): 2.66 cm    SHUNTS MV Decel Time: 285 msec    Systemic VTI:  0.16 m MV E velocity: 53.60 cm/s  Systemic Diam: 1.90 cm MV A velocity: 77.10 cm/s MV E/A ratio:  0.70 MV A Prime:    7.2 cm/s Kirk Ruths MD Electronically signed by Kirk Ruths MD Signature Date/Time: 02/08/2022/3:42:24 PM    Final         Scheduled Meds:  dexamethasone (DECADRON) injection  6 mg Intravenous Q24H   divalproex  1,000 mg Oral QHS   haloperidol  2 mg Oral TID   heparin  5,000 Units Subcutaneous Q8H   lactulose  20 g Oral TID   lithium carbonate  150 mg Oral Daily   lithium carbonate  300 mg Oral QHS   pantoprazole  40 mg Oral BID   polyethylene glycol  17 g Oral BID   propranolol  10 mg Oral  TID   QUEtiapine  400 mg Oral BID   senna-docusate  1 tablet Oral BID   sodium chloride flush  3 mL Intravenous Q12H   valbenazine  40 mg Oral Daily   zolpidem  10 mg Oral QHS   Continuous Infusions:  sodium chloride 50 mL/hr at 02/10/22 1610          Aline August, MD [facility] 02/10/2022, 7:40 AM

## 2022-02-11 ENCOUNTER — Inpatient Hospital Stay (HOSPITAL_COMMUNITY): Payer: Medicaid Other

## 2022-02-11 DIAGNOSIS — K5900 Constipation, unspecified: Secondary | ICD-10-CM | POA: Diagnosis not present

## 2022-02-11 DIAGNOSIS — F250 Schizoaffective disorder, bipolar type: Secondary | ICD-10-CM | POA: Diagnosis not present

## 2022-02-11 DIAGNOSIS — N179 Acute kidney failure, unspecified: Secondary | ICD-10-CM | POA: Diagnosis not present

## 2022-02-11 DIAGNOSIS — U071 COVID-19: Secondary | ICD-10-CM | POA: Diagnosis not present

## 2022-02-11 LAB — CBC WITH DIFFERENTIAL/PLATELET
Abs Immature Granulocytes: 0.02 10*3/uL (ref 0.00–0.07)
Basophils Absolute: 0 10*3/uL (ref 0.0–0.1)
Basophils Relative: 0 %
Eosinophils Absolute: 0 10*3/uL (ref 0.0–0.5)
Eosinophils Relative: 0 %
HCT: 39.6 % (ref 39.0–52.0)
Hemoglobin: 12.4 g/dL — ABNORMAL LOW (ref 13.0–17.0)
Immature Granulocytes: 0 %
Lymphocytes Relative: 20 %
Lymphs Abs: 1.8 10*3/uL (ref 0.7–4.0)
MCH: 29.5 pg (ref 26.0–34.0)
MCHC: 31.3 g/dL (ref 30.0–36.0)
MCV: 94.3 fL (ref 80.0–100.0)
Monocytes Absolute: 0.6 10*3/uL (ref 0.1–1.0)
Monocytes Relative: 7 %
Neutro Abs: 6.3 10*3/uL (ref 1.7–7.7)
Neutrophils Relative %: 73 %
Platelets: 216 10*3/uL (ref 150–400)
RBC: 4.2 MIL/uL — ABNORMAL LOW (ref 4.22–5.81)
RDW: 14.7 % (ref 11.5–15.5)
WBC: 8.7 10*3/uL (ref 4.0–10.5)
nRBC: 0 % (ref 0.0–0.2)

## 2022-02-11 LAB — COMPREHENSIVE METABOLIC PANEL
ALT: 16 U/L (ref 0–44)
AST: 17 U/L (ref 15–41)
Albumin: 3.2 g/dL — ABNORMAL LOW (ref 3.5–5.0)
Alkaline Phosphatase: 81 U/L (ref 38–126)
Anion gap: 9 (ref 5–15)
BUN: 19 mg/dL (ref 8–23)
CO2: 27 mmol/L (ref 22–32)
Calcium: 8.8 mg/dL — ABNORMAL LOW (ref 8.9–10.3)
Chloride: 109 mmol/L (ref 98–111)
Creatinine, Ser: 1.46 mg/dL — ABNORMAL HIGH (ref 0.61–1.24)
GFR, Estimated: 54 mL/min — ABNORMAL LOW (ref >60)
Glucose, Bld: 99 mg/dL (ref 70–99)
Potassium: 4.3 mmol/L (ref 3.5–5.1)
Sodium: 145 mmol/L (ref 135–145)
Total Bilirubin: 0.6 mg/dL (ref 0.3–1.2)
Total Protein: 7.2 g/dL (ref 6.5–8.1)

## 2022-02-11 LAB — FERRITIN: Ferritin: 40 ng/mL (ref 24–336)

## 2022-02-11 LAB — D-DIMER, QUANTITATIVE: D-Dimer, Quant: 0.56 ug/mL-FEU — ABNORMAL HIGH (ref 0.00–0.50)

## 2022-02-11 LAB — C-REACTIVE PROTEIN: CRP: 1.3 mg/dL — ABNORMAL HIGH (ref <1.0)

## 2022-02-11 MED ORDER — FUROSEMIDE 10 MG/ML IJ SOLN
20.0000 mg | Freq: Once | INTRAMUSCULAR | Status: DC
Start: 2022-02-11 — End: 2022-02-11

## 2022-02-11 MED ORDER — PANTOPRAZOLE SODIUM 40 MG IV SOLR
40.0000 mg | Freq: Two times a day (BID) | INTRAVENOUS | Status: DC
  Administered 2022-02-11 – 2022-02-13 (×5): 40 mg via INTRAVENOUS
  Filled 2022-02-11 (×5): qty 10

## 2022-02-11 MED ORDER — BISACODYL 10 MG RE SUPP
10.0000 mg | Freq: Every day | RECTAL | Status: DC | PRN
  Administered 2022-02-11: 10 mg via RECTAL
  Filled 2022-02-11: qty 1

## 2022-02-11 MED ORDER — SENNOSIDES-DOCUSATE SODIUM 8.6-50 MG PO TABS
2.0000 | ORAL_TABLET | Freq: Two times a day (BID) | ORAL | Status: DC
  Administered 2022-02-11 – 2022-02-14 (×6): 2 via ORAL
  Filled 2022-02-11 (×6): qty 2

## 2022-02-11 MED ORDER — SORBITOL 70 % SOLN
960.0000 mL | TOPICAL_OIL | Freq: Once | ORAL | Status: DC
  Filled 2022-02-11: qty 473

## 2022-02-11 NOTE — Progress Notes (Signed)
Patient with chronic constipation and hx of Ogilvie syndrome has had N/V this shift and abdomen more distended per RN.   Plan to check KUB.

## 2022-02-11 NOTE — Progress Notes (Signed)
PROGRESS NOTE    Nicholas Oconnell  OAC:[id number] DOB: 01-13-1961 DOA: 02/07/2022 PCP: Neale Burly, MD   Brief Narrative:  61 y.o. 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  He presented on 02/07/2022 with chills and nausea and vomiting.  On presentation, he was febrile, slightly tachypneic, tachycardic.  Creatinine was 2.33, troponin 77, COVID-19 PCR was positive.  Chest x-ray negative for acute cardiopulmonary disease.  CT of abdomen and pelvis findings were similar to prior.  He was started on IV fluids, remdesivir.  Assessment & Plan:   Acute kidney injury on chronic kidney disease stage IIIa -Possibly prerenal from nausea and vomiting.  Baseline creatinine of 1.45.  Presented with creatinine of 2.33. -Treated with IV fluids and subsequently discontinued.  Creatinine 1.46 today. -Repeat a.m. labs.    COVID-19 infection -COVID-19 Labs  Recent Labs    02/09/22 0405 02/10/22 0437 02/11/22 0405  DDIMER 0.39 0.57* 0.56*  FERRITIN 42 41 40  CRP 6.2* 2.0*  --      Lab Results  Component Value Date   SARSCOV2NAA POSITIVE (A) 02/07/2022   [place] NEGATIVE 06/28/2021   -Currently on room air.  Monitor daily inflammatory markers.  Currently on Decadron.  Completed remdesivir -Continue isolation  Chronic constipation, history of Ogilvie syndrome -Recent admission for acute on chronic constipation eventually requiring colonoscopy -Continue current bowel regimen.  -Patient had vomiting overnight with increasing abdominal distention as per nursing staff.  Follow x-ray of abdomen.  Schizoaffective disorder/cognitive impairment -Patient was seen by  psychiatry during last admission and medication changes were made.  Psychiatry has resumed valbenazine.  Continue  lithium/Seroquel/divalproex/Haldol.  Outpatient follow-up with psychiatry  Elevated troponin From troponin 77 and subsequently 84.  No chest pain.  EKG showed no changes.  2D echo showed EF of 55 to 60% and grade 1 diastolic dysfunction.  No further work-up needed.  Outpatient follow-up with cardiology if needed.  Hypertension - blood pressure intermittently on the higher side.  Continue propranolol  Physical deconditioning -PT following   DVT prophylaxis: Heparin Code Status: Full Family Communication: None at bedside Disposition Plan: Status is: Inpatient Remains inpatient appropriate because: Of severity of illness  Consultants: psychiatry  Procedures: 2D echo  Antimicrobials: None   Subjective: Patient seen and examined at bedside.  Poor historian.  Nursing staff reports overnight vomiting.  No fever, seizures, agitation reported  objective: Vitals:   02/10/22 0904 02/10/22 1231 02/10/22 2032 02/11/22 0726  BP: (!) 153/63 138/69 (!) 168/103 (!) 128/97  Pulse:  72 88 98  Resp: '16 17 17 20  '$ Temp:  (!) 97.5 F (36.4 C) 98.6 F (37 C) 98 F (36.7 C)  TempSrc:  Oral Oral   SpO2: 100% 97% 99% 96%  Weight:      Height:        Intake/Output Summary (Last 24 hours) at 02/11/2022 0744 Last data filed at 02/11/2022 0700 Gross per 24 hour  Intake 966.6 ml  Output 48 ml  Net 918.6 ml    Filed Weights   02/08/22 0109  Weight: 64.8 kg    Examination:  General: On room air currently.  No acute distress.  Chronically ill and deconditioned looking. ENT/neck: No palpable neck masses or JVD elevation noted respiratory: Decreased breath sounds at bases  bilaterally, no wheezing CVS: S1-S2 heard; rate controlled currently  abdominal: Soft, nontender, looks more distended today; no organomegaly, bowel sounds are heard  extremities: Trace lower extremity edema  present; no cyanosis CNS: Extremely slow to respond; awake.  Poor historian.  No focal neurologic deficit.  Able to move extremities.  Has some upper extremity tremors. Lymph: No cervical lymphadenopathy is palpable  skin: No ecchymosis or lesions noted psych: Extremely flat affect.  Currently not agitated  musculoskeletal: No obvious joint swelling/deformity   Data Reviewed: I have personally reviewed following labs and imaging studies  CBC: Recent Labs  Lab 02/07/22 2049 02/08/22 0443 02/09/22 0405 02/10/22 0437 02/11/22 0405  WBC 8.5 5.9 6.5 4.6 8.7  NEUTROABS 6.2 3.5 4.6 2.9 6.3  HGB 13.0 11.4* 12.5* 11.7* 12.4*  HCT 41.5 36.7* 40.8 37.5* 39.6  MCV 94.1 95.6 95.1 94.2 94.3  PLT 206 149* 202 187 979    Basic Metabolic Panel: Recent Labs  Lab 02/07/22 2049 02/08/22 0443 02/09/22 0405 02/10/22 0437 02/11/22 0405  NA 139 140 144 143 145  K 4.6 4.3 5.1 4.6 4.3  CL 106 107 110 112* 109  CO2 20* '23 27 25 27  '$ GLUCOSE 122* 90 93 94 99  BUN 25* 24* '20 20 19  '$ CREATININE 2.33* 1.87* 1.31* 1.30* 1.46*  CALCIUM 8.6* 8.2* 8.5* 8.3* 8.8*  MG  --  2.3  --   --   --   PHOS  --  5.0*  --   --   --     GFR: Estimated Creatinine Clearance: 48.1 mL/min (A) (by C-G formula based on SCr of 1.46 mg/dL (H)). Liver Function Tests: Recent Labs  Lab 02/07/22 2049 02/08/22 0443 02/09/22 0405 02/10/22 0437 02/11/22 0405  AST 40 32 '26 18 17  '$ ALT '29 23 21 16 16  '$ ALKPHOS 114 89 89 78 81  BILITOT 0.6 0.3 0.5 0.4 0.6  PROT 8.0 6.5 6.8 6.2* 7.2  ALBUMIN 3.6 2.9* 2.9* 2.6* 3.2*    Recent Labs  Lab 02/07/22 2049  LIPASE 36    No results for input(s): "AMMONIA" in the last 168 hours. Coagulation Profile: No results for input(s): "INR", "PROTIME" in the last 168 hours. Cardiac Enzymes: No results for input(s): "CKTOTAL", "CKMB", "CKMBINDEX", "TROPONINI" in the last 168 hours. BNP (last 3 results) No results for input(s): "PROBNP" in the last 8760 hours. HbA1C: No results for  input(s): "HGBA1C" in the last 72 hours. CBG: No results for input(s): "GLUCAP" in the last 168 hours. Lipid Profile: No results for input(s): "CHOL", "HDL", "LDLCALC", "TRIG", "CHOLHDL", "LDLDIRECT" in the last 72 hours. Thyroid Function Tests: No results for input(s): "TSH", "T4TOTAL", "FREET4", "T3FREE", "THYROIDAB" in the last 72 hours. Anemia Panel: Recent Labs    02/10/22 0437 02/11/22 0405  FERRITIN 41 40    Sepsis Labs: Recent Labs  Lab 02/07/22 2049 02/07/22 2346  PROCALCITON  --  0.23  LATICACIDVEN 1.5  --      Recent Results (from the past 240 hour(s))  Resp Panel by RT-PCR (Flu A&B, Covid) Anterior Nasal Swab     Status: Abnormal   Collection Time: 02/07/22  8:24 PM   Specimen: Anterior Nasal Swab  Result Value Ref Range Status   SARS Coronavirus 2 by RT PCR POSITIVE (A) NEGATIVE Final    Comment: (NOTE) SARS-CoV-2 target nucleic acids are DETECTED.  The SARS-CoV-2 RNA is generally detectable in upper respiratory specimens during the acute phase of infection. Positive results are indicative of the presence  of the identified virus, but do not rule out bacterial infection or co-infection with other pathogens not detected by the test. Clinical correlation with patient history and other diagnostic information is necessary to determine patient infection status. The expected result is Negative.  Fact Sheet for Patients: EntrepreneurPulse.com.au  Fact Sheet for Healthcare Providers: IncredibleEmployment.be  This test is not yet approved or cleared by the [place] FDA and  has been authorized for detection and/or diagnosis of SARS-CoV-2 by FDA under an Emergency Use Authorization (EUA).  This EUA will remain in effect (meaning this test can be used) for the duration of  the COVID-19 declaration under Section 564(b)(1) of the A ct, 21 U.S.C. section 360bbb-3(b)(1), unless the authorization is terminated or revoked  sooner.     Influenza A by PCR NEGATIVE NEGATIVE Final   Influenza B by PCR NEGATIVE NEGATIVE Final    Comment: (NOTE) The Xpert Xpress SARS-CoV-2/FLU/RSV plus assay is intended as an aid in the diagnosis of influenza from Nasopharyngeal swab specimens and should not be used as a sole basis for treatment. Nasal washings and aspirates are unacceptable for Xpert Xpress SARS-CoV-2/FLU/RSV testing.  Fact Sheet for Patients: EntrepreneurPulse.com.au  Fact Sheet for Healthcare Providers: IncredibleEmployment.be  This test is not yet approved or cleared by the [place] FDA and has been authorized for detection and/or diagnosis of SARS-CoV-2 by FDA under an Emergency Use Authorization (EUA). This EUA will remain in effect (meaning this test can be used) for the duration of the COVID-19 declaration under Section 564(b)(1) of the Act, 21 U.S.C. section 360bbb-3(b)(1), unless the authorization is terminated or revoked.  Performed at [facility], [place] [address], [place], [place] [address]   Blood culture (routine x 2)     Status: None (Preliminary result)   Collection Time: 02/07/22  8:39 PM   Specimen: BLOOD  Result Value Ref Range Status   Specimen Description   Final    BLOOD BLOOD RIGHT ARM Performed at [facility] [address] [place], [place] [address]    Special Requests   Final    BOTTLES DRAWN AEROBIC AND ANAEROBIC Blood Culture results may not be optimal due to an inadequate volume of blood received in culture bottles Performed at [facility] [address] [place], [place] [address]    Culture   Final    NO GROWTH 4 DAYS Performed at [facility], [place] [address] [place], [place] [address]    Report Status PENDING  Incomplete  Blood culture (routine x 2)     Status: None (Preliminary result)   Collection Time: 02/07/22  8:49 PM   Specimen: BLOOD  Result Value  Ref Range Status   Specimen Description   Final    BLOOD BLOOD LEFT ARM Performed at [facility] [address], [place], [place] [address]    Special Requests   Final    BOTTLES DRAWN AEROBIC AND ANAEROBIC Blood Culture results may not be optimal due to an inadequate volume of blood received in culture bottles Performed at [facility] [address], [place], [place] [address]    Culture   Final    NO GROWTH 4 DAYS Performed at [facility], [place] [address] [place], [place] [address]    Report Status PENDING  Incomplete         Radiology Studies: DG Abd 1 View  Result Date: 02/09/2022 CLINICAL DATA:  Abdominal pain, vomiting EXAM:  ABDOMEN - 1 VIEW COMPARISON:  Radiographs done on 01/12/2022, CT done on 02/07/2022 FINDINGS: There is gaseous dilation of the ascending and transverse colon. There is mild dilation of small bowel loops. Stomach is not distended. There is interval decrease in degree of colonic distention. Surgical clips are seen in left side of abdomen. IMPRESSION: There is interval decrease in degree of colonic distention. Findings may suggest ileus. Electronically Signed   By: Elmer Picker M.D.   On: 02/09/2022 14:10        Scheduled Meds:  dexamethasone (DECADRON) injection  6 mg Intravenous Q24H   divalproex  1,000 mg Oral QHS   haloperidol  2 mg Oral TID   heparin  5,000 Units Subcutaneous Q8H   lactulose  20 g Oral TID   lithium carbonate  150 mg Oral Daily   lithium carbonate  300 mg Oral QHS   pantoprazole  40 mg Oral BID   polyethylene glycol  17 g Oral BID   propranolol  10 mg Oral TID   QUEtiapine  400 mg Oral BID   senna-docusate  1 tablet Oral BID   sodium chloride flush  3 mL Intravenous Q12H   valbenazine  40 mg Oral Daily   zolpidem  10 mg Oral QHS   Continuous Infusions:          Aline August, MD [facility] 02/11/2022, 7:44 AM

## 2022-02-12 ENCOUNTER — Inpatient Hospital Stay (HOSPITAL_COMMUNITY): Payer: Medicaid Other

## 2022-02-12 DIAGNOSIS — U071 COVID-19: Secondary | ICD-10-CM | POA: Diagnosis not present

## 2022-02-12 DIAGNOSIS — K5900 Constipation, unspecified: Secondary | ICD-10-CM | POA: Diagnosis not present

## 2022-02-12 DIAGNOSIS — N179 Acute kidney failure, unspecified: Secondary | ICD-10-CM | POA: Diagnosis not present

## 2022-02-12 DIAGNOSIS — F250 Schizoaffective disorder, bipolar type: Secondary | ICD-10-CM | POA: Diagnosis not present

## 2022-02-12 LAB — COMPREHENSIVE METABOLIC PANEL
ALT: 12 U/L (ref 0–44)
AST: 13 U/L — ABNORMAL LOW (ref 15–41)
Albumin: 2.8 g/dL — ABNORMAL LOW (ref 3.5–5.0)
Alkaline Phosphatase: 69 U/L (ref 38–126)
Anion gap: 8 (ref 5–15)
BUN: 16 mg/dL (ref 8–23)
CO2: 26 mmol/L (ref 22–32)
Calcium: 8.1 mg/dL — ABNORMAL LOW (ref 8.9–10.3)
Chloride: 108 mmol/L (ref 98–111)
Creatinine, Ser: 1.37 mg/dL — ABNORMAL HIGH (ref 0.61–1.24)
GFR, Estimated: 58 mL/min — ABNORMAL LOW (ref >60)
Glucose, Bld: 88 mg/dL (ref 70–99)
Potassium: 4.1 mmol/L (ref 3.5–5.1)
Sodium: 142 mmol/L (ref 135–145)
Total Bilirubin: 0.3 mg/dL (ref 0.3–1.2)
Total Protein: 6.6 g/dL (ref 6.5–8.1)

## 2022-02-12 LAB — CULTURE, BLOOD (ROUTINE X 2)
Culture: NO GROWTH
Culture: NO GROWTH

## 2022-02-12 LAB — CBC WITH DIFFERENTIAL/PLATELET
Abs Immature Granulocytes: 0 10*3/uL (ref 0.00–0.07)
Basophils Absolute: 0.1 10*3/uL (ref 0.0–0.1)
Basophils Relative: 1 %
Eosinophils Absolute: 0 10*3/uL (ref 0.0–0.5)
Eosinophils Relative: 0 %
HCT: 37.8 % — ABNORMAL LOW (ref 39.0–52.0)
Hemoglobin: 11.6 g/dL — ABNORMAL LOW (ref 13.0–17.0)
Lymphocytes Relative: 24 %
Lymphs Abs: 1.8 10*3/uL (ref 0.7–4.0)
MCH: 28.9 pg (ref 26.0–34.0)
MCHC: 30.7 g/dL (ref 30.0–36.0)
MCV: 94.3 fL (ref 80.0–100.0)
Monocytes Absolute: 0.7 10*3/uL (ref 0.1–1.0)
Monocytes Relative: 10 %
Neutro Abs: 4.7 10*3/uL (ref 1.7–7.7)
Neutrophils Relative %: 65 %
Platelets: 208 10*3/uL (ref 150–400)
RBC: 4.01 MIL/uL — ABNORMAL LOW (ref 4.22–5.81)
RDW: 14.4 % (ref 11.5–15.5)
WBC: 7.3 10*3/uL (ref 4.0–10.5)
nRBC: 0 % (ref 0.0–0.2)

## 2022-02-12 LAB — C-REACTIVE PROTEIN: CRP: 2.2 mg/dL — ABNORMAL HIGH (ref <1.0)

## 2022-02-12 LAB — FERRITIN: Ferritin: 33 ng/mL (ref 24–336)

## 2022-02-12 LAB — D-DIMER, QUANTITATIVE: D-Dimer, Quant: 0.34 ug/mL-FEU (ref 0.00–0.50)

## 2022-02-12 NOTE — TOC Initial Note (Addendum)
Transition of Care [facility]) - Initial/Assessment Note    Patient Details  Name: Nicholas Oconnell MRN: [id number] Date of Birth: 1961/05/14  [facility] CM/SW Contact:    Roseanne Kaufman, RN Phone Number: 02/12/2022, 10:36 AM  Clinical Narrative:     Patient from [facility] (Group Home), left message for [facility] manager Berniece Pap for status on when patient can return to [facility] related to COVID precautions. Awaiting call back, MD notified.   TOC will continue to follow.             - 11:06am spoke with Alison Murray with [facility], who advised patient can return whenever ready for discharge. [facility] will transport patient at discharge. MD notified.  TOC will continue to follow.  Expected Discharge Plan: Group Home Barriers to Discharge: Continued Medical Work up   Patient Goals and CMS Choice Patient states their goals for this hospitalization and ongoing recovery are:: retunr to group home   Choice offered to / list presented to : NA  Expected Discharge Plan and Services Expected Discharge Plan: Group Home In-house Referral: NA Discharge Planning Services: CM Consult Post Acute Care Choice: NA Living arrangements for the past 2 months: Group Home                 DME Arranged: N/A DME Agency: NA       HH Arranged: NA [place] Agency: NA        Prior Living Arrangements/Services Living arrangements for the past 2 months: Group Home Lives with:: Facility Resident Patient language and need for interpreter reviewed:: Yes Do you feel safe going back to the place where you live?: Yes      Need for Family Participation in Patient Care: Yes (Comment) Care giver support system in place?: Yes (comment) Current home services: DME (rollator walker) Criminal Activity/Legal Involvement Pertinent to Current Situation/Hospitalization: No - Comment as needed  Activities of Daily Living Home Assistive Devices/Equipment: Environmental consultant (specify type) (rolator) ADL Screening (condition at time of  admission) Patient's cognitive ability adequate to safely complete daily activities?: Yes Is the patient deaf or have difficulty hearing?: No Does the patient have difficulty seeing, even when wearing glasses/contacts?: No Does the patient have difficulty concentrating, remembering, or making decisions?: No Patient able to express need for assistance with ADLs?: Yes Does the patient have difficulty dressing or bathing?: No Independently performs ADLs?: No Communication: Independent Dressing (OT): Needs assistance Is this a change from baseline?: Pre-admission baseline Grooming: Needs assistance Is this a change from baseline?: Pre-admission baseline Feeding: Independent Bathing: Needs assistance Is this a change from baseline?: Pre-admission baseline Toileting: Needs assistance Is this a change from baseline?: Pre-admission baseline In/Out Bed: Independent Walks in Home: Independent with device (comment) (rolator) Does the patient have difficulty walking or climbing stairs?: No Weakness of Legs: Both Weakness of Arms/Hands: None  Permission Sought/Granted Permission sought to share information with : Case Manager Permission granted to share information with : Yes, Verbal Permission Granted  Share Information with NAME: Case Manager           Emotional Assessment Appearance:: Appears stated age Attitude/Demeanor/Rapport: Unable to Assess Affect (typically observed): Unable to Assess   Alcohol / Substance Use: Not Applicable Psych Involvement: No (comment)  Admission diagnosis:  Acute kidney injury ([place]) [N17.9] Acute renal failure superimposed on stage 3a chronic kidney disease ([place]) [N17.9, N18.31] COVID-19 [U07.1] Patient Active Problem List   Diagnosis Date Noted   Acute renal failure superimposed on stage 3a chronic kidney disease ([place])  02/07/2022   COVID-19 virus infection 02/07/2022   Ogilvie syndrome 01/04/2022   Abdominal distention 01/03/2022   HTN  (hypertension) 01/03/2022   Hypocalcemia 01/03/2022   Elevated alkaline phosphatase level 01/03/2022   Generalized weakness 01/03/2022   History of tremor 01/03/2022   Constipation 01/03/2022   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   Aspiration pneumonitis ([place]) 07/03/2021   GI bleeding 06/28/2021   CKD (chronic kidney disease), stage III (HCC)    Pneumonia    Cardiomegaly    Schizoaffective disorder, bipolar type ([place])    Acute pseudo-obstruction of bowel    Acute encephalopathy    PCP:  Neale Burly, MD Pharmacy:  No Pharmacies Listed    Social Determinants of Health (SDOH) Interventions Food Insecurity Interventions: Intervention Not Indicated Utilities Interventions: Intervention Not Indicated  Readmission Risk Interventions    01/08/2022   10:05 AM  Readmission Risk Prevention Plan  Transportation Screening Complete  PCP or Specialist Appt within 5-7 Days Complete  Home Care Screening Complete  Medication Review (RN CM) Complete

## 2022-02-12 NOTE — Progress Notes (Signed)
PROGRESS NOTE    Savan Ruta  OVF:[id number] DOB: 1960-06-07 DOA: 02/07/2022 PCP: Neale Burly, MD   Brief Narrative:  61 y.o. 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  He presented on 02/07/2022 with chills and nausea and vomiting.  On presentation, he was febrile, slightly tachypneic, tachycardic.  Creatinine was 2.33, troponin 77, COVID-19 PCR was positive.  Chest x-ray negative for acute cardiopulmonary disease.  CT of abdomen and pelvis findings were similar to prior.  He was started on IV fluids, remdesivir.  Assessment & Plan:   Acute kidney injury on chronic kidney disease stage IIIa -Possibly prerenal from nausea and vomiting.  Baseline creatinine of 1.45.  Presented with creatinine of 2.33. -Treated with IV fluids and subsequently discontinued.  Creatinine 1.37 today. -Repeat a.m. labs.    COVID-19 infection -COVID-19 Labs  Recent Labs    02/10/22 0437 02/11/22 0405 02/12/22 0349  DDIMER 0.57* 0.56* 0.34  FERRITIN 41 40 33  CRP 2.0* 1.3*  --      Lab Results  Component Value Date   SARSCOV2NAA POSITIVE (A) 02/07/2022   [place] NEGATIVE 06/28/2021   -Currently on room air.    Currently on Decadron.  Completed remdesivir -Continue isolation  Chronic constipation, history of Ogilvie syndrome -Recent admission for acute on chronic constipation eventually requiring colonoscopy -Continue current bowel regimen.  -X-ray this morning shows worsening colonic distention although patient had good bowel movement with enema and Dulcolax yesterday.  We will give another dose of enema and Dulcolax.  Repeat a.m. x-ray of  abdomen.  Schizoaffective disorder/cognitive impairment -Patient was seen by psychiatry during last admission and medication changes were made.  Psychiatry has resumed valbenazine.  Continue  lithium/Seroquel/divalproex/Haldol.  Outpatient follow-up with psychiatry  Elevated troponin From troponin 77 and subsequently 84.  No chest pain.  EKG showed no changes.  2D echo showed EF of 55 to 60% and grade 1 diastolic dysfunction.  No further work-up needed.  Outpatient follow-up with cardiology if needed.  Hypertension - blood pressure intermittently on the higher side.  Continue propranolol  Physical deconditioning -PT following   DVT prophylaxis: Heparin Code Status: Full Family Communication: None at bedside Disposition Plan: Status is: Inpatient Remains inpatient appropriate because: Of severity of illness  Consultants: psychiatry  Procedures: 2D echo  Antimicrobials: None   Subjective: Patient seen and examined at bedside.  Poor historian.  Nursing staff reported that patient had good bowel movement yesterday after enema and Dulcolax suppository.  No overnight fever, agitation reported.   Objective: Vitals:   02/11/22 1045 02/11/22 1318 02/11/22 2201 02/12/22 0618  BP: (!) 145/99 (!) 137/105 119/82 109/77  Pulse:  88 71 63  Resp:  20 17 (!) 21  Temp:  98.8 F (37.1 C) 97.9 F (36.6 C) 98.1 F (36.7 C)  TempSrc:  Oral Oral Oral  SpO2:  96% 96% 94%  Weight:      Height:        Intake/Output Summary (Last 24 hours) at 02/12/2022 1024 Last data filed at 02/12/2022 8416 Gross per 24 hour  Intake 240 ml  Output 700 ml  Net -460 ml    Filed Weights   02/08/22 0109  Weight: 64.8 kg    Examination:  General: No distress.  On room  air.  Chronically ill and deconditioned looking. ENT/neck: No obvious JVD elevation or neck masses palpable respiratory: Bilateral decreased breath sounds at bases, scattered crackles  CVS: Mostly rate controlled; S1 and S2 are heard   abdominal: Soft, nontender, looks slightly less distended today; no organomegaly, bowel sounds are heard  extremities: No clubbing; mild lower extremity edema present  CNS: Alert; still slow to respond.  Poor historian.  No focal neurologic deficit.  Moving extremities.  Has some upper extremity tremors. Lymph: No palpable obvious lymphadenopathy skin: No petechiae or lesions  psych: Showing no signs of agitation currently.  Did not participate in conversation much.  Flat affect. musculoskeletal: No obvious joint swelling/deformity   Data Reviewed: I have personally reviewed following labs and imaging studies  CBC: Recent Labs  Lab 02/08/22 0443 02/09/22 0405 02/10/22 0437 02/11/22 0405 02/12/22 0349  WBC 5.9 6.5 4.6 8.7 7.3  NEUTROABS 3.5 4.6 2.9 6.3 4.7  HGB 11.4* 12.5* 11.7* 12.4* 11.6*  HCT 36.7* 40.8 37.5* 39.6 37.8*  MCV 95.6 95.1 94.2 94.3 94.3  PLT 149* 202 187 216 322    Basic Metabolic Panel: Recent Labs  Lab 02/08/22 0443 02/09/22 0405 02/10/22 0437 02/11/22 0405 02/12/22 0349  NA 140 144 143 145 142  K 4.3 5.1 4.6 4.3 4.1  CL 107 110 112* 109 108  CO2 '23 27 25 27 26  '$ GLUCOSE 90 93 94 99 88  BUN 24* '20 20 19 16  '$ CREATININE 1.87* 1.31* 1.30* 1.46* 1.37*  CALCIUM 8.2* 8.5* 8.3* 8.8* 8.1*  MG 2.3  --   --   --   --   PHOS 5.0*  --   --   --   --     GFR: Estimated Creatinine Clearance: 51.2 mL/min (A) (by C-G formula based on SCr of 1.37 mg/dL (H)). Liver Function Tests: Recent Labs  Lab 02/08/22 0443 02/09/22 0405 02/10/22 0437 02/11/22 0405 02/12/22 0349  AST 32 '26 18 17 '$ 13*  ALT '23 21 16 16 12  '$ ALKPHOS 89 89 78 81 69  BILITOT 0.3 0.5 0.4 0.6 0.3  PROT 6.5 6.8 6.2* 7.2 6.6  ALBUMIN 2.9* 2.9* 2.6* 3.2* 2.8*    Recent Labs  Lab 02/07/22 2049  LIPASE 36    No results for input(s): "AMMONIA" in the last 168 hours. Coagulation Profile: No results for input(s): "INR", "PROTIME" in the last 168 hours. Cardiac Enzymes: No results for  input(s): "CKTOTAL", "CKMB", "CKMBINDEX", "TROPONINI" in the last 168 hours. BNP (last 3 results) No results for input(s): "PROBNP" in the last 8760 hours. HbA1C: No results for input(s): "HGBA1C" in the last 72 hours. CBG: No results for input(s): "GLUCAP" in the last 168 hours. Lipid Profile: No results for input(s): "CHOL", "HDL", "LDLCALC", "TRIG", "CHOLHDL", "LDLDIRECT" in the last 72 hours. Thyroid Function Tests: No results for input(s): "TSH", "T4TOTAL", "FREET4", "T3FREE", "THYROIDAB" in the last 72 hours. Anemia Panel: Recent Labs    02/11/22 0405 02/12/22 0349  FERRITIN 40 33    Sepsis Labs: Recent Labs  Lab 02/07/22 2049 02/07/22 2346  PROCALCITON  --  0.23  LATICACIDVEN 1.5  --      Recent Results (from the past 240 hour(s))  Resp Panel by RT-PCR (Flu A&B, Covid) Anterior Nasal Swab     Status: Abnormal   Collection Time: 02/07/22  8:24 PM   Specimen: Anterior Nasal Swab  Result Value Ref Range Status   SARS Coronavirus 2 by RT PCR POSITIVE (A) NEGATIVE Final  Comment: (NOTE) SARS-CoV-2 target nucleic acids are DETECTED.  The SARS-CoV-2 RNA is generally detectable in upper respiratory specimens during the acute phase of infection. Positive results are indicative of the presence of the identified virus, but do not rule out bacterial infection or co-infection with other pathogens not detected by the test. Clinical correlation with patient history and other diagnostic information is necessary to determine patient infection status. The expected result is Negative.  Fact Sheet for Patients: EntrepreneurPulse.com.au  Fact Sheet for Healthcare Providers: IncredibleEmployment.be  This test is not yet approved or cleared by the [place] FDA and  has been authorized for detection and/or diagnosis of SARS-CoV-2 by FDA under an Emergency Use Authorization (EUA).  This EUA will remain in effect (meaning this test can be  used) for the duration of  the COVID-19 declaration under Section 564(b)(1) of the A ct, 21 U.S.C. section 360bbb-3(b)(1), unless the authorization is terminated or revoked sooner.     Influenza A by PCR NEGATIVE NEGATIVE Final   Influenza B by PCR NEGATIVE NEGATIVE Final    Comment: (NOTE) The Xpert Xpress SARS-CoV-2/FLU/RSV plus assay is intended as an aid in the diagnosis of influenza from Nasopharyngeal swab specimens and should not be used as a sole basis for treatment. Nasal washings and aspirates are unacceptable for Xpert Xpress SARS-CoV-2/FLU/RSV testing.  Fact Sheet for Patients: EntrepreneurPulse.com.au  Fact Sheet for Healthcare Providers: IncredibleEmployment.be  This test is not yet approved or cleared by the [place] FDA and has been authorized for detection and/or diagnosis of SARS-CoV-2 by FDA under an Emergency Use Authorization (EUA). This EUA will remain in effect (meaning this test can be used) for the duration of the COVID-19 declaration under Section 564(b)(1) of the Act, 21 U.S.C. section 360bbb-3(b)(1), unless the authorization is terminated or revoked.  Performed at [facility], [place] [address] [place], [place] [address]   Blood culture (routine x 2)     Status: None   Collection Time: 02/07/22  8:39 PM   Specimen: BLOOD  Result Value Ref Range Status   Specimen Description   Final    BLOOD BLOOD RIGHT ARM Performed at [facility] [address], [place], [place] [address]    Special Requests   Final    BOTTLES DRAWN AEROBIC AND ANAEROBIC Blood Culture results may not be optimal due to an inadequate volume of blood received in culture bottles Performed at [facility] [address] [place], [place] [address]    Culture   Final    NO GROWTH 5 DAYS Performed at [facility], [place] [address] [place], [place] [address]    Report Status  02/12/2022 FINAL  Final  Blood culture (routine x 2)     Status: None   Collection Time: 02/07/22  8:49 PM   Specimen: BLOOD  Result Value Ref Range Status   Specimen Description   Final    BLOOD BLOOD LEFT ARM Performed at [facility] [address] [place], [place] [address]    Special Requests   Final    BOTTLES DRAWN AEROBIC AND ANAEROBIC Blood Culture results may not be optimal due to an inadequate volume of blood received in culture bottles Performed at [facility] [address], [place],  [address]    Culture   Final    NO GROWTH 5 DAYS Performed at [facility], [place] [address], [place],  [address]    Report  Status 02/12/2022 FINAL  Final         Radiology Studies: DG Abd Portable 1V  Result Date: 02/12/2022 CLINICAL DATA:  Abdominal distension EXAM: PORTABLE ABDOMEN - 1 VIEW COMPARISON:  Previous studies including the examination of 02/11/2022 FINDINGS: There is interval decrease in degree of small-bowel dilation in left upper abdomen. There is marked gaseous distention of colon in mid abdomen with possible worsening. In the previous CT examinations, there was evidence of left hemicolectomy. Surgical clips are seen in left side of abdomen. IMPRESSION: There is marked gaseous distention of colon in mid abdomen with possible worsening. There is no significant small bowel dilation. Findings suggest ileus or distal colonic obstruction. Electronically Signed   By: Elmer Picker M.D.   On: 02/12/2022 08:47   DG Abd Portable 1V  Result Date: 02/11/2022 CLINICAL DATA:  Abdominal pain and distention.  [id number] EXAM: PORTABLE ABDOMEN - 1 VIEW COMPARISON:  Abdomen film 02/09/2022, CT without contrast 02/07/2022 and 01/12/2022 FINDINGS: There is a partial left hemicolectomy with primary anastomosis and a chronically dilated transverse colon. Dilatation of the transverse colon is increased today, on September 8 was 10 cm  now is 12 cm. There is continued dilatation of left upper quadrant small bowel which is unchanged with small bowel segments up to 4.5 cm in the left upper quadrant. No other new abnormality is seen. There is no supine evidence for free air. Numerous surgical clips left hemiabdomen. IMPRESSION: Increased dilatation in the transverse colon now 12 cm, previously 10 cm with persisting small-bowel dilatation left upper quadrant small bowel. Electronically Signed   By: Telford Nab M.D.   On: 02/11/2022 07:45        Scheduled Meds:  dexamethasone (DECADRON) injection  6 mg Intravenous Q24H   divalproex  1,000 mg Oral QHS   haloperidol  2 mg Oral TID   heparin  5,000 Units Subcutaneous Q8H   lactulose  20 g Oral TID   lithium carbonate  150 mg Oral Daily   lithium carbonate  300 mg Oral QHS   pantoprazole (PROTONIX) IV  40 mg Intravenous Q12H   polyethylene glycol  17 g Oral BID   propranolol  10 mg Oral TID   QUEtiapine  400 mg Oral BID   senna-docusate  2 tablet Oral BID   sodium chloride flush  3 mL Intravenous Q12H   valbenazine  40 mg Oral Daily   zolpidem  10 mg Oral QHS   Continuous Infusions:          Aline August, MD [facility] 02/12/2022, 10:24 AM

## 2022-02-12 NOTE — Progress Notes (Signed)
Physical Therapy Treatment Patient Details Name: Nicholas Oconnell MRN: [id number] DOB: [birth year] Today's Date: 02/12/2022   History of Present Illness 61 y.o. 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  Pt requiring approx min assist for mobility today.    Recommendations for follow up therapy are one component of a multi-disciplinary discharge planning process, led by the attending physician.  Recommendations may be updated based on patient status, additional functional criteria and insurance authorization.  Follow Up Recommendations  No PT follow up     Assistance Recommended at Discharge Frequent or constant Supervision/Assistance  Patient can return home with the following A little help with walking and/or transfers;A little help with bathing/dressing/bathroom;Assistance with cooking/housework;Assist for transportation;Help with stairs or ramp for entrance;Direct supervision/assist for financial management;Direct supervision/assist for medications management   Equipment Recommendations  None recommended by PT    Recommendations for Other Services       Precautions / Restrictions       Mobility  Bed Mobility Overal bed mobility: Needs Assistance Bed Mobility: Supine to Sit     Supine to sit: Min guard, HOB elevated, Min assist     General bed mobility comments: min assist to scoot to EOB only    Transfers Overall transfer level: Needs assistance Equipment used: Rolling walker (2 wheels) Transfers: Sit to/from Stand Sit to Stand: Min assist           General transfer comment: cues for technique including weight shift, hip/knee extension    Ambulation/Gait Ambulation/Gait  assistance: Min guard, Min assist, +2 safety/equipment Gait Distance (Feet): 80 Feet Assistive device: Rolling walker (2 wheels) Gait Pattern/deviations: Step-through pattern, Decreased stride length, Trunk flexed, Knee flexed in stance - left, Knee flexed in stance - right       General Gait Details: pt with ataxic LE  movement, pt chose to use RW and wear his shoes today so improved control observed with RW vs rollator (last session); recliner following for safety however not needed   Stairs             Wheelchair Mobility    Modified Rankin (Stroke Patients Only)       Balance                                            Cognition Arousal/Alertness: Awake/alert Behavior During Therapy: WFL for tasks assessed/performed Overall Cognitive Status: History of cognitive impairments - at baseline                                          Exercises      General Comments        Pertinent Vitals/Pain Pain Assessment Pain Assessment: No/denies pain    Home Living                          Prior Function            PT Goals (current goals can now be found in the care plan section) Progress towards  PT goals: Progressing toward goals    Frequency    Min 3X/week      PT Plan Current plan remains appropriate    Co-evaluation              AM-PAC PT "6 Clicks" Mobility   Outcome Measure  Help needed turning from your back to your side while in a flat bed without using bedrails?: A Little Help needed moving from lying on your back to sitting on the side of a flat bed without using bedrails?: A Little Help needed moving to and from a bed to a chair (including a wheelchair)?: A Little Help needed standing up from a chair using your arms (e.g., wheelchair or bedside chair)?: A Little Help needed to walk in hospital room?: A Little Help needed climbing 3-5 steps with a railing? : A Lot 6 Click Score: 17    End  of Session Equipment Utilized During Treatment: Gait belt Activity Tolerance: Patient tolerated treatment well Patient left: in chair;with call bell/phone within reach;with chair alarm set Nurse Communication: Mobility status PT Visit Diagnosis: Other abnormalities of gait and mobility (R26.89);Difficulty in walking, not elsewhere classified (R26.2)     Time: 9233-0076 PT Time Calculation (min) (ACUTE ONLY): 20 min  Charges:  $Gait Training: 8-22 mins                    Arlyce Dice, DPT Physical Therapist Acute Rehabilitation Services Preferred contact method: Secure Chat Weekend Pager Only: [phone] Office: [place] 02/12/2022, 3:39 PM

## 2022-02-13 ENCOUNTER — Inpatient Hospital Stay (HOSPITAL_COMMUNITY): Payer: Medicaid Other

## 2022-02-13 DIAGNOSIS — U071 COVID-19: Secondary | ICD-10-CM | POA: Diagnosis not present

## 2022-02-13 DIAGNOSIS — F250 Schizoaffective disorder, bipolar type: Secondary | ICD-10-CM | POA: Diagnosis not present

## 2022-02-13 DIAGNOSIS — K5900 Constipation, unspecified: Secondary | ICD-10-CM | POA: Diagnosis not present

## 2022-02-13 DIAGNOSIS — N179 Acute kidney failure, unspecified: Secondary | ICD-10-CM | POA: Diagnosis not present

## 2022-02-13 LAB — BASIC METABOLIC PANEL
Anion gap: 8 (ref 5–15)
BUN: 19 mg/dL (ref 8–23)
CO2: 28 mmol/L (ref 22–32)
Calcium: 8.8 mg/dL — ABNORMAL LOW (ref 8.9–10.3)
Chloride: 108 mmol/L (ref 98–111)
Creatinine, Ser: 1.43 mg/dL — ABNORMAL HIGH (ref 0.61–1.24)
GFR, Estimated: 55 mL/min — ABNORMAL LOW (ref >60)
Glucose, Bld: 91 mg/dL (ref 70–99)
Potassium: 4.4 mmol/L (ref 3.5–5.1)
Sodium: 144 mmol/L (ref 135–145)

## 2022-02-13 LAB — MAGNESIUM: Magnesium: 2.8 mg/dL — ABNORMAL HIGH (ref 1.7–2.4)

## 2022-02-13 MED ORDER — FLEET ENEMA 7-19 GM/118ML RE ENEM
1.0000 | ENEMA | Freq: Once | RECTAL | Status: AC
  Administered 2022-02-13: 1 via RECTAL

## 2022-02-13 MED ORDER — PANTOPRAZOLE SODIUM 40 MG PO TBEC
40.0000 mg | DELAYED_RELEASE_TABLET | Freq: Two times a day (BID) | ORAL | Status: DC
  Administered 2022-02-13 – 2022-02-14 (×2): 40 mg via ORAL
  Filled 2022-02-13 (×2): qty 1

## 2022-02-13 NOTE — Progress Notes (Signed)
PROGRESS NOTE    Nicholas Oconnell  IZT:[id number] DOB: Feb 23, 1961 DOA: 02/07/2022 PCP: Neale Burly, MD   Brief Narrative:  61 y.o. 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  He presented on 02/07/2022 with chills and nausea and vomiting.  On presentation, he was febrile, slightly tachypneic, tachycardic.  Creatinine was 2.33, troponin 77, COVID-19 PCR was positive.  Chest x-ray negative for acute cardiopulmonary disease.  CT of abdomen and pelvis findings were similar to prior.  He has completed course of remdesivir; currently on Decadron.  On room air.  Has had issues with intermittent nausea/vomiting and abdominal distention requiring enema.  Assessment & Plan:   Acute kidney injury on chronic kidney disease stage IIIa -Possibly prerenal from nausea and vomiting.  Baseline creatinine of 1.45.  Presented with creatinine of 2.33. -Treated with IV fluids and subsequently discontinued.  Creatinine 1.37 on 02/12/2022 Labs pending for today. -Repeat a.m. labs.    COVID-19 infection -COVID-19 Labs  Recent Labs    02/11/22 0405 02/12/22 0349  DDIMER 0.56* 0.34  FERRITIN 40 33  CRP 1.3* 2.2*     Lab Results  Component Value Date   SARSCOV2NAA POSITIVE (A) 02/07/2022   Declo NEGATIVE 06/28/2021   -Currently on room air.    Currently on Decadron, complete 10-day course of steroids.  Completed remdesivir -Continue isolation  Chronic constipation, history of Ogilvie syndrome -Recent admission for acute on chronic constipation eventually requiring colonoscopy.  GI had recommended to continue bowel regimen and daily enema if needed. -Continue current  bowel regimen.  -X-ray on 02/12/2022 showed worsening colonic distention although patient had good bowel movement with enema and Dulcolax yesterday.  Patient had a large bowel movement after enema on 02/12/2022 as per nursing staff.   -Follow x-ray of the abdomen today.  Might have to give another dose of enema today.  Schizoaffective disorder/cognitive impairment -Patient was seen by psychiatry during last admission and medication changes were made.  Psychiatry has resumed valbenazine.  Continue  lithium/Seroquel/divalproex/Haldol.  Outpatient follow-up with psychiatry  Elevated troponin From troponin 77 and subsequently 84.  No chest pain.  EKG showed no changes.  2D echo showed EF of 55 to 60% and grade 1 diastolic dysfunction.  No further work-up needed.  Outpatient follow-up with cardiology if needed.  Hypertension - Continue propranolol  Physical deconditioning -PT following   DVT prophylaxis: Heparin Code Status: Full Family Communication: None at bedside Disposition Plan: Status is: Inpatient Remains inpatient appropriate because: Of severity of illness.  Will return back to group home in the next 1 to 2 days if remains clinically stable.  Consultants: psychiatry  Procedures: 2D echo  Antimicrobials: None   Subjective: Patient seen and examined at bedside.  Poor historian.  Nursing staff reported that patient had good bowel movement yesterday after enema.  No agitation, fever or seizures reported.   Objective: Vitals:   02/12/22 1526 02/12/22 2204 02/13/22 0500 02/13/22 0656  BP: 117/79 (!) 108/57  (!) 139/100  Pulse: 73 62  65  Resp: '18 20  20  '$ Temp: 98.5 F (36.9 C) 98.7 F (37.1 C)  97.7 F (36.5 C)  TempSrc: Oral Oral  Oral  SpO2: 90% 100%  96%  Weight:   63.3 kg   Height:  Intake/Output Summary (Last 24 hours) at 02/13/2022 0747 Last data filed at 02/13/2022 0645 Gross per 24 hour  Intake 280 ml  Output 850 ml  Net -570 ml    Filed Weights    02/08/22 0109 02/13/22 0500  Weight: 64.8 kg 63.3 kg    Examination:  General: On room air currently.  No acute distress.  Chronically ill and deconditioned looking. ENT/neck: No masses palpable in the neck with no JVD elevation respiratory: Decreased breath sounds at bases bilaterally with some crackles  CVS: S1-S2 heard; rate controlled mostly  abdominal: Soft, nontender, still distended but slightly improved compared; no organomegaly, normal bowel sounds heard extremities: Trace lower extremity edema present; no cyanosis CNS: Awake; very slow to respond.  Poor historian.  No focal neurologic deficit.  Able to move extremities.  Has some upper extremity tremors. Lymph: No cervical lymphadenopathy palpable skin: No ecchymosis/rashes  psych: Affect is extremely flat.  Not agitated.  Does not participate in conversation much.   Musculoskeletal: No obvious joint swelling/deformity   Data Reviewed: I have personally reviewed following labs and imaging studies  CBC: Recent Labs  Lab 02/08/22 0443 02/09/22 0405 02/10/22 0437 02/11/22 0405 02/12/22 0349  WBC 5.9 6.5 4.6 8.7 7.3  NEUTROABS 3.5 4.6 2.9 6.3 4.7  HGB 11.4* 12.5* 11.7* 12.4* 11.6*  HCT 36.7* 40.8 37.5* 39.6 37.8*  MCV 95.6 95.1 94.2 94.3 94.3  PLT 149* 202 187 216 683    Basic Metabolic Panel: Recent Labs  Lab 02/08/22 0443 02/09/22 0405 02/10/22 0437 02/11/22 0405 02/12/22 0349  NA 140 144 143 145 142  K 4.3 5.1 4.6 4.3 4.1  CL 107 110 112* 109 108  CO2 '23 27 25 27 26  '$ GLUCOSE 90 93 94 99 88  BUN 24* '20 20 19 16  '$ CREATININE 1.87* 1.31* 1.30* 1.46* 1.37*  CALCIUM 8.2* 8.5* 8.3* 8.8* 8.1*  MG 2.3  --   --   --   --   PHOS 5.0*  --   --   --   --     GFR: Estimated Creatinine Clearance: 50.1 mL/min (A) (by C-G formula based on SCr of 1.37 mg/dL (H)). Liver Function Tests: Recent Labs  Lab 02/08/22 0443 02/09/22 0405 02/10/22 0437 02/11/22 0405 02/12/22 0349  AST 32 '26 18 17 '$ 13*  ALT '23 21 16 16 12   '$ ALKPHOS 89 89 78 81 69  BILITOT 0.3 0.5 0.4 0.6 0.3  PROT 6.5 6.8 6.2* 7.2 6.6  ALBUMIN 2.9* 2.9* 2.6* 3.2* 2.8*    Recent Labs  Lab 02/07/22 2049  LIPASE 36    No results for input(s): "AMMONIA" in the last 168 hours. Coagulation Profile: No results for input(s): "INR", "PROTIME" in the last 168 hours. Cardiac Enzymes: No results for input(s): "CKTOTAL", "CKMB", "CKMBINDEX", "TROPONINI" in the last 168 hours. BNP (last 3 results) No results for input(s): "PROBNP" in the last 8760 hours. HbA1C: No results for input(s): "HGBA1C" in the last 72 hours. CBG: No results for input(s): "GLUCAP" in the last 168 hours. Lipid Profile: No results for input(s): "CHOL", "HDL", "LDLCALC", "TRIG", "CHOLHDL", "LDLDIRECT" in the last 72 hours. Thyroid Function Tests: No results for input(s): "TSH", "T4TOTAL", "FREET4", "T3FREE", "THYROIDAB" in the last 72 hours. Anemia Panel: Recent Labs    02/11/22 0405 02/12/22 0349  FERRITIN 40 33    Sepsis Labs: Recent Labs  Lab 02/07/22 2049 02/07/22 2346  PROCALCITON  --  0.23  LATICACIDVEN 1.5  --  Recent Results (from the past 240 hour(s))  Resp Panel by RT-PCR (Flu A&B, Covid) Anterior Nasal Swab     Status: Abnormal   Collection Time: 02/07/22  8:24 PM   Specimen: Anterior Nasal Swab  Result Value Ref Range Status   SARS Coronavirus 2 by RT PCR POSITIVE (A) NEGATIVE Final    Comment: (NOTE) SARS-CoV-2 target nucleic acids are DETECTED.  The SARS-CoV-2 RNA is generally detectable in upper respiratory specimens during the acute phase of infection. Positive results are indicative of the presence of the identified virus, but do not rule out bacterial infection or co-infection with other pathogens not detected by the test. Clinical correlation with patient history and other diagnostic information is necessary to determine patient infection status. The expected result is Negative.  Fact Sheet for  Patients: EntrepreneurPulse.com.au  Fact Sheet for Healthcare Providers: IncredibleEmployment.be  This test is not yet approved or cleared by the [place] FDA and  has been authorized for detection and/or diagnosis of SARS-CoV-2 by FDA under an Emergency Use Authorization (EUA).  This EUA will remain in effect (meaning this test can be used) for the duration of  the COVID-19 declaration under Section 564(b)(1) of the A ct, 21 U.S.C. section 360bbb-3(b)(1), unless the authorization is terminated or revoked sooner.     Influenza A by PCR NEGATIVE NEGATIVE Final   Influenza B by PCR NEGATIVE NEGATIVE Final    Comment: (NOTE) The Xpert Xpress SARS-CoV-2/FLU/RSV plus assay is intended as an aid in the diagnosis of influenza from Nasopharyngeal swab specimens and should not be used as a sole basis for treatment. Nasal washings and aspirates are unacceptable for Xpert Xpress SARS-CoV-2/FLU/RSV testing.  Fact Sheet for Patients: EntrepreneurPulse.com.au  Fact Sheet for Healthcare Providers: IncredibleEmployment.be  This test is not yet approved or cleared by the [place] FDA and has been authorized for detection and/or diagnosis of SARS-CoV-2 by FDA under an Emergency Use Authorization (EUA). This EUA will remain in effect (meaning this test can be used) for the duration of the COVID-19 declaration under Section 564(b)(1) of the Act, 21 U.S.C. section 360bbb-3(b)(1), unless the authorization is terminated or revoked.  Performed at [facility], [place] [address], [place], [place] [address]   Blood culture (routine x 2)     Status: None   Collection Time: 02/07/22  8:39 PM   Specimen: BLOOD  Result Value Ref Range Status   Specimen Description   Final    BLOOD BLOOD RIGHT ARM Performed at [facility] [address] [place], [place] [address]    Special Requests    Final    BOTTLES DRAWN AEROBIC AND ANAEROBIC Blood Culture results may not be optimal due to an inadequate volume of blood received in culture bottles Performed at [facility] [address] [place], [place] [address]    Culture   Final    NO GROWTH 5 DAYS Performed at [facility], [place] [address], [place], [place] [address]    Report Status 02/12/2022 FINAL  Final  Blood culture (routine x 2)     Status: None   Collection Time: 02/07/22  8:49 PM   Specimen: BLOOD  Result Value Ref Range Status   Specimen Description   Final    BLOOD BLOOD LEFT ARM Performed at [facility] [address] [place], [place] [address]    Special Requests   Final    BOTTLES DRAWN AEROBIC AND ANAEROBIC Blood Culture results  may not be optimal due to an inadequate volume of blood received in culture bottles Performed at [facility] [address], [place], [place] [address]    Culture   Final    NO GROWTH 5 DAYS Performed at [facility], [place] [address] [place], [place] [address]    Report Status 02/12/2022 FINAL  Final         Radiology Studies: DG Abd Portable 1V  Result Date: 02/12/2022 CLINICAL DATA:  Abdominal distension EXAM: PORTABLE ABDOMEN - 1 VIEW COMPARISON:  Previous studies including the examination of 02/11/2022 FINDINGS: There is interval decrease in degree of small-bowel dilation in left upper abdomen. There is marked gaseous distention of colon in mid abdomen with possible worsening. In the previous CT examinations, there was evidence of left hemicolectomy. Surgical clips are seen in left side of abdomen. IMPRESSION: There is marked gaseous distention of colon in mid abdomen with possible worsening. There is no significant small bowel dilation. Findings suggest ileus or distal colonic obstruction. Electronically Signed   By: Elmer Picker M.D.   On: 02/12/2022 08:47        Scheduled Meds:   dexamethasone (DECADRON) injection  6 mg Intravenous Q24H   divalproex  1,000 mg Oral QHS   haloperidol  2 mg Oral TID   heparin  5,000 Units Subcutaneous Q8H   lactulose  20 g Oral TID   lithium carbonate  150 mg Oral Daily   lithium carbonate  300 mg Oral QHS   pantoprazole (PROTONIX) IV  40 mg Intravenous Q12H   polyethylene glycol  17 g Oral BID   propranolol  10 mg Oral TID   QUEtiapine  400 mg Oral BID   senna-docusate  2 tablet Oral BID   sodium chloride flush  3 mL Intravenous Q12H   valbenazine  40 mg Oral Daily   zolpidem  10 mg Oral QHS   Continuous Infusions:          Aline August, MD [facility] 02/13/2022, 7:47 AM

## 2022-02-14 ENCOUNTER — Inpatient Hospital Stay (HOSPITAL_COMMUNITY): Payer: Medicaid Other

## 2022-02-14 DIAGNOSIS — U071 COVID-19: Secondary | ICD-10-CM | POA: Diagnosis not present

## 2022-02-14 DIAGNOSIS — N179 Acute kidney failure, unspecified: Secondary | ICD-10-CM | POA: Diagnosis not present

## 2022-02-14 DIAGNOSIS — K5900 Constipation, unspecified: Secondary | ICD-10-CM | POA: Diagnosis not present

## 2022-02-14 DIAGNOSIS — I10 Essential (primary) hypertension: Secondary | ICD-10-CM | POA: Diagnosis not present

## 2022-02-14 LAB — CBC WITH DIFFERENTIAL/PLATELET
Abs Immature Granulocytes: 0.43 10*3/uL — ABNORMAL HIGH (ref 0.00–0.07)
Basophils Absolute: 0 10*3/uL (ref 0.0–0.1)
Basophils Relative: 0 %
Eosinophils Absolute: 0 10*3/uL (ref 0.0–0.5)
Eosinophils Relative: 0 %
HCT: 37.4 % — ABNORMAL LOW (ref 39.0–52.0)
Hemoglobin: 12.1 g/dL — ABNORMAL LOW (ref 13.0–17.0)
Immature Granulocytes: 5 %
Lymphocytes Relative: 19 %
Lymphs Abs: 1.7 10*3/uL (ref 0.7–4.0)
MCH: 29.7 pg (ref 26.0–34.0)
MCHC: 32.4 g/dL (ref 30.0–36.0)
MCV: 91.7 fL (ref 80.0–100.0)
Monocytes Absolute: 0.7 10*3/uL (ref 0.1–1.0)
Monocytes Relative: 7 %
Neutro Abs: 6.4 10*3/uL (ref 1.7–7.7)
Neutrophils Relative %: 69 %
Platelets: 290 10*3/uL (ref 150–400)
RBC: 4.08 MIL/uL — ABNORMAL LOW (ref 4.22–5.81)
RDW: 13.9 % (ref 11.5–15.5)
WBC: 9.3 10*3/uL (ref 4.0–10.5)
nRBC: 0 % (ref 0.0–0.2)

## 2022-02-14 LAB — C-REACTIVE PROTEIN: CRP: 0.8 mg/dL (ref <1.0)

## 2022-02-14 LAB — BASIC METABOLIC PANEL
Anion gap: 8 (ref 5–15)
BUN: 22 mg/dL (ref 8–23)
CO2: 25 mmol/L (ref 22–32)
Calcium: 8.2 mg/dL — ABNORMAL LOW (ref 8.9–10.3)
Chloride: 109 mmol/L (ref 98–111)
Creatinine, Ser: 1.13 mg/dL (ref 0.61–1.24)
GFR, Estimated: >60 mL/min (ref >60)
Glucose, Bld: 143 mg/dL — ABNORMAL HIGH (ref 70–99)
Potassium: 3.9 mmol/L (ref 3.5–5.1)
Sodium: 142 mmol/L (ref 135–145)

## 2022-02-14 LAB — MAGNESIUM: Magnesium: 2.6 mg/dL — ABNORMAL HIGH (ref 1.7–2.4)

## 2022-02-14 MED ORDER — SENNA-DOCUSATE SODIUM 8.6-50 MG PO TABS
2.0000 | ORAL_TABLET | Freq: Two times a day (BID) | ORAL | 2 refills | Status: AC
Start: 2022-02-14 — End: 2022-05-15

## 2022-02-14 MED ORDER — FLEET ENEMA 7-19 GM/118ML RE ENEM: 1.0000 | ENEMA | Freq: Every day | RECTAL | 3 refills | Status: AC | PRN

## 2022-02-14 MED ORDER — LACTULOSE 10 GM/15ML PO SOLN: 30.0000 g | ORAL | 3 refills | Status: AC

## 2022-02-14 MED ORDER — PANTOPRAZOLE SODIUM 40 MG PO TBEC: 40.0000 mg | DELAYED_RELEASE_TABLET | Freq: Two times a day (BID) | ORAL | 2 refills | Status: AC

## 2022-02-14 MED ORDER — POLYETHYLENE GLYCOL 3350 17 G PO PACK
17.0000 g | PACK | Freq: Two times a day (BID) | ORAL | 2 refills | Status: AC
Start: 2022-02-14 — End: 2022-05-15

## 2022-02-14 NOTE — Discharge Summary (Signed)
Physician Discharge Summary  Nicholas Oconnell JXB:[id number] DOB: November 23, 1960 DOA: 02/07/2022  PCP: Neale Burly, MD  Admit date: 02/07/2022 Discharge date: 02/14/2022  Admitted From: Group home Disposition: Group home  Recommendations for Outpatient Follow-up:  Follow up with PCP in 1-2 weeks Follow-up with gastroenterology as needed Continue MiraLAX and Senokot to twice daily, lactulose twice daily and if needed 3 times daily, and fleets enema as needed for history of chronic severe constipation  Home Health: None identified by physical therapy Equipment/Devices: None  Discharge Condition: Stable [place] CODE STATUS: Full Diet recommendation: Heart healthy diet  History of present illness:  Nicholas Oconnell is a 61 year old male with past medical history significant for schizoaffective disorder, CKD stage IIIa, essential hypertension, renal cell carcinoma s/p partial right nephrectomy, 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  On presentation, patient was noted to be febrile, slightly tachypneic and tachycardic.  Creatinine was elevated 2.33 with troponin 77, COVID-19 PCR positive.  Chest x-ray negative for acute cardiopulmonary disease process.  CT abdomen/pelvis with no convincing evidence for bowel obstruction, air and fluid distention of the entire colon which is similar in appearance to prior studies, no dilated small bowel or bowel wall thickening.  Patient was started on IV fluids, remdesivir.  Hospitalist service consulted for further evaluation management.  Hospital course:  Acute renal failure on CKD stage IIIa Patient presenting with a creatinine of 2.33, baseline 1.45.  Etiology likely secondary to nausea/vomiting, prerenal azotemia.  Patient was started on IV fluid hydration with improvement of creatinine to 1.13 at time of discharge.  Continue to encourage increased oral intake.  Recommend repeat BMP 1  week.  COVID-19 viral infection Patient was febrile on admission, chest x-ray unrevealing.  Not hypoxic.  Patient completed 3-day course of remdesivir and course of IV Decadron.  SPO2 100% on room air at discharge.  Chronic constipation History of Ogilvie syndrome Recently admitted for similar constipation issues requiring colonoscopy.  CT abdomen/pelvis demonstrates air and fluid distention of the entire colon which is similar to appearance of prior studies, no dilated small bowel or bowel wall thickening.  Patient was restarted on aggressive bowel regimen to include MiraLAX twice daily, lactulose twice daily, Senokot twice daily and fleets enema as needed.  Patient continued to have good bowel movements.  Recommend continue aggressive treatment outpatient.  Outpatient follow-up with GI as needed.  Schizoaffective disorder Cognitive impairment Patient was previously seen by psychiatry during recent hospitalization and medication changes were made.  Outpatient follow-up with [facility] psychiatry.  Elevated troponin High-sensitivity troponin elevated 77, followed by 84.  EKG with no dynamic changes.  TTE with LVEF 13-0%, grade 1 diastolic dysfunction.  Patient denies chest pain.  No further work-up needed.  Outpatient follow-up with cardiology as needed.  Essential hypertension Amiloride 5 mg p.o. daily, propranolol 10 mg p.o. 3 times daily  Discharge Diagnoses:  Principal Problem:   Acute renal failure superimposed on stage 3a chronic kidney disease (HCC) Active Problems:   Schizoaffective disorder, bipolar type (HCC)   HTN (hypertension)   Constipation   COVID-19 virus infection    Discharge Instructions  Discharge Instructions     Call MD for:  difficulty breathing, headache or visual disturbances   Complete by: As directed    Call MD for:  extreme fatigue   Complete by: As directed    Call MD for:  persistant dizziness or light-headedness   Complete by: As directed  Call MD for:  persistant nausea and vomiting   Complete by: As directed    Call MD for:  severe uncontrolled pain   Complete by: As directed    Call MD for:  temperature >100.4   Complete by: As directed    Diet - low sodium heart healthy   Complete by: As directed    Increase activity slowly   Complete by: As directed       Allergies as of 02/14/2022       Reactions   Benzodiazepines Other (See Comments)   "Disinhibition," but not listed on the MAR   Clozapine Other (See Comments)   "Constipation - bowel obstruction," but not listed on the [facility]        Medication List     TAKE these medications    aMILoride 5 MG tablet Commonly known as: MIDAMOR Take 5 mg by mouth daily.   divalproex 500 MG 24 hr tablet Commonly known as: Depakote ER Take 2 tablets (1,000 mg total) by mouth at bedtime.   Ensure Take 1 Can by mouth 2 (two) times daily as needed (if 50% of meal is not consumed).   haloperidol 20 MG tablet Commonly known as: HALDOL Take 20 mg by mouth 3 (three) times daily. What changed: Another medication with the same name was removed. Continue taking this medication, and follow the directions you see here.   lactulose 10 GM/15ML solution Commonly known as: CHRONULAC Take 45 mLs (30 g total) by mouth See admin instructions. Take 30 grams (45 ml's) by mouth at 8 AM and 8 PM- may take an additional 30 grams once a day as needed for ongoing constipation   lithium carbonate 150 MG capsule Take 150-300 mg by mouth See admin instructions. Take 150 mg by mouth in the morning and 300 mg at bedtime   Melatonin 10 MG Caps Take 10 mg by mouth at bedtime.   OVER THE COUNTER MEDICATION Place 1 enema rectally See admin instructions. Tap water enema- Place 1 enema rectally once a day as needed for constipation   pantoprazole 40 MG tablet Commonly known as: PROTONIX Take 1 tablet (40 mg total) by mouth in the morning and at bedtime.   polyethylene glycol 17 g  packet Commonly known as: MIRALAX / GLYCOLAX Take 17 g by mouth 2 (two) times daily.   propranolol 10 MG tablet Commonly known as: INDERAL Take 10 mg by mouth 3 (three) times daily.   QUEtiapine 400 MG tablet Commonly known as: SEROQUEL Take 400 mg by mouth 2 (two) times daily. What changed: Another medication with the same name was removed. Continue taking this medication, and follow the directions you see here.   sennosides-docusate sodium 8.6-50 MG tablet Commonly known as: SENOKOT-S Take 2 tablets by mouth in the morning and at bedtime.   simethicone 125 MG chewable tablet Commonly known as: MYLICON Chew 295 mg by mouth every 6 (six) hours as needed for flatulence.   sodium phosphate 7-19 GM/118ML Enem Place 133 mLs (1 enema total) rectally daily as needed for moderate constipation (constipation). What changed:  medication strength how much to take reasons to take this   valbenazine 40 MG capsule Commonly known as: INGREZZA Take 1 capsule (40 mg total) by mouth daily.   Veltassa 8.4 g packet Generic drug: patiromer Take 8.4 g by mouth daily.   Vitamin D (Ergocalciferol) 1.25 MG (50000 UNIT) Caps capsule Commonly known as: DRISDOL Take 1 capsule (50,000 Units total) by mouth every 7 (  seven) days.   zolpidem 10 MG tablet Commonly known as: AMBIEN Take 10 mg by mouth at bedtime.        Follow-up Information     Neale Burly, MD. Schedule an appointment as soon as possible for a visit in 1 week(s).   Specialty: Family Medicine Contact information: [address] [place] [address] [phone]                Allergies  Allergen Reactions   Benzodiazepines Other (See Comments)    "Disinhibition," but not listed on the MAR    Clozapine Other (See Comments)    "Constipation - bowel obstruction," but not listed on the [facility]    Consultations: none   Procedures/Studies: DG Abd Portable 1V  Result Date: 02/14/2022 CLINICAL  DATA:  Colonic motility disorder. EXAM: PORTABLE ABDOMEN - 1 VIEW COMPARISON:  February 13, 2022. FINDINGS: Surgical clips are again noted left side of abdomen. Severe dilatation of the colon is again noted. No small bowel dilatation is noted IMPRESSION: Stable severe colonic dilatation. Electronically Signed   By: Marijo Conception M.D.   On: 02/14/2022 08:11   DG Abd Portable 1V  Result Date: 02/13/2022 CLINICAL DATA:  Ileus. EXAM: PORTABLE ABDOMEN - 1 VIEW COMPARISON:  February 12, 2022.  February 07, 2022. FINDINGS: Surgical clips are noted in the left side of the abdomen. Stable severe colonic dilatation is noted status post partial left colectomy. No definite small bowel dilatation is noted. Phleboliths are noted in the pelvis. IMPRESSION: Stable severe colonic dilatation is noted status post partial left colectomy. Electronically Signed   By: Marijo Conception M.D.   On: 02/13/2022 08:19   DG Abd Portable 1V  Result Date: 02/12/2022 CLINICAL DATA:  Abdominal distension EXAM: PORTABLE ABDOMEN - 1 VIEW COMPARISON:  Previous studies including the examination of 02/11/2022 FINDINGS: There is interval decrease in degree of small-bowel dilation in left upper abdomen. There is marked gaseous distention of colon in mid abdomen with possible worsening. In the previous CT examinations, there was evidence of left hemicolectomy. Surgical clips are seen in left side of abdomen. IMPRESSION: There is marked gaseous distention of colon in mid abdomen with possible worsening. There is no significant small bowel dilation. Findings suggest ileus or distal colonic obstruction. Electronically Signed   By: Elmer Picker M.D.   On: 02/12/2022 08:47   DG Abd Portable 1V  Result Date: 02/11/2022 CLINICAL DATA:  Abdominal pain and distention.  [id number] EXAM: PORTABLE ABDOMEN - 1 VIEW COMPARISON:  Abdomen film 02/09/2022, CT without contrast 02/07/2022 and 01/12/2022 FINDINGS: There is a partial left hemicolectomy with  primary anastomosis and a chronically dilated transverse colon. Dilatation of the transverse colon is increased today, on September 8 was 10 cm now is 12 cm. There is continued dilatation of left upper quadrant small bowel which is unchanged with small bowel segments up to 4.5 cm in the left upper quadrant. No other new abnormality is seen. There is no supine evidence for free air. Numerous surgical clips left hemiabdomen. IMPRESSION: Increased dilatation in the transverse colon now 12 cm, previously 10 cm with persisting small-bowel dilatation left upper quadrant small bowel. Electronically Signed   By: Telford Nab M.D.   On: 02/11/2022 07:45   DG Abd 1 View  Result Date: 02/09/2022 CLINICAL DATA:  Abdominal pain, vomiting EXAM: ABDOMEN - 1 VIEW COMPARISON:  Radiographs done on 01/12/2022, CT done on 02/07/2022 FINDINGS: There is gaseous dilation of the ascending and transverse  colon. There is mild dilation of small bowel loops. Stomach is not distended. There is interval decrease in degree of colonic distention. Surgical clips are seen in left side of abdomen. IMPRESSION: There is interval decrease in degree of colonic distention. Findings may suggest ileus. Electronically Signed   By: Elmer Picker M.D.   On: 02/09/2022 14:10   ECHOCARDIOGRAM COMPLETE  Result Date: 02/08/2022    ECHOCARDIOGRAM REPORT   Patient Name:   Nicholas Oconnell Date of Exam: 02/08/2022 Medical Rec #:  [id number]    Height:       68.0 in Accession #:    [id number]   Weight:       155.0 lb Date of Birth:  11-Jun-1960   BSA:          1.834 m Patient Age:    8 years     BP:           123/88 mmHg Patient Gender: M            HR:           66 bpm. Exam Location:  Inpatient Procedure: 2D Echo, Color Doppler and Cardiac Doppler Indications:    Elevated Troponin  History:        Patient has prior history of Echocardiogram examinations, most                 recent 06/29/2021. Risk Factors:Hypertension.  Sonographer:    Memory Argue  Referring Phys: [id number] [place]  1. Left ventricular ejection fraction, by estimation, is 55 to 60%. The left ventricle has normal function. The left ventricle has no regional wall motion abnormalities. There is mild left ventricular hypertrophy of the basal-septal segment. Left ventricular diastolic parameters are consistent with Grade I diastolic dysfunction (impaired relaxation).  2. Right ventricular systolic function is normal. The right ventricular size is normal.  3. The mitral valve is normal in structure. Trivial mitral valve regurgitation. No evidence of mitral stenosis.  4. The aortic valve is tricuspid. Aortic valve regurgitation is not visualized. Aortic valve sclerosis is present, with no evidence of aortic valve stenosis. FINDINGS  Left Ventricle: Left ventricular ejection fraction, by estimation, is 55 to 60%. The left ventricle has normal function. The left ventricle has no regional wall motion abnormalities. The left ventricular internal cavity size was normal in size. There is  mild left ventricular hypertrophy of the basal-septal segment. Left ventricular diastolic parameters are consistent with Grade I diastolic dysfunction (impaired relaxation). Right Ventricle: The right ventricular size is normal. No increase in right ventricular wall thickness. Right ventricular systolic function is normal. Left Atrium: Left atrial size was normal in size. Right Atrium: Right atrial size was normal in size. Pericardium: There is no evidence of pericardial effusion. Mitral Valve: The mitral valve is normal in structure. Trivial mitral valve regurgitation. No evidence of mitral valve stenosis. Tricuspid Valve: The tricuspid valve is normal in structure. Tricuspid valve regurgitation is not demonstrated. No evidence of tricuspid stenosis. Aortic Valve: The aortic valve is tricuspid. Aortic valve regurgitation is not visualized. Aortic valve sclerosis is present, with no evidence of aortic  valve stenosis. Aortic valve mean gradient measures 3.0 mmHg. Aortic valve peak gradient measures 3.9  mmHg. Aortic valve area, by VTI measures 1.91 cm. Pulmonic Valve: The pulmonic valve was normal in structure. Pulmonic valve regurgitation is trivial. No evidence of pulmonic stenosis. Aorta: The aortic root is normal in size and structure. Venous: The inferior vena cava was not well  visualized. IAS/Shunts: The interatrial septum was not well visualized.  LEFT VENTRICLE PLAX 2D LVIDd:         3.90 cm LVIDs:         2.60 cm LV PW:         1.10 cm LV IVS:        1.40 cm LVOT diam:     1.90 cm LV SV:         44 LV SV Index:   24 LVOT Area:     2.84 cm  RIGHT VENTRICLE RV S prime:     12.70 cm/s TAPSE (M-mode): 1.9 cm LEFT ATRIUM             Index        RIGHT ATRIUM           Index LA diam:        3.60 cm 1.96 cm/m   RA Area:     12.00 cm LA Vol (A2C):   57.0 ml 31.08 ml/m  RA Volume:   25.80 ml  14.07 ml/m LA Vol (A4C):   54.0 ml 29.45 ml/m LA Biplane Vol: 55.2 ml 30.10 ml/m  AORTIC VALVE AV Area (Vmax):    2.35 cm AV Area (Vmean):   2.09 cm AV Area (VTI):     1.91 cm AV Vmax:           99.00 cm/s AV Vmean:          75.400 cm/s AV VTI:            0.231 m AV Peak Grad:      3.9 mmHg AV Mean Grad:      3.0 mmHg LVOT Vmax:         82.20 cm/s LVOT Vmean:        55.600 cm/s LVOT VTI:          0.156 m LVOT/AV VTI ratio: 0.68  AORTA Ao Root diam: 3.00 cm MITRAL VALVE MV Area (PHT): 2.66 cm    SHUNTS MV Decel Time: 285 msec    Systemic VTI:  0.16 m MV E velocity: 53.60 cm/s  Systemic Diam: 1.90 cm MV A velocity: 77.10 cm/s MV E/A ratio:  0.70 MV A Prime:    7.2 cm/s Kirk Ruths MD Electronically signed by Kirk Ruths MD Signature Date/Time: 02/08/2022/3:42:24 PM    Final    CT ABDOMEN PELVIS WO CONTRAST  Result Date: 02/07/2022 CLINICAL DATA:  Nausea vomiting EXAM: CT ABDOMEN AND PELVIS WITHOUT CONTRAST TECHNIQUE: Multidetector CT imaging of the abdomen and pelvis was performed following the standard  protocol without IV contrast. RADIATION DOSE REDUCTION: This exam was performed according to the departmental dose-optimization program which includes automated exposure control, adjustment of the mA and/or kV according to patient size and/or use of iterative reconstruction technique. COMPARISON:  CT 01/12/2022, 01/03/2022, 06/28/2021 FINDINGS: Lower chest: Lung bases demonstrate minimal posterior foci of consolidation. No pleural effusion. Small hiatal hernia. Hepatobiliary: No focal liver abnormality is seen. No gallstones, gallbladder wall thickening, or biliary dilatation. Pancreas: Unremarkable. No pancreatic ductal dilatation or surrounding inflammatory changes. Spleen: Normal in size without focal abnormality. Adrenals/Urinary Tract: Adrenal glands are normal. Mild bilateral renal collecting system dilatation and prominence of the ureters. Slight bladder distension. Stomach/Bowel: The stomach is nonenlarged. Postsurgical changes of the colon consistent with partial left colectomy and reanastomosis.: Is chronically dilated and fluid-filled. There is no acute bowel wall thickening. Some fluid-filled small bowel but no small bowel distension. Vascular/Lymphatic: Nonaneurysmal aorta.  No suspicious lymph nodes Reproductive: Prostate is unremarkable. Other: Negative for pelvic effusion or free air Musculoskeletal: No acute osseous abnormality IMPRESSION: 1. No convincing evidence for bowel obstruction. Patient is status post partial left colectomy with reanastomosis. There is air and fluid distension of the entire colon which is similar compared to prior. There is no dilated small bowel or bowel wall thickening at this time. 2. Small foci of posterior lung consolidation could be due to atelectasis or mild aspiration. Electronically Signed   By: Donavan Foil M.D.   On: 02/07/2022 22:32   DG Chest Portable 1 View  Result Date: 02/07/2022 CLINICAL DATA:  Vomiting and chest pain with chills. EXAM: PORTABLE CHEST 1  VIEW COMPARISON:  AP Lat 01/24/2022 FINDINGS: The heart size and mediastinal contours are within normal limits. Both lungs are somewhat hypoinflated but generally clear. The visualized skeletal structures are unremarkable. Left upper abdominal surgical clips are again noted. IMPRESSION: No active disease.  Stable hypoinflated chest. Electronically Signed   By: Telford Nab M.D.   On: 02/07/2022 21:37   DG Chest 2 View  Result Date: 01/24/2022 CLINICAL DATA:  Dyspnea. EXAM: CHEST - 2 VIEW COMPARISON:  Chest from abdominal radiographs 01/03/2022 FINDINGS: Low lung volumes.The cardiomediastinal contours are normal. Minor bibasilar atelectasis. Pulmonary vasculature is normal. No consolidation, pleural effusion, or pneumothorax. No acute osseous abnormalities are seen. IMPRESSION: Low lung volumes with minor bibasilar atelectasis. Electronically Signed   By: Keith Rake M.D.   On: 01/24/2022 18:33     Subjective:   Discharge Exam: Vitals:   02/13/22 2117 02/14/22 0423  BP: 117/75 120/77  Pulse: 70 65  Resp: 20 20  Temp:  97.6 F (36.4 C)  SpO2: 100% 100%   Vitals:   02/13/22 1313 02/13/22 2117 02/14/22 0423 02/14/22 0500  BP: 119/86 117/75 120/77   Pulse: 76 70 65   Resp: '18 20 20   '$ Temp:   97.6 F (36.4 C)   TempSrc:   Oral   SpO2: 100% 100% 100%   Weight:    62.3 kg  Height:        Physical Exam: GEN: NAD, alert, chronically ill in appearance HEENT: NCAT, PERRL, EOMI, sclera clear, MMM PULM: CTAB w/o wheezes/crackles, normal respiratory effort, on room air CV: RRR w/o M/G/R GI: abd soft, NT, mild distention, NABS, no R/G/M MSK: no peripheral edema, muscle strength globally intact 5/5 bilateral upper/lower extremities NEURO: CN II-XII intact, no focal deficits, sensation to light touch intact PSYCH: Depressed mood, flat affect Integumentary: dry/intact, no rashes or wounds    The results of significant diagnostics from this hospitalization (including imaging,  microbiology, ancillary and laboratory) are listed below for reference.     Microbiology: Recent Results (from the past 240 hour(s))  Resp Panel by RT-PCR (Flu A&B, Covid) Anterior Nasal Swab     Status: Abnormal   Collection Time: 02/07/22  8:24 PM   Specimen: Anterior Nasal Swab  Result Value Ref Range Status   SARS Coronavirus 2 by RT PCR POSITIVE (A) NEGATIVE Final    Comment: (NOTE) SARS-CoV-2 target nucleic acids are DETECTED.  The SARS-CoV-2 RNA is generally detectable in upper respiratory specimens during the acute phase of infection. Positive results are indicative of the presence of the identified virus, but do not rule out bacterial infection or co-infection with other pathogens not detected by the test. Clinical correlation with patient history and other diagnostic information is necessary to determine patient infection status. The expected result is Negative.  Fact Sheet for Patients: EntrepreneurPulse.com.au  Fact Sheet for Healthcare Providers: IncredibleEmployment.be  This test is not yet approved or cleared by the [place] FDA and  has been authorized for detection and/or diagnosis of SARS-CoV-2 by FDA under an Emergency Use Authorization (EUA).  This EUA will remain in effect (meaning this test can be used) for the duration of  the COVID-19 declaration under Section 564(b)(1) of the A ct, 21 U.S.C. section 360bbb-3(b)(1), unless the authorization is terminated or revoked sooner.     Influenza A by PCR NEGATIVE NEGATIVE Final   Influenza B by PCR NEGATIVE NEGATIVE Final    Comment: (NOTE) The Xpert Xpress SARS-CoV-2/FLU/RSV plus assay is intended as an aid in the diagnosis of influenza from Nasopharyngeal swab specimens and should not be used as a sole basis for treatment. Nasal washings and aspirates are unacceptable for Xpert Xpress SARS-CoV-2/FLU/RSV testing.  Fact Sheet for  Patients: EntrepreneurPulse.com.au  Fact Sheet for Healthcare Providers: IncredibleEmployment.be  This test is not yet approved or cleared by the [place] FDA and has been authorized for detection and/or diagnosis of SARS-CoV-2 by FDA under an Emergency Use Authorization (EUA). This EUA will remain in effect (meaning this test can be used) for the duration of the COVID-19 declaration under Section 564(b)(1) of the Act, 21 U.S.C. section 360bbb-3(b)(1), unless the authorization is terminated or revoked.  Performed at [facility], [place] [address] [place], [place] [address]   Blood culture (routine x 2)     Status: None   Collection Time: 02/07/22  8:39 PM   Specimen: BLOOD  Result Value Ref Range Status   Specimen Description   Final    BLOOD BLOOD RIGHT ARM Performed at [facility] [address], [place], [place] [address]    Special Requests   Final    BOTTLES DRAWN AEROBIC AND ANAEROBIC Blood Culture results may not be optimal due to an inadequate volume of blood received in culture bottles Performed at [facility] [address], [place], [place] [address]    Culture   Final    NO GROWTH 5 DAYS Performed at [facility], [place] [address], [place], [place] [address]    Report Status 02/12/2022 FINAL  Final  Blood culture (routine x 2)     Status: None   Collection Time: 02/07/22  8:49 PM   Specimen: BLOOD  Result Value Ref Range Status   Specimen Description   Final    BLOOD BLOOD LEFT ARM Performed at [facility] [address] [place], [place] [address]    Special Requests   Final    BOTTLES DRAWN AEROBIC AND ANAEROBIC Blood Culture results may not be optimal due to an inadequate volume of blood received in culture bottles Performed at [facility] [address], [place], [place] [address]    Culture   Final    NO  GROWTH 5 DAYS Performed at [facility], [place] [address] [place], [place] [address]    Report Status 02/12/2022 FINAL  Final     Labs: BNP (last 3 results) No results for input(s): "BNP" in the last 8760 hours. Basic Metabolic Panel: Recent Labs  Lab 02/08/22 0443 02/09/22 0405 02/10/22 0437 02/11/22 0405 02/12/22 0349 02/13/22 0741 02/14/22 0359  NA 140   < > 143 145 142 144 142  K 4.3   < > 4.6 4.3 4.1 4.4 3.9  CL 107   < >  112* 109 108 108 109  CO2 23   < > '25 27 26 28 25  '$ GLUCOSE 90   < > 94 99 88 91 143*  BUN 24*   < > '20 19 16 19 22  '$ CREATININE 1.87*   < > 1.30* 1.46* 1.37* 1.43* 1.13  CALCIUM 8.2*   < > 8.3* 8.8* 8.1* 8.8* 8.2*  MG 2.3  --   --   --   --  2.8* 2.6*  PHOS 5.0*  --   --   --   --   --   --    < > = values in this interval not displayed.   Liver Function Tests: Recent Labs  Lab 02/08/22 0443 02/09/22 0405 02/10/22 0437 02/11/22 0405 02/12/22 0349  AST 32 '26 18 17 '$ 13*  ALT '23 21 16 16 12  '$ ALKPHOS 89 89 78 81 69  BILITOT 0.3 0.5 0.4 0.6 0.3  PROT 6.5 6.8 6.2* 7.2 6.6  ALBUMIN 2.9* 2.9* 2.6* 3.2* 2.8*   Recent Labs  Lab 02/07/22 2049  LIPASE 36   No results for input(s): "AMMONIA" in the last 168 hours. CBC: Recent Labs  Lab 02/09/22 0405 02/10/22 0437 02/11/22 0405 02/12/22 0349 02/14/22 0359  WBC 6.5 4.6 8.7 7.3 9.3  NEUTROABS 4.6 2.9 6.3 4.7 6.4  HGB 12.5* 11.7* 12.4* 11.6* 12.1*  HCT 40.8 37.5* 39.6 37.8* 37.4*  MCV 95.1 94.2 94.3 94.3 91.7  PLT 202 187 216 208 290   Cardiac Enzymes: No results for input(s): "CKTOTAL", "CKMB", "CKMBINDEX", "TROPONINI" in the last 168 hours. BNP: Invalid input(s): "POCBNP" CBG: No results for input(s): "GLUCAP" in the last 168 hours. D-Dimer Recent Labs    02/12/22 0349  DDIMER 0.34   Hgb A1c No results for input(s): "HGBA1C" in the last 72 hours. Lipid Profile No results for input(s): "CHOL", "HDL", "LDLCALC", "TRIG", "CHOLHDL", "LDLDIRECT" in the last 72 hours. Thyroid  function studies No results for input(s): "TSH", "T4TOTAL", "T3FREE", "THYROIDAB" in the last 72 hours.  Invalid input(s): "FREET3" Anemia work up Recent Labs    02/12/22 0349  FERRITIN 33   Urinalysis    Component Value Date/Time   COLORURINE YELLOW 02/08/2022 0500   APPEARANCEUR CLEAR 02/08/2022 0500   LABSPEC 1.012 02/08/2022 0500   PHURINE 6.0 02/08/2022 0500   GLUCOSEU NEGATIVE 02/08/2022 0500   HGBUR NEGATIVE 02/08/2022 0500   BILIRUBINUR NEGATIVE 02/08/2022 0500   KETONESUR NEGATIVE 02/08/2022 0500   PROTEINUR NEGATIVE 02/08/2022 0500   NITRITE NEGATIVE 02/08/2022 0500   LEUKOCYTESUR NEGATIVE 02/08/2022 0500   Sepsis Labs Recent Labs  Lab 02/10/22 0437 02/11/22 0405 02/12/22 0349 02/14/22 0359  WBC 4.6 8.7 7.3 9.3   Microbiology Recent Results (from the past 240 hour(s))  Resp Panel by RT-PCR (Flu A&B, Covid) Anterior Nasal Swab     Status: Abnormal   Collection Time: 02/07/22  8:24 PM   Specimen: Anterior Nasal Swab  Result Value Ref Range Status   SARS Coronavirus 2 by RT PCR POSITIVE (A) NEGATIVE Final    Comment: (NOTE) SARS-CoV-2 target nucleic acids are DETECTED.  The SARS-CoV-2 RNA is generally detectable in upper respiratory specimens during the acute phase of infection. Positive results are indicative of the presence of the identified virus, but do not rule out bacterial infection or co-infection with other pathogens not detected by the test. Clinical correlation with patient history and other diagnostic information is necessary to determine patient infection status. The expected result is Negative.  Fact Sheet for  Patients: EntrepreneurPulse.com.au  Fact Sheet for Healthcare Providers: IncredibleEmployment.be  This test is not yet approved or cleared by the [place] FDA and  has been authorized for detection and/or diagnosis of SARS-CoV-2 by FDA under an Emergency Use Authorization (EUA).  This EUA  will remain in effect (meaning this test can be used) for the duration of  the COVID-19 declaration under Section 564(b)(1) of the A ct, 21 U.S.C. section 360bbb-3(b)(1), unless the authorization is terminated or revoked sooner.     Influenza A by PCR NEGATIVE NEGATIVE Final   Influenza B by PCR NEGATIVE NEGATIVE Final    Comment: (NOTE) The Xpert Xpress SARS-CoV-2/FLU/RSV plus assay is intended as an aid in the diagnosis of influenza from Nasopharyngeal swab specimens and should not be used as a sole basis for treatment. Nasal washings and aspirates are unacceptable for Xpert Xpress SARS-CoV-2/FLU/RSV testing.  Fact Sheet for Patients: EntrepreneurPulse.com.au  Fact Sheet for Healthcare Providers: IncredibleEmployment.be  This test is not yet approved or cleared by the [place] FDA and has been authorized for detection and/or diagnosis of SARS-CoV-2 by FDA under an Emergency Use Authorization (EUA). This EUA will remain in effect (meaning this test can be used) for the duration of the COVID-19 declaration under Section 564(b)(1) of the Act, 21 U.S.C. section 360bbb-3(b)(1), unless the authorization is terminated or revoked.  Performed at [facility], [place] [address] [place], [place] [address]   Blood culture (routine x 2)     Status: None   Collection Time: 02/07/22  8:39 PM   Specimen: BLOOD  Result Value Ref Range Status   Specimen Description   Final    BLOOD BLOOD RIGHT ARM Performed at [facility] [address] [place], [place] [address]    Special Requests   Final    BOTTLES DRAWN AEROBIC AND ANAEROBIC Blood Culture results may not be optimal due to an inadequate volume of blood received in culture bottles Performed at [facility] [address] [place], [place] [address]    Culture   Final    NO GROWTH 5 DAYS Performed at [facility], [place] [address] [place], [place] [address]    Report Status 02/12/2022 FINAL  Final  Blood culture (routine x 2)     Status: None   Collection Time: 02/07/22  8:49 PM   Specimen: BLOOD  Result Value Ref Range Status   Specimen Description   Final    BLOOD BLOOD LEFT ARM Performed at [facility] [address], [place], [place] [address]    Special Requests   Final    BOTTLES DRAWN AEROBIC AND ANAEROBIC Blood Culture results may not be optimal due to an inadequate volume of blood received in culture bottles Performed at [facility] [address] [place], [place] [address]    Culture   Final    NO GROWTH 5 DAYS Performed at [facility], [place] [address] [place], [place] [address]    Report Status 02/12/2022 FINAL  Final     Time coordinating discharge: Over 30 minutes  SIGNED:   Margerite Impastato J [place] DO  [facility] 02/14/2022, 10:17 AM

## 2022-08-15 ENCOUNTER — Emergency Department (HOSPITAL_COMMUNITY)
Admission: EM | Admit: 2022-08-15 | Discharge: 2022-08-15 | Disposition: A | Discharge status: Home / Self Care | Payer: Medicaid Other | Attending: Emergency Medicine | Admitting: Emergency Medicine

## 2022-08-15 ENCOUNTER — Emergency Department (HOSPITAL_COMMUNITY): Payer: Medicaid Other

## 2022-08-15 ENCOUNTER — Encounter (HOSPITAL_COMMUNITY): Payer: Self-pay

## 2022-08-15 ENCOUNTER — Other Ambulatory Visit: Payer: Self-pay

## 2022-08-15 DIAGNOSIS — R030 Elevated blood-pressure reading, without diagnosis of hypertension: Secondary | ICD-10-CM | POA: Insufficient documentation

## 2022-08-15 LAB — BASIC METABOLIC PANEL
Anion gap: 10 (ref 5–15)
BUN: 19 mg/dL (ref 8–23)
CO2: 23 mmol/L (ref 22–32)
Calcium: 8.9 mg/dL (ref 8.9–10.3)
Chloride: 103 mmol/L (ref 98–111)
Creatinine, Ser: 1.89 mg/dL — ABNORMAL HIGH (ref 0.61–1.24)
GFR, Estimated: 39 mL/min — ABNORMAL LOW (ref >60)
Glucose, Bld: 89 mg/dL (ref 70–99)
Potassium: 4.4 mmol/L (ref 3.5–5.1)
Sodium: 136 mmol/L (ref 135–145)

## 2022-08-15 LAB — CBC WITH DIFFERENTIAL/PLATELET
Abs Immature Granulocytes: 0.01 10*3/uL (ref 0.00–0.07)
Basophils Absolute: 0 10*3/uL (ref 0.0–0.1)
Basophils Relative: 1 %
Eosinophils Absolute: 0.2 10*3/uL (ref 0.0–0.5)
Eosinophils Relative: 4 %
HCT: 40.2 % (ref 39.0–52.0)
Hemoglobin: 12.9 g/dL — ABNORMAL LOW (ref 13.0–17.0)
Immature Granulocytes: 0 %
Lymphocytes Relative: 32 %
Lymphs Abs: 2.1 10*3/uL (ref 0.7–4.0)
MCH: 28.5 pg (ref 26.0–34.0)
MCHC: 32.1 g/dL (ref 30.0–36.0)
MCV: 88.9 fL (ref 80.0–100.0)
Monocytes Absolute: 0.7 10*3/uL (ref 0.1–1.0)
Monocytes Relative: 11 %
Neutro Abs: 3.5 10*3/uL (ref 1.7–7.7)
Neutrophils Relative %: 52 %
Platelets: 291 10*3/uL (ref 150–400)
RBC: 4.52 MIL/uL (ref 4.22–5.81)
RDW: 15.3 % (ref 11.5–15.5)
WBC: 6.5 10*3/uL (ref 4.0–10.5)
nRBC: 0 % (ref 0.0–0.2)

## 2022-08-15 MED ORDER — SODIUM CHLORIDE 0.9 % IV BOLUS
500.0000 mL | Freq: Once | INTRAVENOUS | Status: AC
  Administered 2022-08-15: 500 mL via INTRAVENOUS

## 2022-08-15 NOTE — Discharge Instructions (Signed)
It was a pleasure taking care of you today!  Your labs didn't show any concerning emergent findings tonight. Maintain follow up with your care team regarding todays ED visit. Return to the ED if you are experiencing increasing/worsening symptoms.

## 2022-08-15 NOTE — ED Notes (Signed)
Pt given a urinal for UA, group home worker to assist pt, pt also given a cup of water

## 2022-08-15 NOTE — ED Triage Notes (Signed)
Arrives EMS from group home home accompanied by staff member. Staff checked BP and notified it was elevated (sbp 240). Then rechecked q1hr.   Has preexisting tremors. Home requested evaluation.   EMS was able to get a palpated bp of 140 but unable to determine accuracy due to tremors.

## 2022-08-15 NOTE — ED Provider Notes (Signed)
[place] EMERGENCY DEPARTMENT AT [facility] Provider Note   CSN: [id number] Arrival date & time: 08/15/22  1931     History  No chief complaint on file.   Nicholas Oconnell is a 62 y.o. male who presents to the emergency department with concerns for elevated blood pressure onset prior to arrival.  Caregiver at bedside from [facility] who notes that during routine vital sign check, patient had elevated systolic blood pressure in the 240s.  Patient was asymptomatic at that time.  Blood pressure obtained by EMS notes systolic blood pressure in the 140s.  Caregiver notes that patient is at his baseline.  Patient denies chest pain, shortness of breath, abdominal pain, nausea, vomiting, headache, vision changes, urinary symptoms.    The history is provided by the patient. No language interpreter was used.       Home Medications Prior to Admission medications   Medication Sig Start Date End Date Taking? Authorizing Provider  aMILoride (MIDAMOR) 5 MG tablet Take 5 mg by mouth daily.    [provider]  divalproex (DEPAKOTE ER) 500 MG 24 hr tablet Take 2 tablets (1,000 mg total) by mouth at bedtime. 04/28/21   Isla Pence, MD  Ensure (ENSURE) Take 1 Can by mouth 2 (two) times daily as needed (if 50% of meal is not consumed).    [provider]  haloperidol (HALDOL) 20 MG tablet Take 20 mg by mouth 3 (three) times daily.    [provider]  lithium carbonate 150 MG capsule Take 150-300 mg by mouth See admin instructions. Take 150 mg by mouth in the morning and 300 mg at bedtime    [provider]  Melatonin 10 MG CAPS Take 10 mg by mouth at bedtime.    [provider]  OVER THE COUNTER MEDICATION Place 1 enema rectally See admin instructions. Tap water enema- Place 1 enema rectally once a day as needed for constipation    [provider]  pantoprazole (PROTONIX) 40 MG tablet Take 1 tablet (40 mg total) by mouth in the morning and  at bedtime. 02/14/22 05/15/22  [place] Eric J, DO  patiromer (VELTASSA) 8.4 g packet Take 8.4 g by mouth daily.    [provider]  propranolol (INDERAL) 10 MG tablet Take 10 mg by mouth 3 (three) times daily.    [provider]  QUEtiapine (SEROQUEL) 400 MG tablet Take 400 mg by mouth 2 (two) times daily.    [provider]  simethicone (MYLICON) 0000000 MG chewable tablet Chew 125 mg by mouth every 6 (six) hours as needed for flatulence.    [provider]  sodium phosphate (FLEET) 7-19 GM/118ML ENEM Place 133 mLs (1 enema total) rectally daily as needed for moderate constipation (constipation). 02/14/22   [place] Eric J, DO  valbenazine [facility]) 40 MG capsule Take 1 capsule (40 mg total) by mouth daily. Patient not taking: Reported on 02/09/2022 01/16/22   Suella Broad, FNP  Vitamin D, Ergocalciferol, (DRISDOL) 1.25 MG (50000 UNIT) CAPS capsule Take 1 capsule (50,000 Units total) by mouth every 7 (seven) days. 01/11/22   Caren Griffins, MD  zolpidem (AMBIEN) 10 MG tablet Take 10 mg by mouth at bedtime. 06/21/20   [provider]      Allergies    Benzodiazepines and Clozapine    Review of Systems   Review of Systems  All other systems reviewed and are negative.   Physical Exam Updated Vital Signs BP (!) 142/91  Pulse 80   Temp (!) 97.5 F (36.4 C) (Oral)   Resp 15   SpO2 100%  Physical Exam Vitals and nursing note reviewed.  Constitutional:      General: He is not in acute distress.    Appearance: He is not diaphoretic.  HENT:     Head: Normocephalic and atraumatic.     Mouth/Throat:     Pharynx: No oropharyngeal exudate.  Eyes:     General: No scleral icterus.    Conjunctiva/sclera: Conjunctivae normal.  Cardiovascular:     Rate and Rhythm: Normal rate and regular rhythm.     Pulses: Normal pulses.     Heart sounds: Normal heart sounds.  Pulmonary:     Effort: Pulmonary effort is normal. No respiratory distress.      Breath sounds: Normal breath sounds. No wheezing.  Abdominal:     General: Bowel sounds are normal.     Palpations: Abdomen is soft. There is no mass.     Tenderness: There is no abdominal tenderness. There is no guarding or rebound.  Musculoskeletal:        General: Normal range of motion.     Cervical back: Normal range of motion and neck supple.     Comments: Grip strength 5/5 bilaterally.  Strength intact to bilateral lower extremities.  Skin:    General: Skin is warm and dry.  Neurological:     Mental Status: He is alert.  Psychiatric:        Behavior: Behavior normal.     ED Results / Procedures / Treatments   Labs (all labs ordered are listed, but only abnormal results are displayed) Labs Reviewed  BASIC METABOLIC PANEL - Abnormal; Notable for the following components:      Result Value   Creatinine, Ser 1.89 (*)    GFR, Estimated 39 (*)    All other components within normal limits  CBC WITH DIFFERENTIAL/PLATELET - Abnormal; Notable for the following components:   Hemoglobin 12.9 (*)    All other components within normal limits  URINALYSIS, ROUTINE W REFLEX MICROSCOPIC    EKG EKG Interpretation  Date/Time:  Wednesday August 15 2022 21:20:53 EDT Ventricular Rate:  67 PR Interval:    QRS Duration: 87 QT Interval:  425 QTC Calculation: 449 R Axis:   64 Text Interpretation: NSR with motion artifact T wave inversion lateral leads Similar to prior EKG Confirmed by Georgina Snell [phone]) on 08/15/2022 10:04:39 PM  Radiology DG Chest 2 View  Result Date: 08/15/2022 CLINICAL DATA:  Elevated blood pressure. EXAM: CHEST - 2 VIEW COMPARISON:  02/07/2022. FINDINGS: The heart size and mediastinal contours are within normal limits. No consolidation, effusion, or pneumothorax. Surgical clips are present in the left upper quadrant. No acute osseous abnormality. IMPRESSION: No active cardiopulmonary disease. Electronically Signed   By: Brett Fairy M.D.   On: 08/15/2022 21:29     Procedures Procedures    Medications Ordered in ED Medications  sodium chloride 0.9 % bolus 500 mL (0 mLs Intravenous Stopped 08/15/22 2246)    ED Course/ Medical Decision Making/ A&P                              Medical Decision Making Amount and/or Complexity of Data Reviewed Labs: ordered. Radiology: ordered.   Pt presents with concerns for elevated blood pressure onset PTA during routine vital sign checks. Vital signs, pt afebrile. On exam, pt with no acute cardiovascular, respiratory,  abdominal exam findings. Differential diagnosis includes elevated blood pressure, CVA, TIA.  Additional history obtained:  Additional history obtained from Caregiver  Labs:  I ordered, and personally interpreted labs.  The pertinent results include:   BMP with slightly elevated creatinine at 1.89 otherwise unremarkable. CBC without leukocytosis  Imaging: I ordered imaging studies including Chest x-ray I independently visualized and interpreted imaging which showed: no acute findings I agree with the radiologist interpretation  Medications:  I ordered medication including IVF for symptom management Reevaluation of the patient after these medicines and interventions, I reevaluated the patient and found that they have improved I have reviewed the patients home medicines and have made adjustments as needed    Disposition: Presentation suspicious for elevated blood pressure. Doubt CVA or TIA at this time. Pt asymptomatic and compliant with meds through facility. No emergent lab findings tonight. Pt at baseline per caregiver. After consideration of the diagnostic results and the patients response to treatment, I feel that the patient would benefit from Discharge home. Supportive care measures and strict return precautions discussed with patient at bedside. Pt acknowledges and verbalizes understanding. Pt appears safe for discharge. Follow up as indicated in discharge paperwork.    This  chart was dictated using voice recognition software, Dragon. Despite the best efforts of this provider to proofread and correct errors, errors may still occur which can change documentation meaning.   Final Clinical Impression(s) / ED Diagnoses Final diagnoses:  Elevated blood pressure reading    Rx / DC Orders ED Discharge Orders     None         Aarish Rockers A, PA-C 08/15/22 2313    Elgie Congo, MD 08/16/22 [phone]

## 2022-11-08 ENCOUNTER — Other Ambulatory Visit: Payer: Self-pay

## 2022-11-08 ENCOUNTER — Encounter (HOSPITAL_COMMUNITY): Payer: Self-pay | Admitting: Emergency Medicine

## 2022-11-08 ENCOUNTER — Emergency Department (HOSPITAL_COMMUNITY): Payer: Medicaid Other

## 2022-11-08 ENCOUNTER — Emergency Department (HOSPITAL_COMMUNITY)
Admission: EM | Admit: 2022-11-08 | Discharge: 2022-11-09 | Disposition: A | Discharge status: Home / Self Care | Payer: Medicaid Other | Attending: Emergency Medicine | Admitting: Emergency Medicine

## 2022-11-08 DIAGNOSIS — Z79899 Other long term (current) drug therapy: Secondary | ICD-10-CM | POA: Insufficient documentation

## 2022-11-08 DIAGNOSIS — R350 Frequency of micturition: Secondary | ICD-10-CM | POA: Insufficient documentation

## 2022-11-08 DIAGNOSIS — R944 Abnormal results of kidney function studies: Secondary | ICD-10-CM | POA: Diagnosis not present

## 2022-11-08 DIAGNOSIS — R251 Tremor, unspecified: Secondary | ICD-10-CM | POA: Insufficient documentation

## 2022-11-08 DIAGNOSIS — R2681 Unsteadiness on feet: Secondary | ICD-10-CM | POA: Insufficient documentation

## 2022-11-08 LAB — COMPREHENSIVE METABOLIC PANEL
ALT: 18 U/L (ref 0–44)
AST: 20 U/L (ref 15–41)
Albumin: 4.1 g/dL (ref 3.5–5.0)
Alkaline Phosphatase: 170 U/L — ABNORMAL HIGH (ref 38–126)
Anion gap: 10 (ref 5–15)
BUN: 27 mg/dL — ABNORMAL HIGH (ref 8–23)
CO2: 21 mmol/L — ABNORMAL LOW (ref 22–32)
Calcium: 9.3 mg/dL (ref 8.9–10.3)
Chloride: 106 mmol/L (ref 98–111)
Creatinine, Ser: 1.79 mg/dL — ABNORMAL HIGH (ref 0.61–1.24)
GFR, Estimated: 42 mL/min — ABNORMAL LOW (ref >60)
Glucose, Bld: 97 mg/dL (ref 70–99)
Potassium: 4.4 mmol/L (ref 3.5–5.1)
Sodium: 137 mmol/L (ref 135–145)
Total Bilirubin: 0.6 mg/dL (ref 0.3–1.2)
Total Protein: 8.2 g/dL — ABNORMAL HIGH (ref 6.5–8.1)

## 2022-11-08 LAB — CBC WITH DIFFERENTIAL/PLATELET
Abs Immature Granulocytes: 0.02 10*3/uL (ref 0.00–0.07)
Basophils Absolute: 0 10*3/uL (ref 0.0–0.1)
Basophils Relative: 1 %
Eosinophils Absolute: 0.2 10*3/uL (ref 0.0–0.5)
Eosinophils Relative: 3 %
HCT: 40.4 % (ref 39.0–52.0)
Hemoglobin: 13.2 g/dL (ref 13.0–17.0)
Immature Granulocytes: 0 %
Lymphocytes Relative: 27 %
Lymphs Abs: 1.7 10*3/uL (ref 0.7–4.0)
MCH: 29.6 pg (ref 26.0–34.0)
MCHC: 32.7 g/dL (ref 30.0–36.0)
MCV: 90.6 fL (ref 80.0–100.0)
Monocytes Absolute: 0.8 10*3/uL (ref 0.1–1.0)
Monocytes Relative: 12 %
Neutro Abs: 3.7 10*3/uL (ref 1.7–7.7)
Neutrophils Relative %: 57 %
Platelets: 265 10*3/uL (ref 150–400)
RBC: 4.46 MIL/uL (ref 4.22–5.81)
RDW: 14.8 % (ref 11.5–15.5)
WBC: 6.4 10*3/uL (ref 4.0–10.5)
nRBC: 0 % (ref 0.0–0.2)

## 2022-11-08 LAB — MAGNESIUM: Magnesium: 2.1 mg/dL (ref 1.7–2.4)

## 2022-11-08 LAB — LITHIUM LEVEL: Lithium Lvl: 1.15 mmol/L (ref 0.60–1.20)

## 2022-11-08 LAB — VALPROIC ACID LEVEL: Valproic Acid Lvl: <10 ug/mL — ABNORMAL LOW (ref 50.0–100.0)

## 2022-11-08 MED ORDER — DIVALPROEX SODIUM 500 MG PO DR TAB
500.0000 mg | DELAYED_RELEASE_TABLET | Freq: Once | ORAL | Status: AC
  Administered 2022-11-09: 500 mg via ORAL
  Filled 2022-11-08: qty 1

## 2022-11-08 MED ORDER — AMILORIDE HCL 5 MG PO TABS
5.0000 mg | ORAL_TABLET | Freq: Every day | ORAL | Status: AC
  Administered 2022-11-09: 5 mg via ORAL
  Filled 2022-11-08: qty 1

## 2022-11-08 MED ORDER — AMLODIPINE BESYLATE 5 MG PO TABS
5.0000 mg | ORAL_TABLET | Freq: Once | ORAL | Status: AC
  Administered 2022-11-08: 5 mg via ORAL
  Filled 2022-11-08: qty 1

## 2022-11-08 MED ORDER — PANTOPRAZOLE SODIUM 40 MG PO TBEC
40.0000 mg | DELAYED_RELEASE_TABLET | Freq: Once | ORAL | Status: AC
  Administered 2022-11-08: 40 mg via ORAL
  Filled 2022-11-08: qty 1

## 2022-11-08 MED ORDER — QUETIAPINE FUMARATE 300 MG PO TABS
400.0000 mg | ORAL_TABLET | Freq: Two times a day (BID) | ORAL | Status: DC
  Administered 2022-11-08: 400 mg via ORAL
  Filled 2022-11-08: qty 1

## 2022-11-08 MED ORDER — HALOPERIDOL 5 MG PO TABS
20.0000 mg | ORAL_TABLET | Freq: Once | ORAL | Status: AC
  Administered 2022-11-08: 20 mg via ORAL
  Filled 2022-11-08: qty 4

## 2022-11-08 MED ORDER — POLYETHYLENE GLYCOL 3350 17 G PO PACK
17.0000 g | PACK | Freq: Every day | ORAL | Status: DC
  Administered 2022-11-08: 17 g via ORAL
  Filled 2022-11-08: qty 1

## 2022-11-08 MED ORDER — PROPRANOLOL HCL 20 MG PO TABS
10.0000 mg | ORAL_TABLET | Freq: Three times a day (TID) | ORAL | Status: DC
  Administered 2022-11-08: 10 mg via ORAL
  Filled 2022-11-08: qty 1

## 2022-11-08 MED ORDER — ZOLPIDEM TARTRATE 5 MG PO TABS
10.0000 mg | ORAL_TABLET | Freq: Once | ORAL | Status: AC
  Administered 2022-11-08: 10 mg via ORAL
  Filled 2022-11-08: qty 2

## 2022-11-08 MED ORDER — SODIUM CHLORIDE 0.9 % IV BOLUS
1000.0000 mL | Freq: Once | INTRAVENOUS | Status: AC
  Administered 2022-11-08: 1000 mL via INTRAVENOUS

## 2022-11-08 NOTE — ED Provider Notes (Incomplete)
[place] EMERGENCY DEPARTMENT AT [facility] Provider Note   CSN: [id number] Arrival date & time: 11/08/22  1824     History {Add pertinent medical, surgical, social history, OB history to HPI:1} Chief Complaint  Patient presents with  . Tremors    Nicholas Oconnell is a 62 y.o. male with history of intellectual disability, tardive dyskinesia, schizoaffective disorder presenting to the ED with tremor and unsteady gait. History is from caregiver at bedside as patient unable to communicate coherently. The patient ran out of his Depakote and has not been taking it for the past 5 days. Over the past 5 days his baseline tremor has worsened. Today the caregiver also noted that the patient was not able to ambulate with his walker and was unsteady. Normally the patient is able to ambulate with a walker unassisted. Caregiver denies any falls or patient hitting his head. Caregiver reports no other changes in the patient.   Of note, patient is on multiple tremor inducing medications including Depakote, Haldol, high-dose Seroquel, lithium.   HPI     Home Medications Prior to Admission medications   Medication Sig Start Date End Date Taking? Authorizing Provider  aMILoride (MIDAMOR) 5 MG tablet Take 5 mg by mouth daily.    [provider]  divalproex (DEPAKOTE ER) 500 MG 24 hr tablet Take 2 tablets (1,000 mg total) by mouth at bedtime. 04/28/21   Jacalyn Lefevre, MD  Ensure (ENSURE) Take 1 Can by mouth 2 (two) times daily as needed (if 50% of meal is not consumed).    [provider]  haloperidol (HALDOL) 20 MG tablet Take 20 mg by mouth 3 (three) times daily.    [provider]  lithium carbonate 150 MG capsule Take 150-300 mg by mouth See admin instructions. Take 150 mg by mouth in the morning and 300 mg at bedtime    [provider]  Melatonin 10 MG CAPS Take 10 mg by mouth at bedtime.    [provider]  OVER THE COUNTER MEDICATION Place 1  enema rectally See admin instructions. Tap water enema- Place 1 enema rectally once a day as needed for constipation    [provider]  pantoprazole (PROTONIX) 40 MG tablet Take 1 tablet (40 mg total) by mouth in the morning and at bedtime. 02/14/22 05/15/22  [place], Eric J, DO  patiromer (VELTASSA) 8.4 g packet Take 8.4 g by mouth daily.    [provider]  propranolol (INDERAL) 10 MG tablet Take 10 mg by mouth 3 (three) times daily.    [provider]  QUEtiapine (SEROQUEL) 400 MG tablet Take 400 mg by mouth 2 (two) times daily.    [provider]  simethicone (MYLICON) 125 MG chewable tablet Chew 125 mg by mouth every 6 (six) hours as needed for flatulence.    [provider]  sodium phosphate (FLEET) 7-19 GM/118ML ENEM Place 133 mLs (1 enema total) rectally daily as needed for moderate constipation (constipation). 02/14/22   [place], Eric J, DO  valbenazine [facility]) 40 MG capsule Take 1 capsule (40 mg total) by mouth daily. Patient not taking: Reported on 02/09/2022 01/16/22   Maryagnes Amos, FNP  Vitamin D, Ergocalciferol, (DRISDOL) 1.25 MG (50000 UNIT) CAPS capsule Take 1 capsule (50,000 Units total) by mouth every 7 (seven) days. 01/11/22   Leatha Gilding, MD  zolpidem (AMBIEN) 10 MG tablet Take 10 mg by mouth at bedtime. 06/21/20   [provider]      Allergies  Benzodiazepines and Clozapine    Review of Systems   Review of Systems  Reason unable to perform ROS: Intellectual Disability.    Physical Exam Updated Vital Signs BP (!) 133/105   Pulse 79   Temp 98.3 F (36.8 C)   Resp 19   Ht 5\' 8"  (1.727 m)   Wt 62.1 kg   SpO2 100%   BMI 20.83 kg/m  Physical Exam Vitals and nursing note reviewed.  Constitutional:      General: He is not in acute distress. Eyes:     Extraocular Movements: Extraocular movements intact.     Pupils: Pupils are equal, round, and reactive to light.  Cardiovascular:     Rate and  Rhythm: Normal rate and regular rhythm.  Pulmonary:     Effort: Pulmonary effort is normal.     Breath sounds: Normal breath sounds.  Abdominal:     General: Abdomen is flat.     Palpations: Abdomen is soft.  Neurological:     Mental Status: He is alert.     Comments: Full body resting tremor  Able to ambulate with walker with assistance     ED Results / Procedures / Treatments   Labs (all labs ordered are listed, but only abnormal results are displayed) Labs Reviewed  LITHIUM LEVEL  CBC WITH DIFFERENTIAL/PLATELET  COMPREHENSIVE METABOLIC PANEL    EKG None  Radiology No results found.  Procedures Procedures  {Document cardiac monitor, telemetry assessment procedure when appropriate:1}  Medications Ordered in ED Medications - No data to display  ED Course/ Medical Decision Making/ A&P   {   Click here for ABCD2, HEART and other calculatorsREFRESH Note before signing :1}                          Medical Decision Making Amount and/or Complexity of Data Reviewed Labs: ordered. Radiology: ordered.  Risk Prescription drug management.  This patient presents to the ED for concern of tremor and unsteady gait differential diagnosis includes medication side effects, lithium toxicity, elevated Mg, less likely brain bleed contributing to unsteady gait.     Additional history obtained:  Additional history obtained from caregiver at bedside External records from outside source obtained and reviewed including note from neurologist visit in March 2024   Lab Tests:  I Ordered, and personally interpreted labs.  The pertinent results include:   Lithium, magnesium within normal limits Valproic acid level below therapeutic threshold, as expected since patient has been without Depakote for 5 days  Cr 1.79, consistent with baseline    Imaging Studies ordered:  I ordered imaging studies including CT head w/o contrast  I independently visualized and interpreted imaging  which showed no concern for acute bleed or midline shift I agree with the radiologist interpretation    Problem List / ED Course:  Resting tremor  -Lithium levels, mg within normal limits -CBC within normal limits except for Cr which is elevated but consistent with baseline  -Suspect this exacerbation of his tremor is secondary to medication side effects, will need follow up with his neurologist    Unsteady Gait -CT head shows no concern for acute bleed or midline shift  -Patient able to ambulate on unit with walker and verbal cues. Has returned to baseline gait status per caregiver   8:30: Caregiver brought at to my attention patient has 8:00pm medications and does not have them with her. Patient home medications ordered and given   11:15 Caregiver feels  comfortable with plan to discharge. She understands patient needs a follow up appointment with his neurologist Dr. Ansel Bong.   12:01: Patient ambulated and caregiver states his ambulation has improved since earlier this evening. Patient to be discharged back to assisted living facility after IV fluids finish     Social Determinants of Health:  Lives at assisted living facility Intellectual disability    {Document critical care time when appropriate:1} {Document review of labs and clinical decision tools ie heart score, Chads2Vasc2 etc:1}  {Document your independent review of radiology images, and any outside records:1} {Document your discussion with family members, caretakers, and with consultants:1} {Document social determinants of health affecting pt's care:1} {Document your decision making why or why not admission, treatments were needed:1} Final Clinical Impression(s) / ED Diagnoses Final diagnoses:  None    Rx / DC Orders ED Discharge Orders     None

## 2022-11-08 NOTE — ED Provider Notes (Signed)
[facility] EMERGENCY DEPARTMENT AT [facility] Provider Note   CSN: [id number] Arrival date & time: 11/08/22  1824     History  Chief Complaint  Patient presents with   Tremors    Nicholas Oconnell is a 62 y.o. male with history of intellectual disability, tardive dyskinesia, schizoaffective disorder presenting to the ED with tremor and unsteady gait. History is from caregiver at bedside as patient unable to communicate coherently. The patient ran out of his Depakote and has not been taking it for the past 5 days. Over the past 5 days his baseline tremor has worsened. Today the caregiver also noted that the patient was not able to ambulate with his walker and was unsteady. Normally the patient is able to ambulate with a walker unassisted. Caregiver denies any falls or patient hitting his head. Caregiver reports no other changes in the patient.   Of note, patient is on multiple tremor inducing medications including Depakote, Haldol, high-dose Seroquel, lithium.   HPI     Home Medications Prior to Admission medications   Medication Sig Start Date End Date Taking? Authorizing Provider  aMILoride (MIDAMOR) 5 MG tablet Take 5 mg by mouth daily.    [provider]  divalproex (DEPAKOTE ER) 500 MG 24 hr tablet Take 2 tablets (1,000 mg total) by mouth at bedtime. 04/28/21   Jacalyn Lefevre, MD  Ensure (ENSURE) Take 1 Can by mouth 2 (two) times daily as needed (if 50% of meal is not consumed).    [provider]  haloperidol (HALDOL) 20 MG tablet Take 20 mg by mouth 3 (three) times daily.    [provider]  lithium carbonate 150 MG capsule Take 150-300 mg by mouth See admin instructions. Take 150 mg by mouth in the morning and 300 mg at bedtime    [provider]  Melatonin 10 MG CAPS Take 10 mg by mouth at bedtime.    [provider]  OVER THE COUNTER MEDICATION Place 1 enema rectally See admin instructions. Tap water enema- Place 1 enema  rectally once a day as needed for constipation    [provider]  pantoprazole (PROTONIX) 40 MG tablet Take 1 tablet (40 mg total) by mouth in the morning and at bedtime. 02/14/22 05/15/22  [place], Eric J, DO  patiromer (VELTASSA) 8.4 g packet Take 8.4 g by mouth daily.    [provider]  propranolol (INDERAL) 10 MG tablet Take 10 mg by mouth 3 (three) times daily.    [provider]  QUEtiapine (SEROQUEL) 400 MG tablet Take 400 mg by mouth 2 (two) times daily.    [provider]  simethicone (MYLICON) 125 MG chewable tablet Chew 125 mg by mouth every 6 (six) hours as needed for flatulence.    [provider]  sodium phosphate (FLEET) 7-19 GM/118ML ENEM Place 133 mLs (1 enema total) rectally daily as needed for moderate constipation (constipation). 02/14/22   [place], Eric J, DO  valbenazine [facility]) 40 MG capsule Take 1 capsule (40 mg total) by mouth daily. Patient not taking: Reported on 02/09/2022 01/16/22   Maryagnes Amos, FNP  Vitamin D, Ergocalciferol, (DRISDOL) 1.25 MG (50000 UNIT) CAPS capsule Take 1 capsule (50,000 Units total) by mouth every 7 (seven) days. 01/11/22   Leatha Gilding, MD  zolpidem (AMBIEN) 10 MG tablet Take 10 mg by mouth at bedtime. 06/21/20   [provider]      Allergies    Benzodiazepines and Clozapine  Review of Systems   Review of Systems  Reason unable to perform ROS: Intellectual Disability.    Physical Exam Updated Vital Signs BP (!) 133/105   Pulse 79   Temp 98.3 F (36.8 C)   Resp 19   Ht 5\' 8"  (1.727 m)   Wt 62.1 kg   SpO2 100%   BMI 20.83 kg/m  Physical Exam Vitals and nursing note reviewed.  Constitutional:      General: He is not in acute distress. Eyes:     Extraocular Movements: Extraocular movements intact.     Pupils: Pupils are equal, round, and reactive to light.  Cardiovascular:     Rate and Rhythm: Normal rate and regular rhythm.  Pulmonary:     Effort:  Pulmonary effort is normal.     Breath sounds: Normal breath sounds.  Abdominal:     General: Abdomen is flat.     Palpations: Abdomen is soft.  Neurological:     Mental Status: He is alert.     Comments: Full body resting tremor  Able to ambulate with walker with assistance     ED Results / Procedures / Treatments   Labs (all labs ordered are listed, but only abnormal results are displayed) Labs Reviewed  LITHIUM LEVEL  CBC WITH DIFFERENTIAL/PLATELET  COMPREHENSIVE METABOLIC PANEL    EKG None  Radiology No results found.  Procedures Procedures    Medications Ordered in ED Medications - No data to display  ED Course/ Medical Decision Making/ A&P                             Medical Decision Making Amount and/or Complexity of Data Reviewed Labs: ordered. Radiology: ordered.  Risk Prescription drug management.  This patient presents to the ED for concern of tremor and unsteady gait differential diagnosis includes medication side effects, lithium toxicity, elevated Mg, less likely brain bleed contributing to unsteady gait.     Additional history obtained:  Additional history obtained from caregiver at bedside External records from outside source obtained and reviewed including note from neurologist visit in March 2024   Lab Tests:  I Ordered, and personally interpreted labs.  The pertinent results include:   Lithium, magnesium within normal limits Valproic acid level below therapeutic threshold, as expected since patient has been without Depakote for 5 days  Cr 1.79, consistent with baseline    Imaging Studies ordered:  I ordered imaging studies including CT head w/o contrast  I independently visualized and interpreted imaging which showed no concern for acute bleed or midline shift I agree with the radiologist interpretation    Problem List / ED Course:  Resting tremor  -Lithium levels, mg within normal limits -CBC within normal limits except  for Cr which is elevated but consistent with baseline  -Suspect this exacerbation of his tremor is secondary to medication side effects, will need follow up with his neurologist    Unsteady Gait -CT head shows no concern for acute bleed or midline shift  -Patient able to ambulate on unit with walker and verbal cues. Has returned to baseline gait status per caregiver   8:30: Caregiver brought at to my attention patient has 8:00pm medications and does not have them with her. Patient home medications ordered and given   11:15 Caregiver feels comfortable with plan to discharge. She understands patient needs a follow up appointment with his neurologist Dr. Ansel Bong.   12:01: Patient ambulated and caregiver states  his ambulation has improved since earlier this evening. Patient to be discharged back to assisted living facility after IV fluids finish  12:07am: Nurse notified patient may be discharged after IV fluids finish     Social Determinants of Health:  Lives at assisted living facility Intellectual disability          Final Clinical Impression(s) / ED Diagnoses Final diagnoses:  None    Rx / DC Orders ED Discharge Orders     None         Arabella Merles, PA-C 11/09/22 0008    Lonell Grandchild, MD 11/15/22 1114

## 2022-11-08 NOTE — Discharge Instructions (Addendum)
Patient tremor most likely due to medication side effects. Follow up with your neurologist Dr. Hollice Espy should tremor not return to baseline within the next 48 hours.    Return to the ER should gait decline acutely or tremor acutely worsens or you experience any other concerning symptoms.

## 2022-11-08 NOTE — ED Notes (Signed)
Attempted to in and out cath pt, hit resistance and had the RN step in and they had the same difficulty. Was unable to obtain the urine at this time.

## 2022-11-08 NOTE — ED Notes (Signed)
Ambulated pt, pt is not at baseline or near baseline. Pt need lots of assistant both getting out of to walking, even with a walker. Pt was weak and extremely shaky.

## 2022-11-08 NOTE — ED Triage Notes (Signed)
Pt BIB [facility] from group home [facility]). Per staff, pt noted with new onset tremors and unsteady gait. Pt attempted to stand, after short struggle wobbled down to seated position.   BP 160/110 P 80 spO2 92% RA

## 2022-11-09 ENCOUNTER — Other Ambulatory Visit: Payer: Self-pay

## 2022-11-09 ENCOUNTER — Encounter (HOSPITAL_COMMUNITY): Payer: Self-pay

## 2022-11-09 ENCOUNTER — Emergency Department (HOSPITAL_COMMUNITY)
Admission: EM | Admit: 2022-11-09 | Discharge: 2022-11-10 | Disposition: A | Discharge status: Home / Self Care | Payer: Medicaid Other | Source: Home / Self Care | Attending: Emergency Medicine | Admitting: Emergency Medicine

## 2022-11-09 DIAGNOSIS — R251 Tremor, unspecified: Secondary | ICD-10-CM

## 2022-11-09 LAB — CBC
HCT: 38.9 % — ABNORMAL LOW (ref 39.0–52.0)
Hemoglobin: 12.5 g/dL — ABNORMAL LOW (ref 13.0–17.0)
MCH: 29.4 pg (ref 26.0–34.0)
MCHC: 32.1 g/dL (ref 30.0–36.0)
MCV: 91.5 fL (ref 80.0–100.0)
Platelets: 279 10*3/uL (ref 150–400)
RBC: 4.25 MIL/uL (ref 4.22–5.81)
RDW: 14.9 % (ref 11.5–15.5)
WBC: 6 10*3/uL (ref 4.0–10.5)
nRBC: 0 % (ref 0.0–0.2)

## 2022-11-09 LAB — BASIC METABOLIC PANEL
Anion gap: 9 (ref 5–15)
BUN: 24 mg/dL — ABNORMAL HIGH (ref 8–23)
CO2: 22 mmol/L (ref 22–32)
Calcium: 9 mg/dL (ref 8.9–10.3)
Chloride: 108 mmol/L (ref 98–111)
Creatinine, Ser: 1.78 mg/dL — ABNORMAL HIGH (ref 0.61–1.24)
GFR, Estimated: 42 mL/min — ABNORMAL LOW (ref >60)
Glucose, Bld: 113 mg/dL — ABNORMAL HIGH (ref 70–99)
Potassium: 4.6 mmol/L (ref 3.5–5.1)
Sodium: 139 mmol/L (ref 135–145)

## 2022-11-09 MED ORDER — LITHIUM CARBONATE 300 MG PO CAPS
300.0000 mg | ORAL_CAPSULE | Freq: Every day | ORAL | Status: DC
  Administered 2022-11-09: 300 mg via ORAL
  Filled 2022-11-09: qty 1

## 2022-11-09 MED ORDER — QUETIAPINE FUMARATE 300 MG PO TABS
400.0000 mg | ORAL_TABLET | Freq: Two times a day (BID) | ORAL | Status: DC
  Administered 2022-11-09: 400 mg via ORAL
  Filled 2022-11-09: qty 1

## 2022-11-09 MED ORDER — LITHIUM CARBONATE 150 MG PO CAPS
150.0000 mg | ORAL_CAPSULE | Freq: Every day | ORAL | Status: DC
  Filled 2022-11-09: qty 1

## 2022-11-09 MED ORDER — HALOPERIDOL 5 MG PO TABS
20.0000 mg | ORAL_TABLET | Freq: Every day | ORAL | Status: DC
  Administered 2022-11-09: 20 mg via ORAL
  Filled 2022-11-09: qty 4

## 2022-11-09 MED ORDER — PROPRANOLOL HCL 20 MG PO TABS
10.0000 mg | ORAL_TABLET | Freq: Three times a day (TID) | ORAL | Status: DC
  Administered 2022-11-09: 10 mg via ORAL
  Filled 2022-11-09: qty 1

## 2022-11-09 MED ORDER — AMILORIDE HCL 5 MG PO TABS
5.0000 mg | ORAL_TABLET | Freq: Every day | ORAL | Status: DC
  Administered 2022-11-09: 5 mg via ORAL
  Filled 2022-11-09 (×2): qty 1

## 2022-11-09 MED ORDER — SODIUM CHLORIDE 0.9 % IV SOLN: 1000.0000 mL | INTRAVENOUS | Status: DC

## 2022-11-09 MED ORDER — SODIUM CHLORIDE 0.9 % IV BOLUS (SEPSIS)
1000.0000 mL | Freq: Once | INTRAVENOUS | Status: AC
  Administered 2022-11-10: 1000 mL via INTRAVENOUS

## 2022-11-09 MED ORDER — LITHIUM CARBONATE 150 MG PO CAPS: 150.0000 mg | ORAL_CAPSULE | ORAL | Status: DC

## 2022-11-09 MED ORDER — PATIROMER SORBITEX CALCIUM 8.4 G PO PACK
8.4000 g | PACK | Freq: Every day | ORAL | Status: DC
  Filled 2022-11-09: qty 1

## 2022-11-09 MED ORDER — AMLODIPINE BESYLATE 5 MG PO TABS: 5.0000 mg | ORAL_TABLET | Freq: Every day | ORAL | Status: DC

## 2022-11-09 MED ORDER — DIVALPROEX SODIUM ER 500 MG PO TB24
1000.0000 mg | ORAL_TABLET | Freq: Every day | ORAL | Status: DC
  Administered 2022-11-09: 1000 mg via ORAL
  Filled 2022-11-09: qty 2

## 2022-11-09 NOTE — ED Triage Notes (Signed)
Pt bib EMS from a group home [address] [place], for urinary incontinence and inability to walk. Pt is normally ambulatory, pt is alert to person and place. Pt does have tremors but have been worsening, seen yesterday in the ED. Mobility issues for 1 wk

## 2022-11-09 NOTE — ED Provider Notes (Signed)
[facility] EMERGENCY DEPARTMENT AT [facility] Provider Note   CSN: [id number] Arrival date & time: 11/09/22  2031     History {Add pertinent medical, surgical, social history, OB history to HPI:1} Chief Complaint  Patient presents with   Urinary Frequency    Pt bib EMS from a group home [facility], for urinary incontinence and inability to walk. Pt is normally ambulatory, pt is alert to person and place. Pt does have tremors but have been worsening, seen yesterday in the ED. Mobility issues for 1 wk    Avion Pavis is a 62 y.o. male.   Urinary Frequency     Patient lives at a group home.  He presents to the ED for evaluation of worsening tremors.  Caregiver states that patient normally is on Depakote.  He has been out of it for several days.  His tremors have increased since that time.  He has been having more difficulty ambulating because of the tremors.  No fevers there is question of some urinary discomfort.  No vomiting or diarrhea.  Patient denies any specific complaints.  Home Medications Prior to Admission medications   Medication Sig Start Date End Date Taking? Authorizing Provider  aMILoride (MIDAMOR) 5 MG tablet Take 5 mg by mouth daily.    [provider]  amLODipine (NORVASC) 5 MG tablet Take 5 mg by mouth daily.    [provider]  ciclopirox (PENLAC) 8 % solution Apply 1 application  topically See admin instructions. Apply over affected nails and surrounding skin. Apply daily over previous coat. After seven (7) days, may remove with alcohol and continue cycle.    [provider]  divalproex (DEPAKOTE ER) 500 MG 24 hr tablet Take 2 tablets (1,000 mg total) by mouth at bedtime. 04/28/21   Jacalyn Lefevre, MD  Ensure (ENSURE) Take 1 Can by mouth 2 (two) times daily as needed (if 50% of meal is not consumed).    [provider]  haloperidol (HALDOL) 20 MG tablet Take 10-20 mg by mouth See admin instructions.  Take 10 mg by mouth in the morning and 20 mg at noon and bedtime    [provider]  lactulose (CHRONULAC) 10 GM/15ML solution Take 30 g by mouth See admin instructions. Take 30 grams by mouth two times a day and an additional 30 grams once a day as needed for ongoing constipation    [provider]  lithium carbonate 150 MG capsule Take 150-300 mg by mouth See admin instructions. Take 150 mg by mouth in the morning and 300 mg at bedtime    [provider]  Melatonin 10 MG CAPS Take 10 mg by mouth at bedtime.    [provider]  OVER THE COUNTER MEDICATION Place 1 enema rectally See admin instructions. Warm-tap water enema: Place 1 enema rectally once a day as needed for constipation    [provider]  pantoprazole (PROTONIX) 40 MG tablet Take 1 tablet (40 mg total) by mouth in the morning and at bedtime. 02/14/22 11/08/22  [place], Eric J, DO  patiromer (VELTASSA) 8.4 g packet Take 8.4 g by mouth daily.    [provider]  polyethylene glycol powder (GLYCOLAX/MIRALAX) 17 GM/SCOOP powder Take 17 g by mouth in the morning and at bedtime.    [provider]  propranolol (INDERAL) 10 MG tablet Take 10 mg by mouth 3 (three) times daily.    [provider]  QUEtiapine (SEROQUEL) 400 MG tablet Take 400  mg by mouth in the morning and at bedtime.    [provider]  sennosides-docusate sodium (SENOKOT-S) 8.6-50 MG tablet Take 2 tablets by mouth in the morning.    [provider]  simethicone (MYLICON) 125 MG chewable tablet Chew 125 mg by mouth every 6 (six) hours as needed for flatulence.    [provider]  sodium phosphate (FLEET) 7-19 GM/118ML ENEM Place 133 mLs (1 enema total) rectally daily as needed for moderate constipation (constipation). Patient not taking: Reported on 11/08/2022 02/14/22   [place], Alvira Philips, DO  valbenazine [facility]) 40 MG capsule Take 1 capsule (40 mg total) by mouth daily. 01/16/22    Starkes-Perry, Juel Burrow, FNP  Vitamin D, Ergocalciferol, (DRISDOL) 1.25 MG (50000 UNIT) CAPS capsule Take 1 capsule (50,000 Units total) by mouth every 7 (seven) days. 01/11/22   Leatha Gilding, MD  zolpidem (AMBIEN) 10 MG tablet Take 10 mg by mouth at bedtime. 06/21/20   [provider]      Allergies    Benzodiazepines and Clozapine    Review of Systems   Review of Systems  Genitourinary:  Positive for frequency.    Physical Exam Updated Vital Signs BP 126/78 (BP Location: Right Arm)   Pulse 64   Temp 98.4 F (36.9 C) (Oral)   Resp 20   Ht 1.727 m (5\' 8" )   Wt 63.5 kg   SpO2 95%   BMI 21.29 kg/m  Physical Exam Vitals and nursing note reviewed.  Constitutional:      Appearance: He is well-developed. He is not diaphoretic.  HENT:     Head: Normocephalic and atraumatic.     Right Ear: External ear normal.     Left Ear: External ear normal.  Eyes:     General: No scleral icterus.       Right eye: No discharge.        Left eye: No discharge.     Conjunctiva/sclera: Conjunctivae normal.  Neck:     Trachea: No tracheal deviation.  Cardiovascular:     Rate and Rhythm: Normal rate and regular rhythm.  Pulmonary:     Effort: Pulmonary effort is normal. No respiratory distress.     Breath sounds: Normal breath sounds. No stridor. No wheezing or rales.  Abdominal:     General: Bowel sounds are normal. There is no distension.     Palpations: Abdomen is soft.     Tenderness: There is no abdominal tenderness. There is no guarding or rebound.  Musculoskeletal:        General: No tenderness or deformity.     Cervical back: Neck supple.  Skin:    General: Skin is warm and dry.     Findings: No rash.  Neurological:     General: No focal deficit present.     Mental Status: He is alert.     Cranial Nerves: No cranial nerve deficit, dysarthria or facial asymmetry.     Sensory: No sensory deficit.     Motor: Tremor present. No weakness, abnormal muscle tone or seizure  activity.     Coordination: Coordination normal.     Comments: Strong grip strength bilaterally, able to lift both legs off the bed, strong plantarflexion strength, coarse tremor noted that increases with movement  Psychiatric:        Mood and Affect: Mood normal.     ED Results / Procedures / Treatments   Labs (all labs ordered are listed, but only abnormal results are displayed) Labs  Reviewed  CBC  BASIC METABOLIC PANEL  URINALYSIS, W/ REFLEX TO CULTURE (INFECTION SUSPECTED)    EKG None  Radiology CT Head Wo Contrast  Result Date: 11/08/2022 CLINICAL DATA:  Mental status change, unknown cause. New onset tremors and unsteady gait. EXAM: CT HEAD WITHOUT CONTRAST TECHNIQUE: Contiguous axial images were obtained from the base of the skull through the vertex without intravenous contrast. RADIATION DOSE REDUCTION: This exam was performed according to the departmental dose-optimization program which includes automated exposure control, adjustment of the mA and/or kV according to patient size and/or use of iterative reconstruction technique. COMPARISON:  01/03/2022. FINDINGS: Brain: No acute intracranial hemorrhage, midline shift or mass effect. No extra-axial fluid collection. Gray-white matter differentiation is within normal limits. No hydrocephalus. Vascular: No hyperdense vessel or unexpected calcification. Skull: Normal. Negative for fracture or focal lesion. Sinuses/Orbits: No acute finding. Other: None. IMPRESSION: No acute intracranial process. Electronically Signed   By: Thornell Sartorius M.D.   On: 11/08/2022 20:07    Procedures Procedures  {Document cardiac monitor, telemetry assessment procedure when appropriate:1}  Medications Ordered in ED Medications  aMILoride (MIDAMOR) tablet 5 mg (has no administration in time range)  amLODipine (NORVASC) tablet 5 mg (has no administration in time range)  divalproex (DEPAKOTE ER) 24 hr tablet 1,000 mg (has no administration in time range)   haloperidol (HALDOL) tablet 20 mg (has no administration in time range)  lithium carbonate capsule 150-300 mg (has no administration in time range)  patiromer Lelon Perla) packet 8.4 g (has no administration in time range)  propranolol (INDERAL) tablet 10 mg (has no administration in time range)  QUEtiapine (SEROQUEL) tablet 400 mg (has no administration in time range)    ED Course/ Medical Decision Making/ A&P   {   Click here for ABCD2, HEART and other calculatorsREFRESH Note before signing :1}                          Medical Decision Making Amount and/or Complexity of Data Reviewed Labs: ordered.  Risk Prescription drug management.   ***  {Document critical care time when appropriate:1} {Document review of labs and clinical decision tools ie heart score, Chads2Vasc2 etc:1}  {Document your independent review of radiology images, and any outside records:1} {Document your discussion with family members, caretakers, and with consultants:1} {Document social determinants of health affecting pt's care:1} {Document your decision making why or why not admission, treatments were needed:1} Final Clinical Impression(s) / ED Diagnoses Final diagnoses:  None    Rx / DC Orders ED Discharge Orders     None

## 2022-11-10 LAB — URINALYSIS, W/ REFLEX TO CULTURE (INFECTION SUSPECTED)
Bacteria, UA: NONE SEEN
Bilirubin Urine: NEGATIVE
Glucose, UA: NEGATIVE mg/dL
Hgb urine dipstick: NEGATIVE
Ketones, ur: NEGATIVE mg/dL
Leukocytes,Ua: NEGATIVE
Nitrite: NEGATIVE
Protein, ur: NEGATIVE mg/dL
Specific Gravity, Urine: 1.011 (ref 1.005–1.030)
pH: 6 (ref 5.0–8.0)

## 2022-11-10 MED ORDER — DIVALPROEX SODIUM ER 500 MG PO TB24: 1000.0000 mg | ORAL_TABLET | Freq: Every day | ORAL | 0 refills | Status: AC

## 2022-11-10 NOTE — ED Provider Notes (Signed)
I assumed care of this patient from previous provider.  Please see their note for further details of history, exam, and MDM.   Briefly patient is a 61 y.o. male who presented for tremor and urinary frequency. Pending UA.  UA negative.  The patient appears reasonably screened and/or stabilized for discharge and I doubt any other medical condition or other [facility] requiring further screening, evaluation, or treatment in the ED at this time. I have discussed the findings, Dx and Tx plan with the patient/family who expressed understanding and agree(s) with the plan. Discharge instructions discussed at length. The patient/family was given strict return precautions who verbalized understanding of the instructions. No further questions at time of discharge.  Disposition: Discharge  Condition: Good  ED Discharge Orders          Ordered    divalproex (DEPAKOTE ER) 500 MG 24 hr tablet  Daily at bedtime        11/10/22 0105              Follow Up: Francella Solian, MD [address] [place] [address] [phone]  Call  to schedule an appointment for close follow up          Jeralynn Vaquera, Amadeo Garnet, MD 11/10/22 0246

## 2022-11-11 LAB — URINE CULTURE: Culture: 20000 — AB

## 2022-11-12 LAB — URINE CULTURE

## 2023-03-21 IMAGING — CT CT HEAD W/O CM
3 series · 15 of 47 positions shown, 18 images · non-contrast
Comparison: Prior head CT 06/28/2021.

CLINICAL DATA: Neuro deficit, acute, stroke suspected. Neck trauma,
intoxicated or up tended. Additional history provided: Recent falls.



[Series 3: head wo · axial · 0.47mm/px · z∈[-179,-29]mm · 9 of 36 slices shown, 12 images]
[im 3/36  brain]
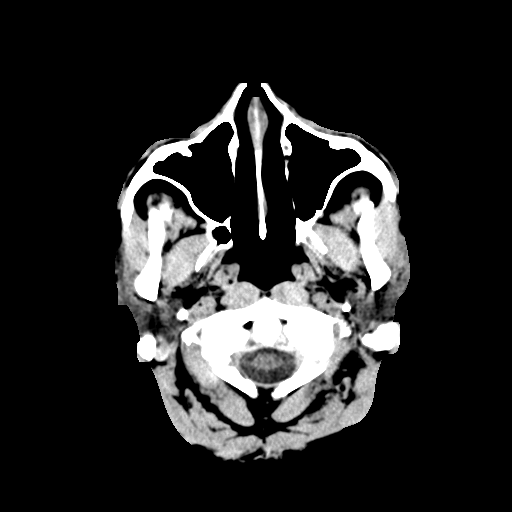
[im 3/36  bone]
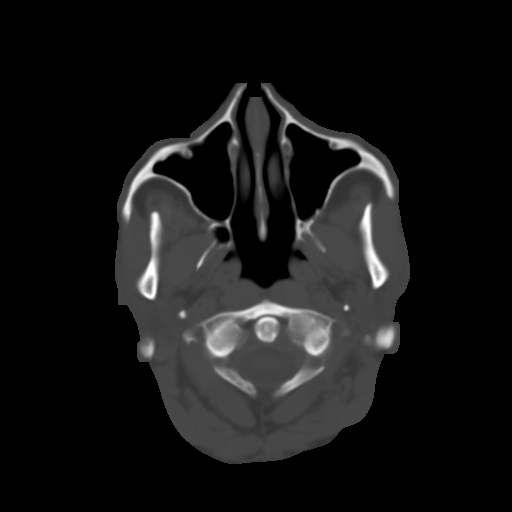
[im 7/36  brain]
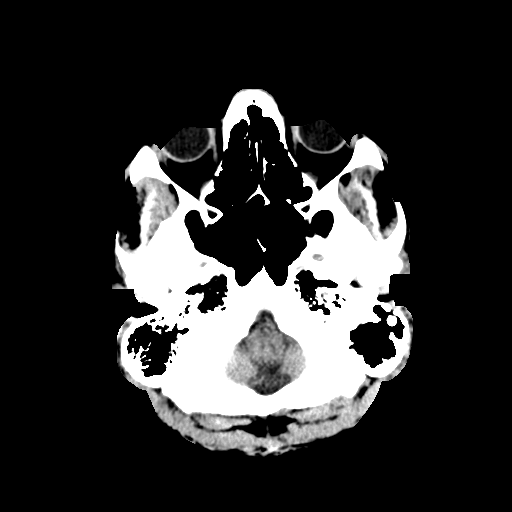
[im 10/36  brain]
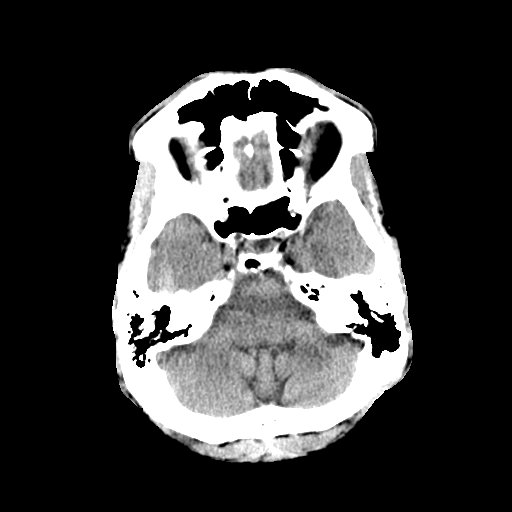
[im 14/36  brain]
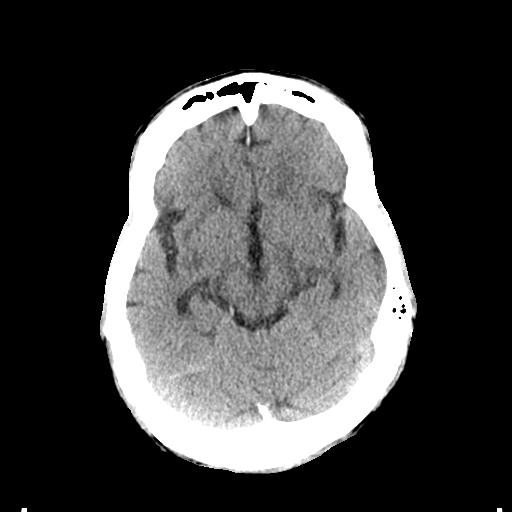
[im 19/36  brain]
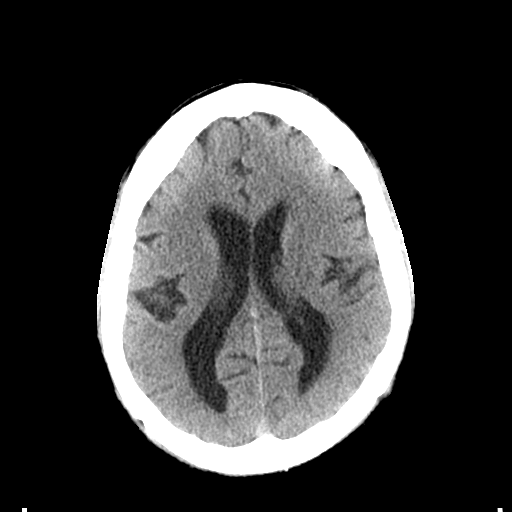
[im 19/36  bone]
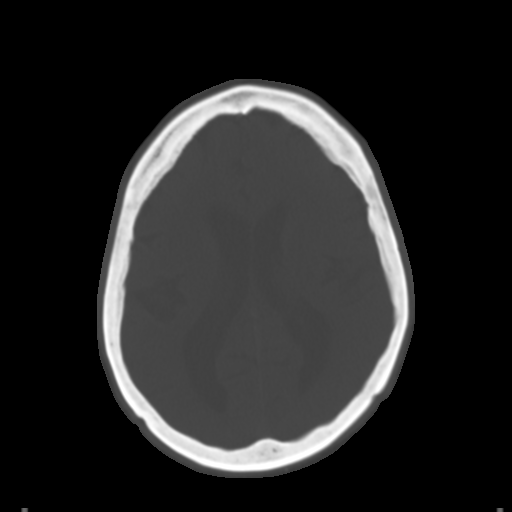
[im 22/36  brain]
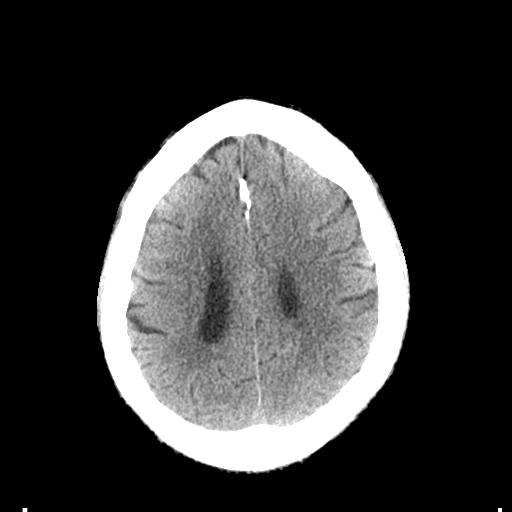
[im 26/36  brain]
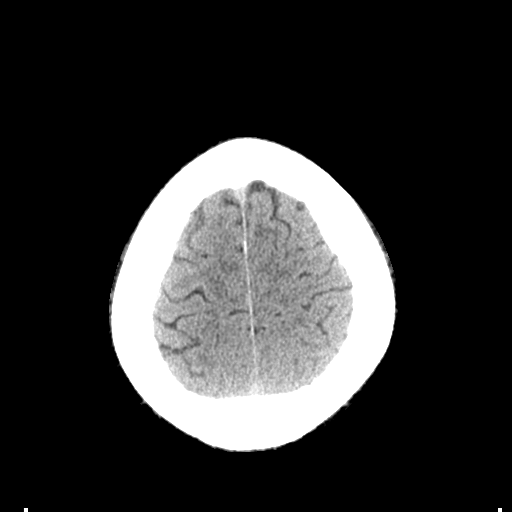
[im 29/36  brain]
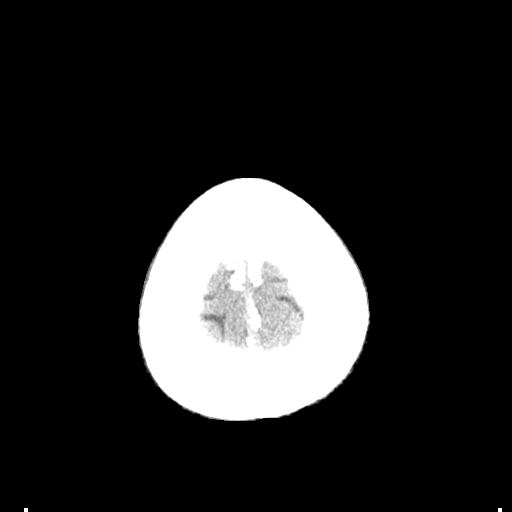
[im 33/36  brain]
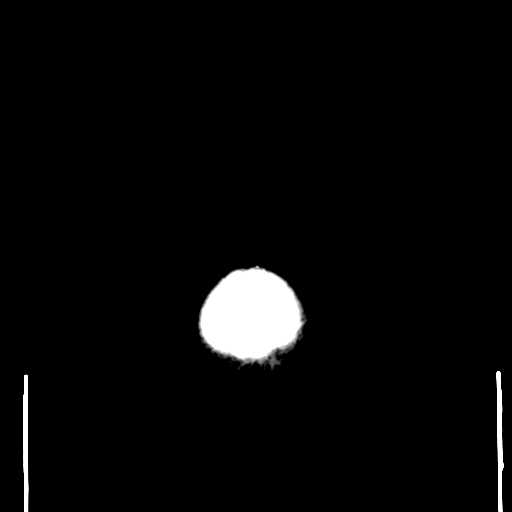
[im 33/36  bone]
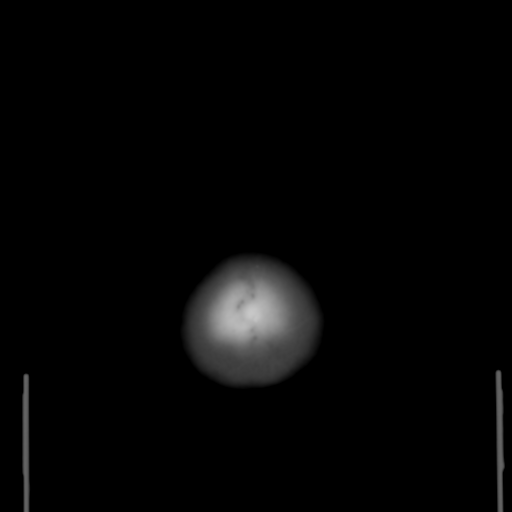

[Series 6: coronal soft tissue · coronal · 0.35mm/px · 3 of 72 slices shown]
[im 24/72  brain]
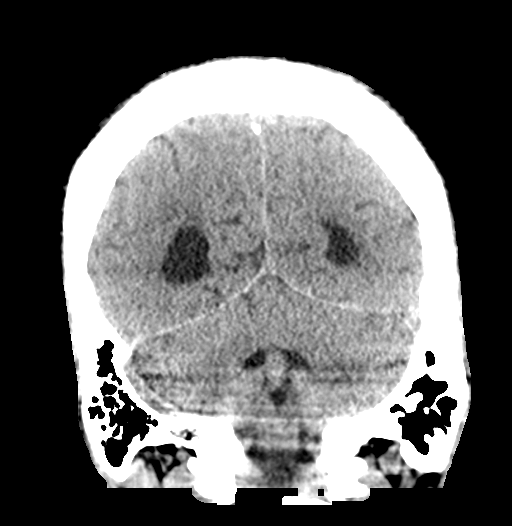
[im 32/72  brain]
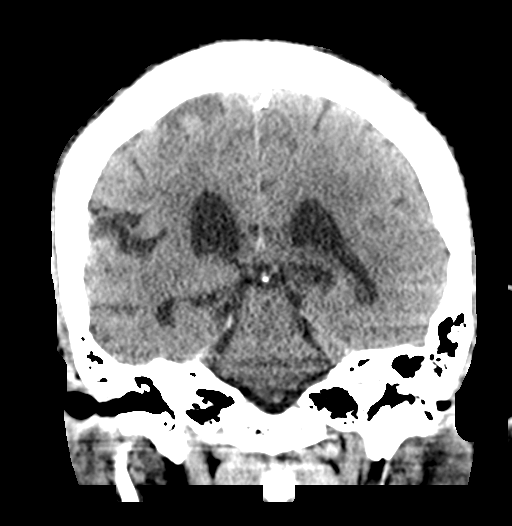
[im 40/72  brain]
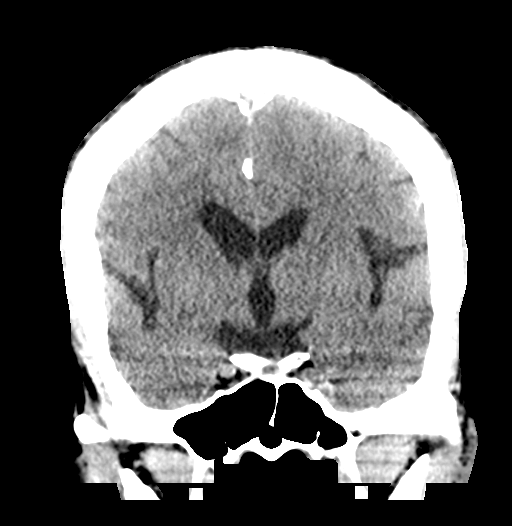

[Series 7: sagittal soft tissue · sagittal · 0.36mm/px · 3 of 58 slices shown]
[im 20/58  brain]
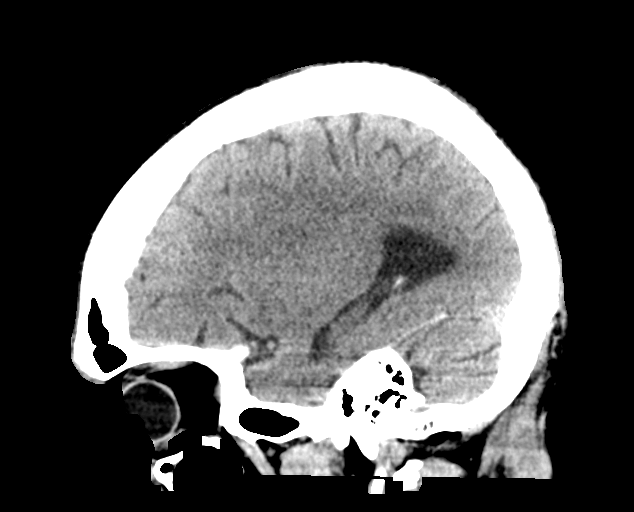
[im 29/58  brain]
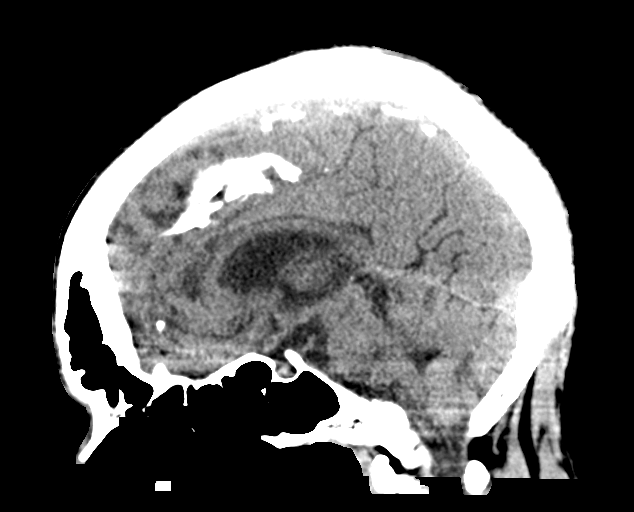
[im 39/58  brain]
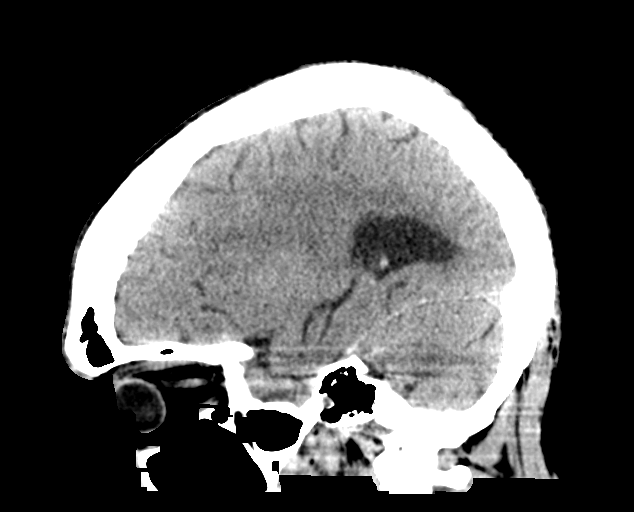

[15 of 47 positions shown; findings below may reference images not displayed]

FINDINGS: CT HEAD FINDINGS

Brain:

Mild cerebral atrophy.

There is no acute intracranial hemorrhage.

No demarcated cortical infarct.

No extra-axial fluid collection.

No evidence of an intracranial mass.

No midline shift.

Vascular: No hyperdense vessel.

Skull: Normal. Negative for fracture or focal lesion.

Sinuses/Orbits: Visualized orbits show no acute finding. No
significant paranasal sinus disease at the imaged levels.

CT CERVICAL SPINE FINDINGS

Alignment: No significant spondylolisthesis.

Skull base and vertebrae: The basion-dental and atlanto-dental
intervals are maintained.No evidence of acute fracture to the
cervical spine. Congenital nonunion of the posterior arch of C1.
Bilateral facet joint ankylosis at C4-C5.

Soft tissues and spinal canal: No prevertebral fluid or swelling. No
visible canal hematoma.

Disc levels: Cervical and upper thoracic spondylosis with multilevel
disc space narrowing, disc bulges, uncovertebral hypertrophy and
facet arthrosis. Disc space narrowing is greatest at C2-C3, C3-C4,
C7-T1, T1-T2 and T2-T3. No appreciable high-grade spinal canal
stenosis. Multilevel bony neural foraminal narrowing.

Upper chest: No consolidation within the imaged lung apices. No
visible pneumothorax.
IMPRESSION: CT head:

1. No evidence of acute intracranial abnormality.
2. Mild cerebral atrophy.

CT cervical spine:

1. No evidence of acute fracture to the cervical spine.
2. Cervical spondylosis, as described.

## 2023-03-21 IMAGING — MR MR HEAD W/O CM
9 series · 48 of 48 positions shown · non-contrast
Comparison: 06/28/2021 head CT

CLINICAL DATA: Acute neurologic deficit

EXAM:
MRI HEAD WITHOUT CONTRAST
TECHNIQUE: Multiplanar, multiecho pulse sequences of the brain and surrounding
structures were obtained without intravenous contrast.

[Series 9: dwi_tracew · axial · 3.0mm · 1.08mm/px · z∈[-67,+82]mm · 12 of 102 slices shown]
[im 1/102]
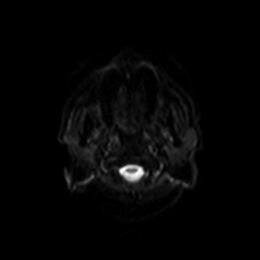
[im 10/102]
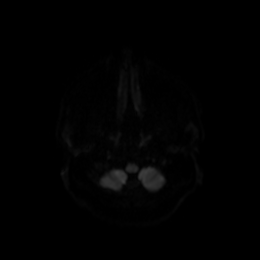
[im 19/102]
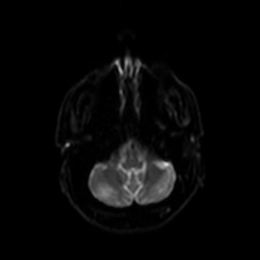
[im 28/102]
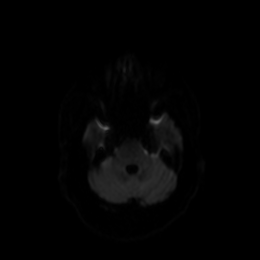
[im 37/102]
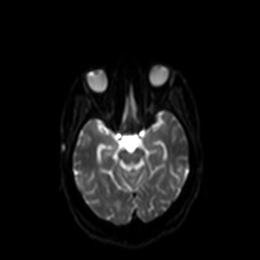
[im 46/102]
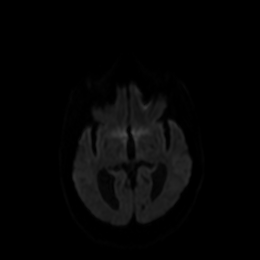
[im 56/102]
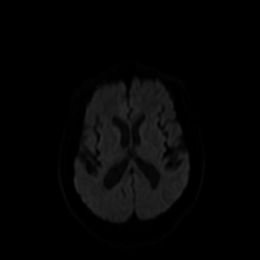
[im 65/102]
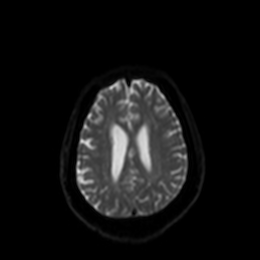
[im 74/102]
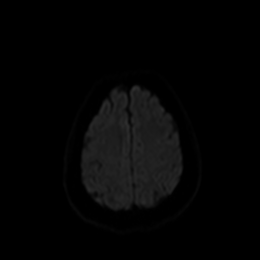
[im 83/102]
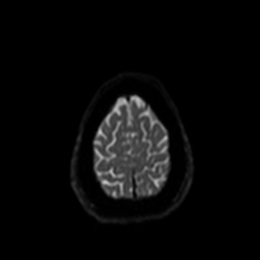
[im 92/102]
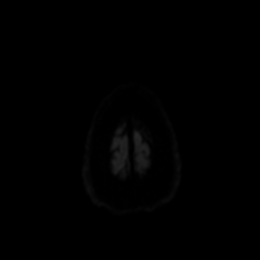
[im 102/102]
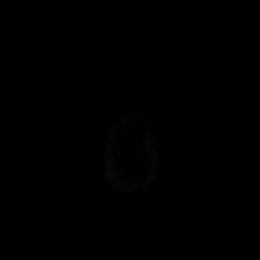

[Series 10: dwi_adc · axial · 3.0mm · 1.08mm/px · z∈[-67,+82]mm · 6 of 51 slices shown]
[im 1/51]
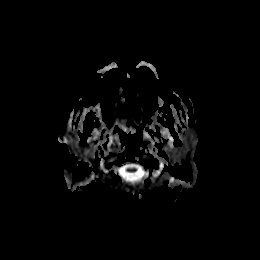
[im 11/51]
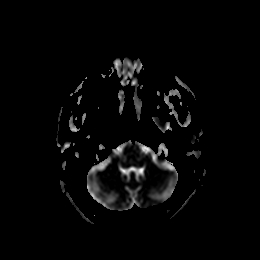
[im 21/51]
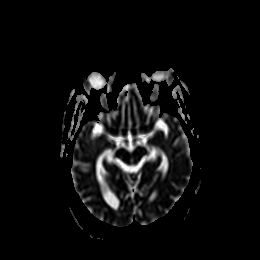
[im 31/51]
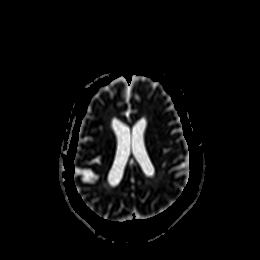
[im 41/51]
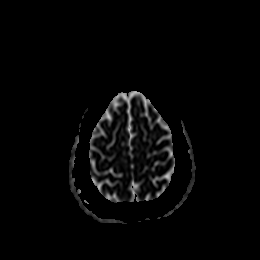
[im 51/51]
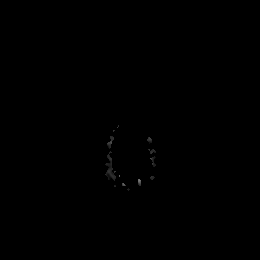

[Series 11: T1 · sagittal · 5.0mm · 0.75mm/px · 3 of 22 slices shown]
[im 1/22]
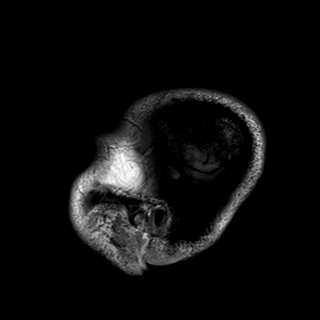
[im 11/22]
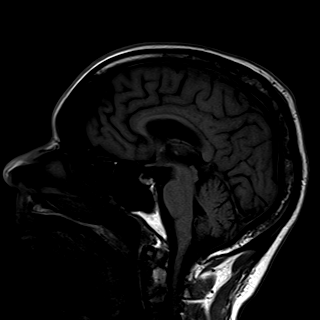
[im 22/22]
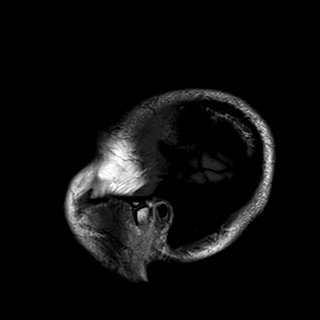

[Series 12: T2 · axial · 5.0mm · 0.45mm/px · z∈[-61,+80]mm · 3 of 23 slices shown (1 of 2)]
[im 1/23]
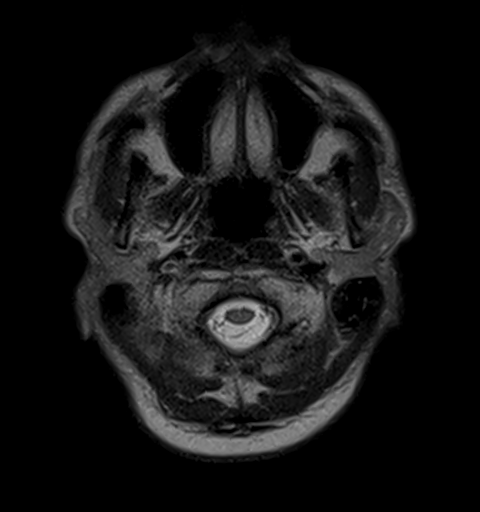
[im 12/23]
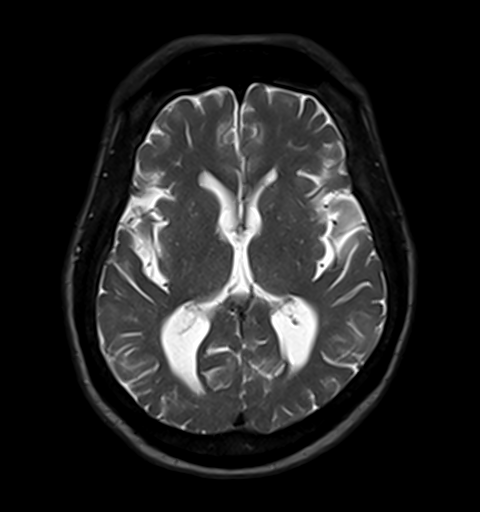
[im 23/23]
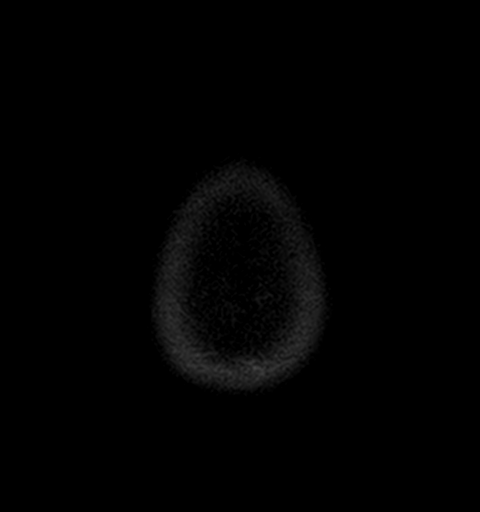

[Series 13: GRE · axial · 3.0mm · 0.45mm/px · z∈[-70,+80]mm · 6 of 51 slices shown]
[im 1/51]
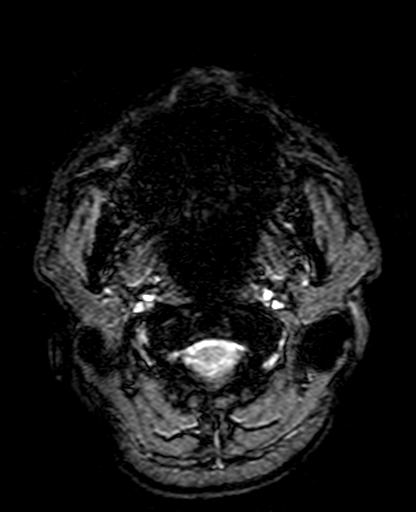
[im 11/51]
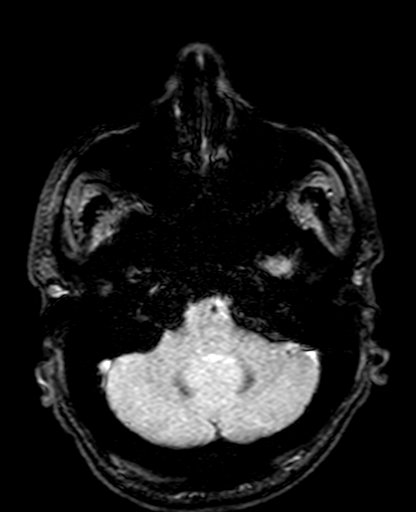
[im 21/51]
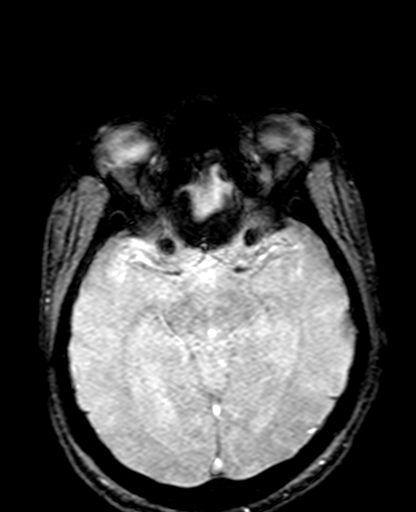
[im 31/51]
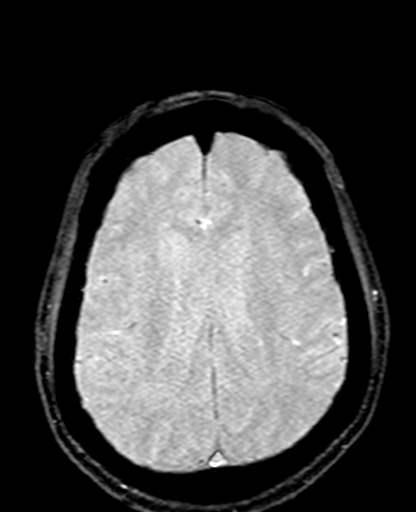
[im 41/51]
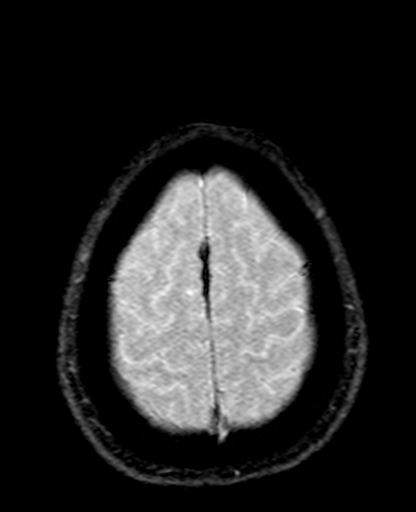
[im 51/51]
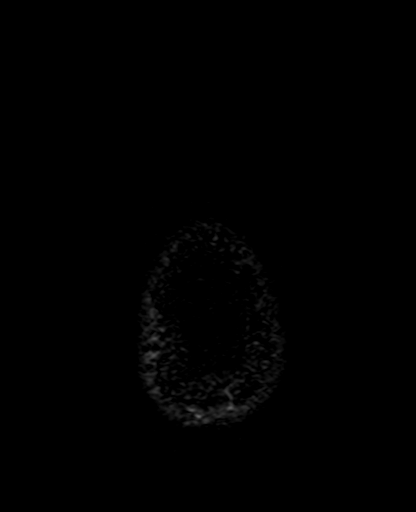

[Series 14: FLAIR · axial · 3.0mm · 0.75mm/px · z∈[-66,+86]mm · 6 of 52 slices shown]
[im 1/52]
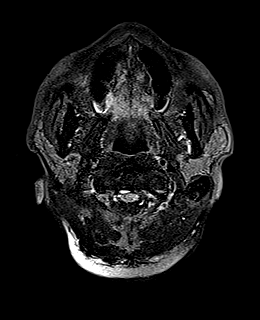
[im 11/52]
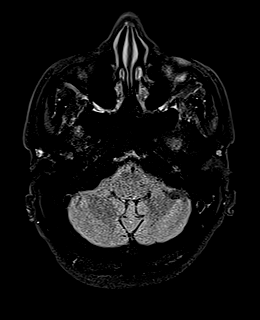
[im 21/52]
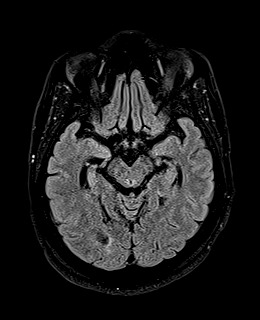
[im 31/52]
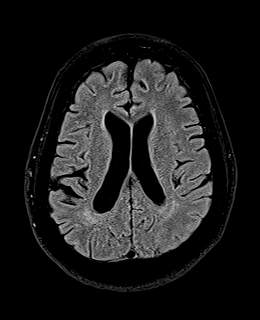
[im 41/52]
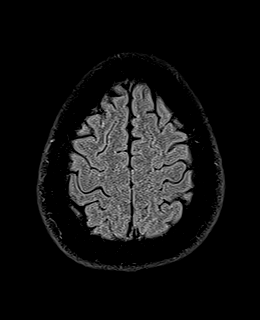
[im 52/52]
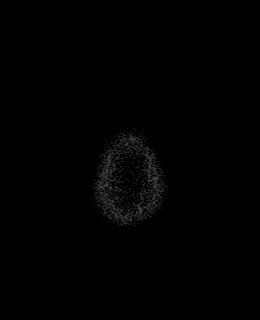

[Series 15: DWI · coronal · 5.0mm · 1.31mm/px · 6 of 52 slices shown (1 of 2)]
[im 1/52]
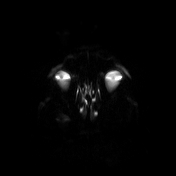
[im 11/52]
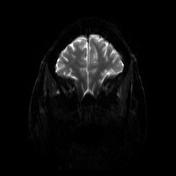
[im 21/52]
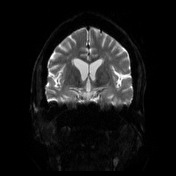
[im 31/52]
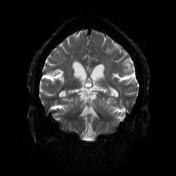
[im 41/52]
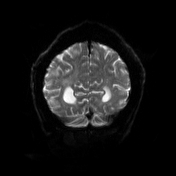
[im 52/52]
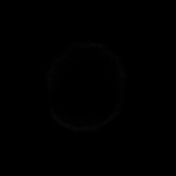

[Series 16: DWI · coronal · 5.0mm · 1.31mm/px · 3 of 26 slices shown (2 of 2)]
[im 1/26]
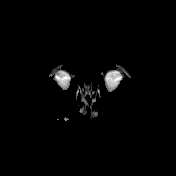
[im 13/26]
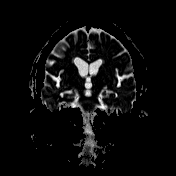
[im 26/26]
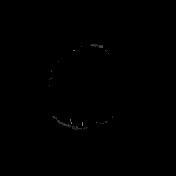

[Series 22: T2 · coronal · 5.0mm · 0.86mm/px · 3 of 25 slices shown (2 of 2)]
[im 1/25]
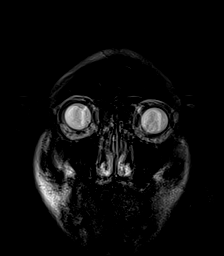
[im 13/25]
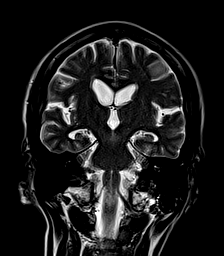
[im 25/25]
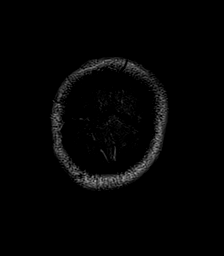

[48 of 48 positions shown; findings below may reference images not displayed]

FINDINGS: Brain: No acute infarct, mass effect or extra-axial collection. No
acute or chronic hemorrhage. There is multifocal hyperintense
T2-weighted signal within the white matter. Generalized volume loss
without a clear lobar predilection. The midline structures are
normal.

Vascular: Major flow voids are preserved.

Skull and upper cervical spine: Normal calvarium and skull base.
Visualized upper cervical spine and soft tissues are normal.

Sinuses/Orbits:No paranasal sinus fluid levels or advanced mucosal
thickening. No mastoid or middle ear effusion. Normal orbits.
IMPRESSION: 1. No acute intracranial abnormality.
2. Findings of chronic microvascular ischemia and generalized volume
loss.

## 2023-05-22 IMAGING — DX DG CHEST 1V PORT
1 series · 1 of 1 positions shown · non-contrast
Comparison: Chest radiograph dated 07/03/2021 and CT dated
10/04/2021.

CLINICAL DATA: Hypertensive emergency.

EXAM:
PORTABLE CHEST 1 VIEW

[chest ap]
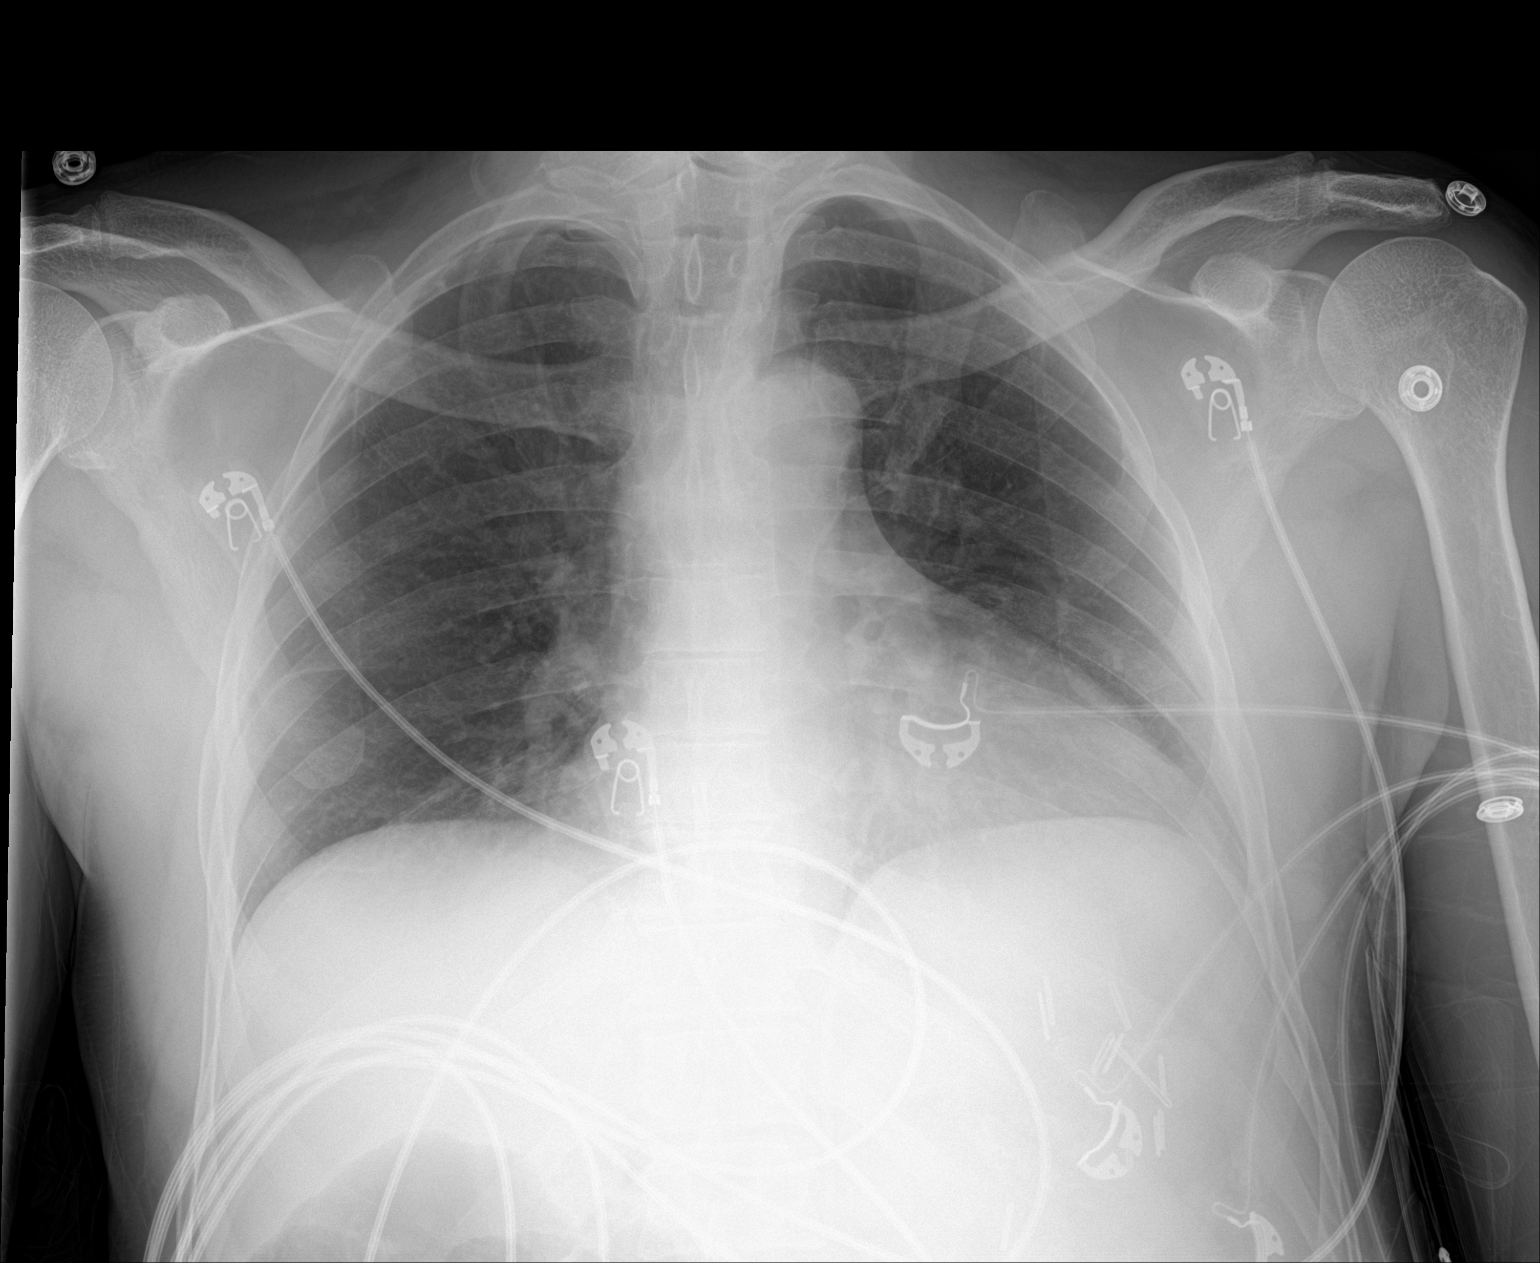

[1 of 1 positions shown; findings below may reference images not displayed]

FINDINGS: Shallow inspiration. No focal consolidation, pleural effusion, or
pneumothorax. Stable cardiomediastinal silhouette. No acute osseous
pathology.
IMPRESSION: No active disease.

## 2023-07-05 ENCOUNTER — Ambulatory Visit: Payer: MEDICAID | Attending: Physician Assistant | Admitting: Speech Pathology

## 2024-03-04 DEATH — deceased
